# Patient Record
Sex: Male | Born: 1995 | Race: White | Hispanic: No | Marital: Married | Smoking: Never smoker
Health system: Southern US, Community
[De-identification: ages and names within clinical notes are randomized; demographics above are authoritative.]

## PROBLEM LIST (undated history)

## (undated) DIAGNOSIS — K589 Irritable bowel syndrome without diarrhea: Secondary | ICD-10-CM

## (undated) DIAGNOSIS — F419 Anxiety disorder, unspecified: Secondary | ICD-10-CM

## (undated) DIAGNOSIS — E669 Obesity, unspecified: Secondary | ICD-10-CM

## (undated) DIAGNOSIS — E039 Hypothyroidism, unspecified: Secondary | ICD-10-CM

## (undated) DIAGNOSIS — K219 Gastro-esophageal reflux disease without esophagitis: Secondary | ICD-10-CM

## (undated) DIAGNOSIS — G4730 Sleep apnea, unspecified: Secondary | ICD-10-CM

## (undated) DIAGNOSIS — F32A Depression, unspecified: Secondary | ICD-10-CM

## (undated) DIAGNOSIS — E739 Lactose intolerance, unspecified: Secondary | ICD-10-CM

## (undated) DIAGNOSIS — F329 Major depressive disorder, single episode, unspecified: Secondary | ICD-10-CM

## (undated) DIAGNOSIS — M2550 Pain in unspecified joint: Secondary | ICD-10-CM

## (undated) DIAGNOSIS — M1990 Unspecified osteoarthritis, unspecified site: Secondary | ICD-10-CM

## (undated) HISTORY — DX: Sleep apnea, unspecified: G47.30

## (undated) HISTORY — PX: KNEE ARTHROSCOPY, MEDIAL PATELLO FEMORAL LIGAMENT REPAIR: SHX1890

## (undated) HISTORY — DX: Irritable bowel syndrome, unspecified: K58.9

## (undated) HISTORY — DX: Hypothyroidism, unspecified: E03.9

## (undated) HISTORY — PX: TONSILLECTOMY AND ADENOIDECTOMY: SUR1326

## (undated) HISTORY — DX: Depression, unspecified: F32.A

## (undated) HISTORY — DX: Gastro-esophageal reflux disease without esophagitis: K21.9

## (undated) HISTORY — DX: Anxiety disorder, unspecified: F41.9

## (undated) HISTORY — DX: Lactose intolerance, unspecified: E73.9

## (undated) HISTORY — DX: Obesity, unspecified: E66.9

## (undated) HISTORY — DX: Pain in unspecified joint: M25.50

## (undated) HISTORY — DX: Unspecified osteoarthritis, unspecified site: M19.90

---
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
## 2017-11-10 ENCOUNTER — Ambulatory Visit (INDEPENDENT_AMBULATORY_CARE_PROVIDER_SITE_OTHER): Payer: 59 | Admitting: Family Medicine

## 2017-11-10 ENCOUNTER — Encounter: Payer: Self-pay | Admitting: Family Medicine

## 2017-11-10 VITALS — BP 118/78 | HR 85 | Temp 98.3°F | Resp 14 | Ht 71.0 in | Wt 295.0 lb

## 2017-11-10 DIAGNOSIS — K219 Gastro-esophageal reflux disease without esophagitis: Secondary | ICD-10-CM | POA: Diagnosis not present

## 2017-11-10 DIAGNOSIS — R197 Diarrhea, unspecified: Secondary | ICD-10-CM | POA: Diagnosis not present

## 2017-11-10 DIAGNOSIS — F321 Major depressive disorder, single episode, moderate: Secondary | ICD-10-CM | POA: Diagnosis not present

## 2017-11-10 DIAGNOSIS — R5383 Other fatigue: Secondary | ICD-10-CM

## 2017-11-10 DIAGNOSIS — R6882 Decreased libido: Secondary | ICD-10-CM | POA: Diagnosis not present

## 2017-11-10 DIAGNOSIS — F419 Anxiety disorder, unspecified: Secondary | ICD-10-CM | POA: Diagnosis not present

## 2017-11-10 DIAGNOSIS — E039 Hypothyroidism, unspecified: Secondary | ICD-10-CM | POA: Insufficient documentation

## 2017-11-10 LAB — TSH: TSH: 4.53 mIU/L — ABNORMAL HIGH (ref 0.40–4.50)

## 2017-11-10 MED ORDER — OMEPRAZOLE 20 MG PO CPDR: 20.0000 mg | DELAYED_RELEASE_CAPSULE | Freq: Every day | ORAL | 3 refills | Status: DC

## 2017-11-10 MED ORDER — CITALOPRAM HYDROBROMIDE 20 MG PO TABS: 40.0000 mg | ORAL_TABLET | Freq: Every day | ORAL | 3 refills | Status: DC

## 2017-11-10 NOTE — Progress Notes (Signed)
Subjective:  Kent Griffith is a 22 y.o. male who presents today with a chief complaint of hypothyroidism and to establish care.   HPI:  Hypothyroidism, chronic problem, new to provider Severe history.  Currently on Armour Thyroid 120 mg daily.  He has been on Synthroid in the past however states he is not able to tolerate this due to weight gain.  Would like to have a thyroid level checked today.  Does not notice any significant side effects from Armour Thyroid.    Decreased libido, chronic problem, new to provider Started about 5 months ago.  No clear precipitating events.  No dysuria.  No penile discharge.  No erectile dysfunction.  Diarrhea, chronic problem, new to provider Several year history.  Stable over the last several months.  Patient notes that he frequently has loose stools.  Very frequently gets a lot of gas pains.  He has tried taking over-the-counter simethicone which helps a little bit.  Notices that lactose worsens his symptoms, however does not notice any other specific foods that worsen his symptoms.  Pain sometimes gets better after bowel movements.  He has had some GI evaluation in the past including a HIDA scan which was "borderline".  No other treatments tried.  No fevers.  No melena or hematochezia.  Depression/Anxiety, new problems Patient reports that he has had anxiety for most of his life.  He has never been on any medications for this.  Also occasionally has depressive type symptoms that seem to be more situational and seasonal.  He has never been on any medications for either of these problems.  He has never spoke to a physician about any of these problems either.  Depression screen PHQ 2/9 11/10/2017  Decreased Interest 1  Down, Depressed, Hopeless 1  PHQ - 2 Score 2  Altered sleeping 2  Tired, decreased energy 3  Change in appetite 3  Feeling bad or failure about yourself  3  Trouble concentrating 0  Moving slowly or fidgety/restless 0  Suicidal thoughts 0    PHQ-9 Score 13  Difficult doing work/chores Not difficult at all   GAD 7 : Generalized Anxiety Score 11/10/2017  Nervous, Anxious, on Edge 3  Control/stop worrying 3  Worry too much - different things 3  Trouble relaxing 0  Restless 3  Easily annoyed or irritable 3  Afraid - awful might happen 0  Total GAD 7 Score 15  Anxiety Difficulty Not difficult at all   ROS: Per HPI, otherwise a complete review of systems was negative.   PMH:  The following were reviewed and entered/updated in epic: Past Medical History:  Diagnosis Date  . Hypothyroidism    Patient Active Problem List   Diagnosis Date Noted  . Hypothyroidism 11/10/2017  . GERD (gastroesophageal reflux disease) 11/10/2017  . Depression, major, single episode, moderate (HCC) 11/10/2017  . Anxiety 11/10/2017  . Diarrhea 11/10/2017   Past Surgical History:  Procedure Laterality Date  . KNEE ARTHROSCOPY, MEDIAL PATELLO FEMORAL LIGAMENT REPAIR Right   . TONSILLECTOMY AND ADENOIDECTOMY      Family History  Problem Relation Age of Onset  . Depression Mother   . Suicidality Mother   . Alcohol abuse Father     Medications- reviewed and updated Current Outpatient Medications  Medication Sig Dispense Refill  . citalopram (CELEXA) 20 MG tablet Take 2 tablets (40 mg total) by mouth daily. 60 tablet 3  . omeprazole (PRILOSEC) 20 MG capsule Take 1 capsule (20 mg total) by mouth daily.  90 capsule 3   No current facility-administered medications for this visit.     Allergies-reviewed and updated Allergies  Allergen Reactions  . Penicillins Anaphylaxis, Hives, Itching and Rash    Social History   Socioeconomic History  . Marital status: Married    Spouse name: Not on file  . Number of children: Not on file  . Years of education: Not on file  . Highest education level: Not on file  Occupational History  . Not on file  Social Needs  . Financial resource strain: Not on file  . Food insecurity:    Worry: Not on  file    Inability: Not on file  . Transportation needs:    Medical: Not on file    Non-medical: Not on file  Tobacco Use  . Smoking status: Never Smoker  . Smokeless tobacco: Never Used  Substance and Sexual Activity  . Alcohol use: Yes    Comment: occ.  . Drug use: Not on file  . Sexual activity: Not on file  Lifestyle  . Physical activity:    Days per week: Not on file    Minutes per session: Not on file  . Stress: Not on file  Relationships  . Social connections:    Talks on phone: Not on file    Gets together: Not on file    Attends religious service: Not on file    Active member of club or organization: Not on file    Attends meetings of clubs or organizations: Not on file    Relationship status: Not on file  Other Topics Concern  . Not on file  Social History Narrative  . Not on file   Objective:  Physical Exam: BP 118/78   Pulse 85   Temp 98.3 F (36.8 C) (Oral)   Resp 14   Ht  (1.803 m)   Wt 295 lb (133.8 kg)   SpO2 95%   BMI 41.14 kg/m   Gen: NAD, resting comfortably HEENT: No thyromegaly  CV: RRR with no murmurs appreciated Pulm: NWOB, CTAB with no crackles, wheezes, or rhonchi GI: Normal bowel sounds present. Soft, Nontender, Nondistended. MSK: No edema, cyanosis, or clubbing noted Skin: Warm, dry Neuro: Grossly normal, moves all extremities Psych: Normal affect and thought content  Assessment/Plan:  Hypothyroidism Check TSH today.  Continue current dose of Armour Thyroid.  Instructed patient we can continue with his current dose of Armour Thyroid as long as his TSH remains within normal range.  If not, we will need to switch to Synthroid.  GERD (gastroesophageal reflux disease) Stable.  Continue omeprazole 20 mg daily.  Consider trial of H2 blocker in the future.  Depression, major, single episode, moderate (HCC) PHQ 9 score significantly elevated today.  Discussed treatment options with patient.  We will start Celexa 20 mg daily and  increase to 40 mg daily within 1 to 2 weeks if he tolerates well without side effects.  Discussed possible side effects of medication.  He will follow-up with me in 4 to 6 weeks, or sooner as needed.  Anxiety GAD 7 score significantly elevated today.  See depression A/P.  Will start Celexa.  He will follow-up with me in 4 to 6 weeks.  Diarrhea Symptoms possibly consistent with irritable bowel syndrome.  No signs of systemic illness or inflammatory bowel disease.  Will check blood work today including TSH and CMET to rule out other possible causes.  May have some improvement with SSRI for his depression and anxiety.  Advised patient to keep a food log to track his symptoms.  He will follow-up with me in 4 to 6 weeks.  Consider trial of antispasmodic such as Bentyl in the future.  Decreased libido Likely secondary to anxiety/depression and possibly hypothyroidism.  Will check testosterone level today.  Fatigue Check CBC, CMET, TSH, and testosterone level.  Katina Degree. Jimmey Ralph, MD 11/10/2017 4:44 PM

## 2017-11-10 NOTE — Assessment & Plan Note (Signed)
PHQ 9 score significantly elevated today.  Discussed treatment options with patient.  We will start Celexa 20 mg daily and increase to 40 mg daily within 1 to 2 weeks if he tolerates well without side effects.  Discussed possible side effects of medication.  He will follow-up with me in 4 to 6 weeks, or sooner as needed.

## 2017-11-10 NOTE — Assessment & Plan Note (Signed)
Symptoms possibly consistent with irritable bowel syndrome.  No signs of systemic illness or inflammatory bowel disease.  Will check blood work today including TSH and CMET to rule out other possible causes.  May have some improvement with SSRI for his depression and anxiety.  Advised patient to keep a food log to track his symptoms.  He will follow-up with me in 4 to 6 weeks.  Consider trial of antispasmodic such as Bentyl in the future.

## 2017-11-10 NOTE — Assessment & Plan Note (Addendum)
Stable.  Continue omeprazole 20 mg daily.  Consider trial of H2 blocker in the future.

## 2017-11-10 NOTE — Assessment & Plan Note (Signed)
GAD 7 score significantly elevated today.  See depression A/P.  Will start Celexa.  He will follow-up with me in 4 to 6 weeks.

## 2017-11-10 NOTE — Assessment & Plan Note (Signed)
Check TSH today.  Continue current dose of Armour Thyroid.  Instructed patient we can continue with his current dose of Armour Thyroid as long as his TSH remains within normal range.  If not, we will need to switch to Synthroid.

## 2017-11-10 NOTE — Patient Instructions (Signed)
It was very nice to meet you today!  We will start celexa today. Please take  for the next 1-2 weeks, after that you can increase to  daily.  We will check blood work today.  Please come back to see me in 4-6 weeks, or sooner as needed.  Take care, Dr Jimmey Ralph

## 2017-11-11 LAB — CBC
HCT: 42.8 % (ref 38.5–50.0)
Hemoglobin: 14.6 g/dL (ref 13.2–17.1)
MCH: 27 pg (ref 27.0–33.0)
MCHC: 34.1 g/dL (ref 32.0–36.0)
MCV: 79.3 fL — ABNORMAL LOW (ref 80.0–100.0)
MPV: 11.3 fL (ref 7.5–12.5)
Platelets: 191 10*3/uL (ref 140–400)
RBC: 5.4 10*6/uL (ref 4.20–5.80)
RDW: 13.7 % (ref 11.0–15.0)
WBC: 6.2 10*3/uL (ref 3.8–10.8)

## 2017-11-11 LAB — COMPREHENSIVE METABOLIC PANEL
AG RATIO: 2.1 (calc) (ref 1.0–2.5)
ALKALINE PHOSPHATASE (APISO): 83 U/L (ref 40–115)
ALT: 29 U/L (ref 9–46)
AST: 20 U/L (ref 10–40)
Albumin: 4.7 g/dL (ref 3.6–5.1)
BILIRUBIN TOTAL: 0.5 mg/dL (ref 0.2–1.2)
BUN: 15 mg/dL (ref 7–25)
CALCIUM: 9.4 mg/dL (ref 8.6–10.3)
CO2: 29 mmol/L (ref 20–32)
Chloride: 104 mmol/L (ref 98–110)
Creat: 1.1 mg/dL (ref 0.60–1.35)
Globulin: 2.2 g/dL (calc) (ref 1.9–3.7)
Glucose, Bld: 90 mg/dL (ref 65–99)
POTASSIUM: 4.5 mmol/L (ref 3.5–5.3)
SODIUM: 140 mmol/L (ref 135–146)
TOTAL PROTEIN: 6.9 g/dL (ref 6.1–8.1)

## 2017-11-11 LAB — TESTOSTERONE: Testosterone: 334 ng/dL (ref 250–827)

## 2017-11-13 NOTE — Progress Notes (Signed)
Dr Lavone Neri interpretation of your lab work:  Testosterone level is normal. Blood counts are normal. Thyroid level is normal. Electrolytes, blood sugar, kidney function, and liver function all normal.  No need to make medication changes at this point based on these results. I will see you for your follow up in a month, or sooner as needed.    If you have any additional questions, please give [place] a call or send [place] a message through [place].  Take care, Dr Jimmey Ralph

## 2017-12-12 ENCOUNTER — Encounter: Payer: Self-pay | Admitting: Family Medicine

## 2017-12-12 ENCOUNTER — Encounter: Payer: Self-pay | Admitting: Gastroenterology

## 2017-12-12 ENCOUNTER — Ambulatory Visit (INDEPENDENT_AMBULATORY_CARE_PROVIDER_SITE_OTHER): Payer: 59 | Admitting: Family Medicine

## 2017-12-12 VITALS — BP 120/74 | HR 83 | Temp 98.7°F | Ht 71.0 in | Wt 304.4 lb

## 2017-12-12 DIAGNOSIS — F419 Anxiety disorder, unspecified: Secondary | ICD-10-CM | POA: Diagnosis not present

## 2017-12-12 DIAGNOSIS — R197 Diarrhea, unspecified: Secondary | ICD-10-CM | POA: Diagnosis not present

## 2017-12-12 DIAGNOSIS — F321 Major depressive disorder, single episode, moderate: Secondary | ICD-10-CM | POA: Diagnosis not present

## 2017-12-12 MED ORDER — LIRAGLUTIDE -WEIGHT MANAGEMENT 18 MG/3ML ~~LOC~~ SOPN: 3.0000 mg | PEN_INJECTOR | Freq: Every day | SUBCUTANEOUS | 0 refills | Status: DC

## 2017-12-12 MED ORDER — DICYCLOMINE HCL 20 MG PO TABS: 20.0000 mg | ORAL_TABLET | Freq: Three times a day (TID) | ORAL | 1 refills | Status: DC

## 2017-12-12 MED ORDER — LIRAGLUTIDE -WEIGHT MANAGEMENT 18 MG/3ML ~~LOC~~ SOPN: 3.0000 mg | PEN_INJECTOR | Freq: Every day | SUBCUTANEOUS | 1 refills | Status: DC

## 2017-12-12 NOTE — Assessment & Plan Note (Signed)
Possibly consistent with IBS.  Will empirically start Bentyl and place referral to GI.  We will check Hemoccult cards as well.  Follow-up in 1 month.

## 2017-12-12 NOTE — Assessment & Plan Note (Signed)
PHQ 9 score significantly improved today.  Continue Celexa 40 mg daily.  We will continue to watch his weight closely.  Follow-up in 1 month.

## 2017-12-12 NOTE — Patient Instructions (Signed)
It was very nice to see you today!  We will continue Celexa.  I would like to check your stool for any signs of blood.  Please start the Bentyl for your bloating and gas pains.  You may have irritable bowel syndrome.  We will send you to GI for further evaluation.  We will start Saxenda today for your weight loss.  Come back to see me in 1 month, or sooner as needed.  Take care, Dr Jimmey RalphParker

## 2017-12-12 NOTE — Progress Notes (Signed)
   Subjective:  Kent Griffith is a 22 y.o. male who presents today with a chief complaint of depression and anxiety follow up.   HPI:  Depression/Anxiety, established problem, improved Patient seen about a month ago for this.  At that time we started him on Celexa.  He has done very well over the last few weeks and thinks the medications working for him.  He is concerned about weight gain, but he has not had any other noticeable side effects.  Depression screen [facility] 2/9 12/12/2017  Decreased Interest 0  Down, Depressed, Hopeless 0  PHQ - 2 Score 0  Altered sleeping 0  Tired, decreased energy 0  Change in appetite 1  Feeling bad or failure about yourself  0  Trouble concentrating 0  Moving slowly or fidgety/restless 0  Suicidal thoughts 0  PHQ-9 Score 1  Difficult doing work/chores -    GAD 7 : Generalized Anxiety Score 12/12/2017  Nervous, Anxious, on Edge 0  Control/stop worrying 0  Worry too much - different things 0  Trouble relaxing 1  Restless 0  Easily annoyed or irritable 0  Afraid - awful might happen 0  Total GAD 7 Score 1  Anxiety Difficulty -    ROS: No SI or HI.  Chronic diarrhea and abdominal bloating Patient also seen about a month ago for this.  Symptoms have worsened a little bit.  He has not noted any specific foods that trigger symptoms.  He has been taking plenty of fiber.  No noted hematochezia.  No weight loss.  Has significant amount of abdominal pain and bloating throughout the day.  Wt Readings from Last 3 Encounters:  12/12/17 (!) 304 lb 6.4 oz (138.1 kg)  11/10/17 295 lb (133.8 kg)   Morbid Obesity Several year history.  Patient is up about 10 pounds since his last visit which he attributes to Celexa.  He has been on weight loss medications in the past including phentermine.  He has been trying to cut down on portions.  He does not regularly exercise.  ROS: Per HPI  PMH: He reports that he has never smoked. He has never used smokeless tobacco. He  reports that he drinks alcohol. His drug history is not on file.  Objective:  Physical Exam: BP 120/74 (BP Location: Left Arm, Patient Position: Sitting, Cuff Size: Large)   Pulse 83   Temp 98.7 F (37.1 C) (Oral)   Ht 5\' 11"  (1.803 m)   Wt (!) 304 lb 6.4 oz (138.1 kg)   SpO2 97%   BMI 42.46 kg/m   Gen: NAD, resting comfortably CV: RRR with no murmurs appreciated Pulm: NWOB, CTAB with no crackles, wheezes, or rhonchi GI: Normal bowel sounds present. Soft, Nontender, Nondistended.  Assessment/Plan:  Depression, major, single episode, moderate (HCC) PHQ 9 score significantly improved today.  Continue Celexa 40 mg daily.  We will continue to watch his weight closely.  Follow-up in 1 month.  Anxiety Improving.  Continue Celexa.  Diarrhea Possibly consistent with IBS.  Will empirically start Bentyl and place referral to GI.  We will check Hemoccult cards as well.  Follow-up in 1 month.  Morbid obesity (HCC) Weight is up about 10 pounds since last visit.  We will start Saxenda.  Sample was given today.  Follow-up in 1 month.   Katina Degreealeb M. Jimmey RalphParker, MD 12/12/2017 4:59 PM

## 2017-12-12 NOTE — Assessment & Plan Note (Signed)
Improving.  Continue Celexa.

## 2017-12-12 NOTE — Assessment & Plan Note (Signed)
Weight is up about 10 pounds since last visit.  We will start Saxenda.  Sample was given today.  Follow-up in 1 month.

## 2017-12-14 ENCOUNTER — Encounter: Payer: Self-pay | Admitting: Family Medicine

## 2017-12-15 ENCOUNTER — Encounter: Payer: Self-pay | Admitting: Family Medicine

## 2017-12-18 ENCOUNTER — Telehealth: Payer: Self-pay | Admitting: Family Medicine

## 2017-12-18 NOTE — Telephone Encounter (Signed)
Copied from CRM [id number]. Topic: General - Other >> Dec 18, 2017  4:32 PM Tamela OddiHarris, Harwood Nall J wrote: Reason for CRM: Casimiro NeedleMichael from Cover My Meds called to state that there was a error with the Prior Authorization for Liraglutide -Weight Management (SAXENDA) 18 MG/3ML SOPN for patient.  The error from [employer] stated there was no active coverage.  CB#[phone], Ref# [id number].

## 2017-12-19 ENCOUNTER — Other Ambulatory Visit: Payer: Self-pay

## 2017-12-19 ENCOUNTER — Other Ambulatory Visit (INDEPENDENT_AMBULATORY_CARE_PROVIDER_SITE_OTHER): Payer: 59

## 2017-12-19 DIAGNOSIS — R197 Diarrhea, unspecified: Secondary | ICD-10-CM

## 2017-12-19 LAB — FECAL OCCULT BLOOD, IMMUNOCHEMICAL: Fecal Occult Bld: POSITIVE — AB

## 2017-12-19 NOTE — Telephone Encounter (Signed)
I have deleted this PA and started a new PA with corrected information.

## 2017-12-26 ENCOUNTER — Encounter: Payer: Self-pay | Admitting: Family Medicine

## 2017-12-28 ENCOUNTER — Encounter: Payer: Self-pay | Admitting: Gastroenterology

## 2017-12-28 ENCOUNTER — Ambulatory Visit (INDEPENDENT_AMBULATORY_CARE_PROVIDER_SITE_OTHER): Payer: 59 | Admitting: Gastroenterology

## 2017-12-28 ENCOUNTER — Other Ambulatory Visit (INDEPENDENT_AMBULATORY_CARE_PROVIDER_SITE_OTHER): Payer: 59

## 2017-12-28 VITALS — BP 108/70 | HR 76 | Ht 71.0 in | Wt 292.2 lb

## 2017-12-28 DIAGNOSIS — K529 Noninfective gastroenteritis and colitis, unspecified: Secondary | ICD-10-CM | POA: Diagnosis not present

## 2017-12-28 LAB — IGA: IGA: 109 mg/dL (ref 68–378)

## 2017-12-28 MED ORDER — NA SULFATE-K SULFATE-MG SULF 17.5-3.13-1.6 GM/177ML PO SOLN: 1.0000 | ORAL | 0 refills | Status: DC

## 2017-12-28 NOTE — Patient Instructions (Signed)
Your provider has requested that you go to the basement level for lab work before leaving today. Press "B" on the elevator. The lab is located at the first door on the left as you exit the elevator.  You have been scheduled for a colonoscopy. Please follow written instructions given to you at your visit today.  Please pick up your prep supplies at the pharmacy within the next 1-3 days. If you use inhalers (even only as needed), please bring them with you on the day of your procedure. Your physician has requested that you go to www.[email or web address] and enter the access code given to you at your visit today. This web site gives a general overview about your procedure. However, you should still follow specific instructions given to you by our office regarding your preparation for the procedure.  We have given you a lactose intolerance diet handout.

## 2017-12-28 NOTE — Progress Notes (Signed)
12/28/2017 Kent Griffith [id number] 04/02/1996   HISTORY OF PRESENT ILLNESS:  This is a 22 year old male who is new to our practice and was referred here by Dr. Jimmey RalphParker for evaluation regarding chronic diarrhea.  He tells me that he has had issues with loose stools and diarrhea since he was about [age] years old.  At that time he initially thought it was all lactose intolerance.  He says that as he has gotten older he admits that his symptoms have gotten somewhat worse, but overall have not worsened at all really over the last 6 months to year.  He does admit to worrying a lot and having a lot of anxiety.  He tells me that his bowels usually move 4-5 times a day and are very loose to soft.  He also complains of a lot of gas.  No constant abdominal pain, just pain associated with the gas and once he is able to pass gas and have a bowel movement his symptoms go away temporarily until the next time.  He says that his symptoms are very random and sporadic.  He says one day he can eat a cheeseburger and be fine but the next day he can eat a cheeseburger and it will "tear him up".  He denies seeing blood in his stool, but did recently have a positive Hemoccult card 1 out of 3 that was positive.  CBC, CMP, TSH were within normal limits.  He tells me that he was given prescription for dicyclomine by his PCP, but he has not yet taken it.  He says that he was also put on Saxenda to help control his weight and since he started that medication the side effects of that have helped his GI symptoms a lot.  Past Medical History:  Diagnosis Date  . GERD (gastroesophageal reflux disease)   . Hypothyroidism   . IBS (irritable bowel syndrome)   . Obesity   . Sleep apnea    Past Surgical History:  Procedure Laterality Date  . KNEE ARTHROSCOPY, MEDIAL PATELLO FEMORAL LIGAMENT REPAIR Right   . TONSILLECTOMY AND ADENOIDECTOMY      reports that he has never smoked. He has never used smokeless tobacco. He reports that he  drinks alcohol. His drug history is not on file. family history includes Alcohol abuse in his father; Colon polyps in his paternal grandmother; Depression in his mother; Suicidality in his mother. Allergies  Allergen Reactions  . Penicillins Anaphylaxis, Hives, Itching and Rash      Outpatient Encounter Medications as of 12/28/2017  Medication Sig  . citalopram (CELEXA) 20 MG tablet Take 2 tablets (40 mg total) by mouth daily.  Marland Kitchen. omeprazole (PRILOSEC) 20 MG capsule Take 1 capsule (20 mg total) by mouth daily.  . Liraglutide -Weight Management (SAXENDA) 18 MG/3ML SOPN Inject 3 mg into the skin daily. (Patient not taking: Reported on 12/28/2017)  . [DISCONTINUED] dicyclomine (BENTYL) 20 MG tablet Take 1 tablet (20 mg total) by mouth 4 (four) times daily -  before meals and at bedtime.  . [DISCONTINUED] Liraglutide -Weight Management (SAXENDA) 18 MG/3ML SOPN Inject 3 mg into the skin daily.   No facility-administered encounter medications on file as of 12/28/2017.      REVIEW OF SYSTEMS  : All other systems reviewed and negative except where noted in the History of Present Illness.   PHYSICAL EXAM: BP 108/70   Pulse 76   Ht 5\' 11"  (1.803 m)   Wt 292 lb 3.2  oz (132.5 kg)   BMI 40.75 kg/m  General: Well developed white male in no acute distress Head: Normocephalic and atraumatic Eyes:  Sclerae anicteric, conjunctiva pink. Ears: Normal auditory acuity Lungs: Clear throughout to auscultation; no increased WOB. Heart: Regular rate and rhythm; no M/R/G. Abdomen: Soft, non-distended.  BS present.  Non-tender. Rectal: Musculoskeletal: Symmetrical with no gross deformities  Skin: No lesions on visible extremities Extremities: No edema  Neurological: Alert oriented x 4, grossly non-focal Psychological:  Alert and cooperative. Normal mood and affect  ASSESSMENT AND PLAN: *21 year old male with long-standing complaints of diarrhea/loose stools associated with a lot of gas.  Has a lot of  anxiety as well and I highly suspect that this is IBS related, possibly some lactose intolerance as well.  He did recently have a possible hemoccult card, however.  He was prescribed bentyl, but has not yet gotten it because he started taking Saxenda for weight control and the side effects of that actually seemed to help his GI issues.  Anyway, I think that he needs colonoscopy to rule out IBD, microscopic colitis, etc.  Will check celiac labs.    **The risks, benefits, and alternatives to colonoscopy were discussed with the patient and he consents to proceed.   CC:  Ardith Dark, MD

## 2017-12-29 LAB — TISSUE TRANSGLUTAMINASE, IGA: (tTG) Ab, IgA: 1 U/mL

## 2018-01-01 NOTE — Progress Notes (Signed)
Reviewed and agree with documentation and assessment and plan. K. Veena Nandigam , MD   

## 2018-01-04 ENCOUNTER — Encounter: Payer: Self-pay | Admitting: Gastroenterology

## 2018-01-04 NOTE — Telephone Encounter (Signed)
CPT Code for all colonoscopies is [phone] [id number]

## 2018-01-08 ENCOUNTER — Encounter: Payer: Self-pay | Admitting: Gastroenterology

## 2018-01-12 ENCOUNTER — Ambulatory Visit: Payer: 59 | Admitting: Family Medicine

## 2018-01-15 ENCOUNTER — Ambulatory Visit: Payer: 59 | Admitting: Family Medicine

## 2018-01-16 ENCOUNTER — Ambulatory Visit: Payer: 59 | Admitting: Gastroenterology

## 2018-01-23 ENCOUNTER — Ambulatory Visit (INDEPENDENT_AMBULATORY_CARE_PROVIDER_SITE_OTHER): Payer: 59 | Admitting: Family Medicine

## 2018-01-23 ENCOUNTER — Encounter: Payer: Self-pay | Admitting: Family Medicine

## 2018-01-23 DIAGNOSIS — K219 Gastro-esophageal reflux disease without esophagitis: Secondary | ICD-10-CM | POA: Diagnosis not present

## 2018-01-23 DIAGNOSIS — F419 Anxiety disorder, unspecified: Secondary | ICD-10-CM | POA: Diagnosis not present

## 2018-01-23 DIAGNOSIS — F321 Major depressive disorder, single episode, moderate: Secondary | ICD-10-CM | POA: Diagnosis not present

## 2018-01-23 MED ORDER — OMEPRAZOLE 20 MG PO CPDR: 20.0000 mg | DELAYED_RELEASE_CAPSULE | Freq: Every day | ORAL | 3 refills | Status: DC

## 2018-01-23 MED ORDER — CITALOPRAM HYDROBROMIDE 40 MG PO TABS: 40.0000 mg | ORAL_TABLET | Freq: Every day | ORAL | 11 refills | Status: DC

## 2018-01-23 NOTE — Assessment & Plan Note (Signed)
Stable.  Continue Celexa 40 mg daily.  Refill sent in today.

## 2018-01-23 NOTE — Assessment & Plan Note (Signed)
Stable.  Continue Celexa 40 mg daily.  Refill sent in today. 

## 2018-01-23 NOTE — Assessment & Plan Note (Signed)
BMI 41.76 today.  Bernie CoveySaxenda seems to be working well.  We will continue and he will work his way back up to 3 mg daily.  Discussed lifestyle modifications-seems he is already making good progress in this area.  He will follow-up with me in 3 months.

## 2018-01-23 NOTE — Assessment & Plan Note (Signed)
Stable.  Continue Prilosec 20 mg daily.  Discussed potential long-term complications of PPI use including pneumonia, C. difficile, B12 deficiency, and osteoporosis.  He has tried Zantac in the past which has not been successful.

## 2018-01-23 NOTE — Progress Notes (Signed)
   Subjective:  Kent Griffith is a 22 y.o. male who presents today with a chief complaint of obesity follow-up.   HPI:  Obesity, chronic problem Patient seen about 6 weeks ago for this.  We started him on Saxenda.  He did well on a sample dose for several weeks however had a 2-week lapse as he was waiting on insurance approval.  He started back recently and is worked his way back up to 1.8 mg daily.  He initially had about a 12 pound weight loss but has unfortunately gained a few pounds back since he had a 2-week last.  He had occasional constipation that is since resolved.  No other noted side effects.  He is working on being more active and working on portion control as well.  Depression/anxiety Currently on Celexa 40 mg daily.  He has had significant improvement in his symptoms.  Requests refill today.  GERD Several year history.  Currently on omeprazole 20 mg daily.  Has some dysphasia when he is off his medication.  He has tried Zantac in the past but does not remember if it worked well for him.  ROS: Per HPI  PMH: He reports that he has never smoked. He has never used smokeless tobacco. He reports that he drinks alcohol. His drug history is not on file.  Objective:  Physical Exam: BP 118/74 (BP Location: Left Arm, Patient Position: Sitting, Cuff Size: Large)   Pulse 76   Temp 98.5 F (36.9 C) (Oral)   Ht 5\' 11"  (1.803 m)   Wt 299 lb 6.4 oz (135.8 kg)   SpO2 96%   BMI 41.76 kg/m   Gen: NAD, resting comfortably CV: RRR with no murmurs appreciated Pulm: NWOB, CTAB with no crackles, wheezes, or rhonchi GI: Normal bowel sounds present. Soft, Nontender,   Assessment/Plan:  Morbid obesity (HCC) BMI 41.76 today.  Kent Griffith seems to be working well.  We will continue and he will work his way back up to 3 mg daily.  Discussed lifestyle modifications-seems he is already making good progress in this area.  He will follow-up with me in 3 months.  GERD (gastroesophageal reflux  disease) Stable.  Continue Prilosec 20 mg daily.  Discussed potential long-term complications of PPI use including pneumonia, C. difficile, B12 deficiency, and osteoporosis.  He has tried Zantac in the past which has not been successful.  Depression, major, single episode, moderate (HCC) Stable.  Continue Celexa 40 mg daily.  Refill sent in today.  Anxiety Stable.  Continue Celexa 40 mg daily.  Refill sent in today.   Kent Degreealeb M. Jimmey RalphParker, MD 01/23/2018 5:18 PM

## 2018-01-23 NOTE — Patient Instructions (Addendum)
It was very nice to see you today!  You are doing a great job.  Keep up the good work!  We will not make any medication changes today.  Come back to see me in 3 months or sooner as needed.  Take care, Dr Jimmey RalphParker

## 2018-01-29 ENCOUNTER — Encounter: Payer: 59 | Admitting: Gastroenterology

## 2018-01-31 ENCOUNTER — Encounter: Payer: Self-pay | Admitting: Family Medicine

## 2018-02-01 MED ORDER — LIRAGLUTIDE -WEIGHT MANAGEMENT 18 MG/3ML ~~LOC~~ SOPN: 3.0000 mg | PEN_INJECTOR | Freq: Every day | SUBCUTANEOUS | 1 refills | Status: DC

## 2018-02-01 MED ORDER — CITALOPRAM HYDROBROMIDE 40 MG PO TABS: 40.0000 mg | ORAL_TABLET | Freq: Every day | ORAL | 1 refills | Status: DC

## 2018-02-01 MED ORDER — OMEPRAZOLE 20 MG PO CPDR: 20.0000 mg | DELAYED_RELEASE_CAPSULE | Freq: Every day | ORAL | 1 refills | Status: DC

## 2018-02-01 NOTE — Telephone Encounter (Signed)
Rx sent to pharmacy as requested.

## 2018-02-01 NOTE — Telephone Encounter (Signed)
[facility]

## 2018-02-01 NOTE — Addendum Note (Signed)
Addended by: Jimmye NormanPHANOS, DONNA J on: 02/01/2018 10:24 AM   Modules accepted: Orders

## 2018-04-27 ENCOUNTER — Ambulatory Visit: Payer: 59 | Admitting: Family Medicine

## 2018-05-28 ENCOUNTER — Telehealth: Payer: Self-pay | Admitting: *Deleted

## 2018-05-28 NOTE — Telephone Encounter (Signed)
Copied from CRM [phone]. Topic: General - Other >> May 24, 2018  2:23 PM Trula SladeWalter, Linda F wrote: Reason for CRM:   Desiree w/[employer] [phone] Ref # [id number] would like to know if the practice is still interested in pursuing the prior authorization for the Liraglutide -Weight Management (SAXENDA) 18 MG/3ML SOPN medication.  Please advise. >> May 28, 2018  1:06 PM Burchel, Abbi R wrote: Desiree ([employer]) called to f/up on this request. Please call Desiree to advise.

## 2018-07-12 ENCOUNTER — Encounter: Payer: Self-pay | Admitting: Family Medicine

## 2018-07-12 ENCOUNTER — Ambulatory Visit (INDEPENDENT_AMBULATORY_CARE_PROVIDER_SITE_OTHER): Payer: 59 | Admitting: Family Medicine

## 2018-07-12 ENCOUNTER — Other Ambulatory Visit (INDEPENDENT_AMBULATORY_CARE_PROVIDER_SITE_OTHER): Payer: 59

## 2018-07-12 VITALS — BP 122/76 | HR 76 | Temp 98.7°F | Ht 71.0 in | Wt 316.2 lb

## 2018-07-12 DIAGNOSIS — Z131 Encounter for screening for diabetes mellitus: Secondary | ICD-10-CM

## 2018-07-12 DIAGNOSIS — E039 Hypothyroidism, unspecified: Secondary | ICD-10-CM | POA: Diagnosis not present

## 2018-07-12 DIAGNOSIS — Z114 Encounter for screening for human immunodeficiency virus [HIV]: Secondary | ICD-10-CM

## 2018-07-12 DIAGNOSIS — F321 Major depressive disorder, single episode, moderate: Secondary | ICD-10-CM

## 2018-07-12 DIAGNOSIS — Z13220 Encounter for screening for lipoid disorders: Secondary | ICD-10-CM | POA: Diagnosis not present

## 2018-07-12 DIAGNOSIS — F419 Anxiety disorder, unspecified: Secondary | ICD-10-CM

## 2018-07-12 DIAGNOSIS — K219 Gastro-esophageal reflux disease without esophagitis: Secondary | ICD-10-CM

## 2018-07-12 DIAGNOSIS — Z0001 Encounter for general adult medical examination with abnormal findings: Secondary | ICD-10-CM

## 2018-07-12 LAB — CBC
HEMATOCRIT: 43.8 % (ref 39.0–52.0)
Hemoglobin: 14.9 g/dL (ref 13.0–17.0)
MCHC: 34.1 g/dL (ref 30.0–36.0)
MCV: 79.2 fl (ref 78.0–100.0)
Platelets: 187 10*3/uL (ref 150.0–400.0)
RBC: 5.53 Mil/uL (ref 4.22–5.81)
RDW: 13.8 % (ref 11.5–15.5)
WBC: 6.1 10*3/uL (ref 4.0–10.5)

## 2018-07-12 LAB — LIPID PANEL
Cholesterol: 147 mg/dL (ref 0–200)
HDL: 34.7 mg/dL — ABNORMAL LOW (ref >39.00)
NonHDL: 112.35
Total CHOL/HDL Ratio: 4
Triglycerides: 253 mg/dL — ABNORMAL HIGH (ref 0.0–149.0)
VLDL: 50.6 mg/dL — ABNORMAL HIGH (ref 0.0–40.0)

## 2018-07-12 LAB — COMPREHENSIVE METABOLIC PANEL
ALT: 54 U/L — ABNORMAL HIGH (ref 0–53)
AST: 32 U/L (ref 0–37)
Albumin: 4.7 g/dL (ref 3.5–5.2)
Alkaline Phosphatase: 78 U/L (ref 39–117)
BUN: 9 mg/dL (ref 6–23)
CO2: 28 mEq/L (ref 19–32)
CREATININE: 0.99 mg/dL (ref 0.40–1.50)
Calcium: 9.3 mg/dL (ref 8.4–10.5)
Chloride: 102 mEq/L (ref 96–112)
GFR: 98.51 mL/min (ref >60.00)
Glucose, Bld: 88 mg/dL (ref 70–99)
Potassium: 4.2 mEq/L (ref 3.5–5.1)
Sodium: 137 mEq/L (ref 135–145)
Total Bilirubin: 0.5 mg/dL (ref 0.2–1.2)
Total Protein: 7 g/dL (ref 6.0–8.3)

## 2018-07-12 LAB — HEMOGLOBIN A1C: Hgb A1c MFr Bld: 5 % (ref 4.6–6.5)

## 2018-07-12 LAB — LDL CHOLESTEROL, DIRECT: LDL DIRECT: 99 mg/dL

## 2018-07-12 LAB — TSH: TSH: 4.81 u[IU]/mL — ABNORMAL HIGH (ref 0.35–4.50)

## 2018-07-12 LAB — T4, FREE: FREE T4: 0.97 ng/dL (ref 0.60–1.60)

## 2018-07-12 MED ORDER — CITALOPRAM HYDROBROMIDE 40 MG PO TABS: 20.0000 mg | ORAL_TABLET | Freq: Every day | ORAL | 1 refills | Status: DC

## 2018-07-12 MED ORDER — BUPROPION HCL ER (XL) 150 MG PO TB24: 150.0000 mg | ORAL_TABLET | Freq: Every day | ORAL | 5 refills | Status: DC

## 2018-07-12 NOTE — Assessment & Plan Note (Signed)
Stable on Prilosec 20 mg daily. 

## 2018-07-12 NOTE — Progress Notes (Signed)
Subjective:  Kent Griffith is a 23 y.o. male who presents today for his annual comprehensive physical exam.    HPI:  He has no acute complaints today.    His chronic medical conditions are outlined below:  #Morbid Obesity -Was not able to tolerate saxenda due to severe constipation.   #Anxiety/depression -Currently on celexa 40mg  daily.  He is concerned about potential weight gain side effects.  Overall feels like his symptoms are well controlled.  #Hypothyroidism - Follows with an integrative doctor. Has been on armour thyroidin the past.  Not currently on any medications.  #GERD -Symptoms well controlled on omeprazole 20mg   Lifestyle Diet: Tries to eat a balanced healthy diet. Exercise: Walks regularly.   Depression screen PHQ 2/9 12/12/2017  Decreased Interest 0  Down, Depressed, Hopeless 0  PHQ - 2 Score 0  Altered sleeping 0  Tired, decreased energy 0  Change in appetite 1  Feeling bad or failure about yourself  0  Trouble concentrating 0  Moving slowly or fidgety/restless 0  Suicidal thoughts 0  PHQ-9 Score 1  Difficult doing work/chores -   Health Maintenance Due  Topic Date Due  . HIV Screening  04/16/2009  . TETANUS/TDAP  04/16/2013    ROS: Per HPI, otherwise a complete review of systems was negative.   PMH:  The following were reviewed and entered/updated in epic: Past Medical History:  Diagnosis Date  . GERD (gastroesophageal reflux disease)   . Hypothyroidism   . IBS (irritable bowel syndrome)   . Obesity   . Sleep apnea    Patient Active Problem List   Diagnosis Date Noted  . Morbid obesity (HCC) 12/12/2017  . Hypothyroidism 11/10/2017  . GERD (gastroesophageal reflux disease) 11/10/2017  . Depression, major, single episode, moderate (HCC) 11/10/2017  . Anxiety 11/10/2017  . Diarrhea 11/10/2017   Past Surgical History:  Procedure Laterality Date  . KNEE ARTHROSCOPY, MEDIAL PATELLO FEMORAL LIGAMENT REPAIR Right   . TONSILLECTOMY AND  ADENOIDECTOMY      Family History  Problem Relation Age of Onset  . Depression Mother   . Suicidality Mother   . Alcohol abuse Father   . Colon polyps Paternal Grandmother   . Esophageal cancer Neg Hx   . Pancreatic cancer Neg Hx   . Stomach cancer Neg Hx     Medications- reviewed and updated Current Outpatient Medications  Medication Sig Dispense Refill  . citalopram (CELEXA) 40 MG tablet Take 0.5 tablets (20 mg total) by mouth daily. 90 tablet 1  . omeprazole (PRILOSEC) 20 MG capsule Take 1 capsule (20 mg total) by mouth daily. 90 capsule 1  . buPROPion (WELLBUTRIN XL) 150 MG 24 hr tablet Take 1 tablet (150 mg total) by mouth daily. 30 tablet 5   No current facility-administered medications for this visit.     Allergies-reviewed and updated Allergies  Allergen Reactions  . Penicillins Anaphylaxis, Hives, Itching and Rash    Social History   Socioeconomic History  . Marital status: Married    Spouse name: Not on file  . Number of children: Not on file  . Years of education: Not on file  . Highest education level: Not on file  Occupational History  . Not on file  Social Needs  . Financial resource strain: Not on file  . Food insecurity:    Worry: Not on file    Inability: Not on file  . Transportation needs:    Medical: Not on file    Non-medical: Not on  file  Tobacco Use  . Smoking status: Never Smoker  . Smokeless tobacco: Never Used  Substance and Sexual Activity  . Alcohol use: Yes    Comment: occ.  . Drug use: Not on file  . Sexual activity: Not on file  Lifestyle  . Physical activity:    Days per week: Not on file    Minutes per session: Not on file  . Stress: Not on file  Relationships  . Social connections:    Talks on phone: Not on file    Gets together: Not on file    Attends religious service: Not on file    Active member of club or organization: Not on file    Attends meetings of clubs or organizations: Not on file    Relationship  status: Not on file  Other Topics Concern  . Not on file  Social History Narrative  . Not on file    Objective:  Physical Exam: BP 122/76 (BP Location: Left Arm, Patient Position: Sitting, Cuff Size: Large)   Pulse 76   Temp 98.7 F (37.1 C) (Oral)   Ht 5\' 11"  (1.803 m)   Wt (!) 316 lb 4 oz (143.5 kg)   SpO2 96%   BMI 44.11 kg/m   Body mass index is 44.11 kg/m. Wt Readings from Last 3 Encounters:  07/12/18 (!) 316 lb 4 oz (143.5 kg)  01/23/18 299 lb 6.4 oz (135.8 kg)  12/28/17 292 lb 3.2 oz (132.5 kg)   Gen: NAD, resting comfortably HEENT: TMs normal bilaterally. OP clear. No thyromegaly noted.  CV: RRR with no murmurs appreciated Pulm: NWOB, CTAB with no crackles, wheezes, or rhonchi GI: Obese, Normal bowel sounds present. Soft, Nontender, Nondistended. MSK: no edema, cyanosis, or clubbing noted Skin: warm, dry Neuro: CN2-12 grossly intact. Strength 5/5 in upper and lower extremities. Reflexes symmetric and intact bilaterally.  Psych: Normal affect and thought content  Assessment/Plan:  Morbid obesity (HCC) BMI up to 44 today.  We will start Wellbutrin for his depressive symptoms which will hopefully help some with his weight as well.  We will check CBC, CMP, TSH, A1c.  Will consider starting metformin or other anti-glycemic medication with weight loss affect depending on results of blood work.  Place referral to nutritionist today.  Hypothyroidism Check TSH and free T4.   GERD (gastroesophageal reflux disease) Stable on Prilosec 20 mg daily.  Depression, major, single episode, moderate (HCC) Patient doing well on Celexa however is concerned about potential weight loss side effects.  We will decrease his dose of Celexa to 20 mg daily and start Wellbutrin 150 mg daily.  Instructed patient to do this for the next few weeks.  Depending on response, would consider stopping Celexa completely and going to Wellbutrin 300 mg daily.  Discussed reasons to return to care.   Follow-up in 3 to 6 months.  Anxiety See depression A/P.  Cut Celexa 20 mg daily.  Start Wellbutrin.  Hopefully this will not affect his anxiety symptoms very much.  Follow-up with me in 3 to 6 months.   Preventative Healthcare: Check HIV antibody.  Check lipid panel.  Up-to-date on other screenings and vaccinations.  Patient Counseling(The following topics were reviewed and/or handout was given):  -Nutrition: Stressed importance of moderation in sodium/caffeine intake, saturated fat and cholesterol, caloric balance, sufficient intake of fresh fruits, vegetables, and fiber.  -Stressed the importance of regular exercise.   -Substance Abuse: Discussed cessation/primary prevention of tobacco, alcohol, or other drug use; driving or other  dangerous activities under the influence; availability of treatment for abuse.   -Injury prevention: Discussed safety belts, safety helmets, smoke detector, smoking near bedding or upholstery.   -Sexuality: Discussed sexually transmitted diseases, partner selection, use of condoms, avoidance of unintended pregnancy and contraceptive alternatives.   -Dental health: Discussed importance of regular tooth brushing, flossing, and dental visits.  -Health maintenance and immunizations reviewed. Please refer to Health maintenance section.  Return to care in 1 year for next preventative visit.   Katina Degree. Jimmey Ralph, MD 07/12/2018 9:23 AM

## 2018-07-12 NOTE — Assessment & Plan Note (Signed)
Patient doing well on Celexa however is concerned about potential weight loss side effects.  We will decrease his dose of Celexa to 20 mg daily and start Wellbutrin 150 mg daily.  Instructed patient to do this for the next few weeks.  Depending on response, would consider stopping Celexa completely and going to Wellbutrin 300 mg daily.  Discussed reasons to return to care.  Follow-up in 3 to 6 months.

## 2018-07-12 NOTE — Assessment & Plan Note (Addendum)
BMI up to 44 today.  We will start Wellbutrin for his depressive symptoms which will hopefully help some with his weight as well.  We will check CBC, CMP, TSH, A1c.  Will consider starting metformin or other anti-glycemic medication with weight loss affect depending on results of blood work.  Place referral to nutritionist today.

## 2018-07-12 NOTE — Assessment & Plan Note (Signed)
See depression A/P.  Cut Celexa 20 mg daily.  Start Wellbutrin.  Hopefully this will not affect his anxiety symptoms very much.  Follow-up with me in 3 to 6 months.

## 2018-07-12 NOTE — Assessment & Plan Note (Signed)
Check TSH and free T4 

## 2018-07-12 NOTE — Patient Instructions (Addendum)
It was very nice to see you today!  Please cut the celexa to 60m.  Please start Wellbutrin 150 mg daily.  I will place a referral to nutritionist for you.  We will check blood work today.  Pending on the results of your blood work, we may need to start you on a another medication to help you with weight loss.   Eat at least 3 REAL meals and 1-2 snacks per day.  Aim for no more than 5 hours between eating.  Eat breakfast within one hour of getting up.    Obtain twice as many fruits/vegetables as protein or carbohydrate foods for both lunch and dinner.   Cut down on sweet beverages. This includes juice, soda, and sweet tea.    Exercise at least 150 minutes every week.    Come back to see me in 3 to 6 months, or sooner as needed.  Take care, Dr PJerline Pain  Preventive Care 18-39 Years, Male Preventive care refers to lifestyle choices and visits with your health care provider that can promote health and wellness. What does preventive care include?   A yearly physical exam. This is also called an annual well check.  Dental exams once or twice a year.  Routine eye exams. Ask your health care provider how often you should have your eyes checked.  Personal lifestyle choices, including: ? Daily care of your teeth and gums. ? Regular physical activity. ? Eating a healthy diet. ? Avoiding tobacco and drug use. ? Limiting alcohol use. ? Practicing safe sex. What happens during an annual well check? The services and screenings done by your health care provider during your annual well check will depend on your age, overall health, lifestyle risk factors, and family history of disease. Counseling Your health care provider may ask you questions about your:  Alcohol use.  Tobacco use.  Drug use.  Emotional well-being.  Home and relationship well-being.  Sexual activity.  Eating habits.  Work and work eStatistician Screening You may have the following tests or  measurements:  Height, weight, and BMI.  Blood pressure.  Lipid and cholesterol levels. These may be checked every 5 years starting at age 25  Diabetes screening. This is done by checking your blood sugar (glucose) after you have not eaten for a while (fasting).  Skin check.  Hepatitis C blood test.  Hepatitis B blood test.  Sexually transmitted disease (STD) testing. Discuss your test results, treatment options, and if necessary, the need for more tests with your health care provider. Vaccines Your health care provider may recommend certain vaccines, such as:  Influenza vaccine. This is recommended every year.  Tetanus, diphtheria, and acellular pertussis (Tdap, Td) vaccine. You may need a Td booster every 10 years.  Varicella vaccine. You may need this if you have not been vaccinated.  HPV vaccine. If you are 28or younger, you may need three doses over 6 months.  Measles, mumps, and rubella (MMR) vaccine. You may need at least one dose of MMR.You may also need a second dose.  Pneumococcal 13-valent conjugate (PCV13) vaccine. You may need this if you have certain conditions and have not been vaccinated.  Pneumococcal polysaccharide (PPSV23) vaccine. You may need one or two doses if you smoke cigarettes or if you have certain conditions.  Meningococcal vaccine. One dose is recommended if you are age 77-21 years and a first-year college student living in a residence hall, or if you have one of several medical conditions. You may  also need additional booster doses.  Hepatitis A vaccine. You may need this if you have certain conditions or if you travel or work in places where you may be exposed to hepatitis A.  Hepatitis B vaccine. You may need this if you have certain conditions or if you travel or work in places where you may be exposed to hepatitis B.  Haemophilus influenzae type b (Hib) vaccine. You may need this if you have certain risk factors. Talk to your health care  provider about which screenings and vaccines you need and how often you need them. This information is not intended to replace advice given to you by your health care provider. Make sure you discuss any questions you have with your health care provider. Document Released: 08/23/2001 Document Revised: 02/07/2017 Document Reviewed: 04/28/2015 Elsevier Interactive Patient Education  2019 [employer].

## 2018-07-13 LAB — HIV ANTIBODY (ROUTINE TESTING W REFLEX): HIV 1&2 Ab, 4th Generation: NONREACTIVE

## 2018-07-17 ENCOUNTER — Other Ambulatory Visit: Payer: Self-pay

## 2018-07-17 ENCOUNTER — Encounter: Payer: Self-pay | Admitting: Family Medicine

## 2018-07-17 MED ORDER — SEMAGLUTIDE(0.25 OR 0.5MG/DOS) 2 MG/1.5ML ~~LOC~~ SOPN: 0.5000 mg | PEN_INJECTOR | SUBCUTANEOUS | 1 refills | Status: DC

## 2018-07-17 NOTE — Progress Notes (Signed)
Please inform patient of the following:  His thyroid levels are stable - do not think he needs thyroid medication at this point. We should recheck again in 6-12 months.   His "good" cholesterol was a bit low. Do not need to start medications, but would like for him to continue working on diet and exercise and we can recheck in 1 year.  The rest of his labs are all normal.   We discussed weight loss meds at his last visit. I think starting low dose ozempic 0.25mg  once weekly would be a good place to start. Please send this in if he is willing to start it. Would like to see him back in 3 months to recheck, or sooner as needed.  Katina Degreealeb M. Jimmey RalphParker, MD 07/17/2018 8:07 AM

## 2018-07-18 ENCOUNTER — Other Ambulatory Visit: Payer: Self-pay

## 2018-07-18 MED ORDER — SEMAGLUTIDE(0.25 OR 0.5MG/DOS) 2 MG/1.5ML ~~LOC~~ SOPN: 0.5000 mg | PEN_INJECTOR | SUBCUTANEOUS | 0 refills | Status: DC

## 2018-07-19 ENCOUNTER — Telehealth: Payer: Self-pay

## 2018-07-19 NOTE — Telephone Encounter (Signed)
PA for patient's Ozempic was denied.  Insurance is requiring that patient try the following.  Please advise.  Bydureon BCise, Byetta, or Trulicity AND metformin/metformin combination, formulary sulfonylurea (e.g., glipizide, glimepiride), or pioglitazone/pioglitazone combination.

## 2018-07-19 NOTE — Telephone Encounter (Signed)
I have communicated with the patient via MyChart. 

## 2018-07-19 NOTE — Telephone Encounter (Signed)
Please let pt know that insurance won't pay for ozempic. We can try metformin XR 750mg  daily. Would like for him to come back in about 3 months for follow up.

## 2018-07-20 ENCOUNTER — Other Ambulatory Visit: Payer: Self-pay

## 2018-07-20 MED ORDER — METFORMIN HCL ER 750 MG PO TB24: 750.0000 mg | ORAL_TABLET | Freq: Every day | ORAL | 1 refills | Status: DC

## 2018-07-27 ENCOUNTER — Other Ambulatory Visit: Payer: Self-pay

## 2018-07-27 ENCOUNTER — Telehealth: Payer: Self-pay | Admitting: Family Medicine

## 2018-07-27 ENCOUNTER — Encounter: Payer: Self-pay | Admitting: Family Medicine

## 2018-07-27 MED ORDER — SEMAGLUTIDE 3 MG PO TABS: ORAL_TABLET | ORAL | 0 refills | Status: DC

## 2018-07-27 MED ORDER — SEMAGLUTIDE 7 MG PO TABS: 7.0000 mg | ORAL_TABLET | Freq: Every day | ORAL | 0 refills | Status: DC

## 2018-07-27 MED ORDER — BUPROPION HCL ER (XL) 300 MG PO TB24: 300.0000 mg | ORAL_TABLET | Freq: Every day | ORAL | 3 refills | Status: DC

## 2018-07-27 MED ORDER — SEMAGLUTIDE 3 MG PO TABS: 3.0000 mg | ORAL_TABLET | Freq: Every day | ORAL | 0 refills | Status: AC

## 2018-07-27 NOTE — Telephone Encounter (Signed)
Copied from CRM [id number]. Topic: Quick Communication - See Telephone Encounter >> Jul 27, 2018  2:07 PM Arlyss Gandy, NT wrote: CRM for notification. See Telephone encounter for: 07/27/18. Harriett Sine with [facility] calling to see if 2 separate rxs can be sent in for the Semaglutide (RYBELSUS) 3 MG TABS. One as a 3 Mg and one as a 7 MG.

## 2018-07-27 NOTE — Telephone Encounter (Signed)
See note

## 2018-07-27 NOTE — Telephone Encounter (Signed)
Ok to send in wellbutrin XR 300mg  tablet.  He wont need to double up with this.  Katina Degree. Jimmey Ralph, MD 07/27/2018 9:18 AM

## 2018-07-27 NOTE — Telephone Encounter (Signed)
Rx sent to pharmacy   

## 2018-08-11 DIAGNOSIS — K529 Noninfective gastroenteritis and colitis, unspecified: Secondary | ICD-10-CM | POA: Diagnosis not present

## 2018-08-13 ENCOUNTER — Ambulatory Visit: Payer: 59 | Admitting: Dietician

## 2018-08-27 ENCOUNTER — Other Ambulatory Visit: Payer: Self-pay | Admitting: Family Medicine

## 2018-09-21 ENCOUNTER — Other Ambulatory Visit: Payer: Self-pay

## 2018-09-21 ENCOUNTER — Encounter: Payer: 59 | Attending: Family Medicine | Admitting: Dietician

## 2018-09-21 NOTE — Progress Notes (Signed)
  Medical Nutrition Therapy:  Appt start time: 1510 end time:  1615.   Assessment:  Primary concerns today: Patient is here today alone to discuss weight loss.  He is a Engineer, civil (consulting) at [employer].  He lives with his wife (who is also working to follow a healthy plan), and his 66 month old son.   07/12/2018 labs noted:  Cholesterol 147, HDL 34, LDL 99, Triglycerides 253, A1C 5.0%.  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   Maintained most of that weight until January 2019 increasing to the 270's. Increased stress with moving, buying a home, wife newly pregnant and started gaining weight with increased eating out and readopting the bad habits. Started Celexa May 2019 for anxiety and gained about 26 lbs.   Changed to Ozempic and recently Rybelsus for appetite control.  He has lost 15 lbs in the past 2 months. Denies stress eating.  Does not say no to food when he is full or when food is present in the office.  Eats out of boredom. Picks up groceries from on line shopping at [employer], occasional [employer] or [employer]. They plan meals as they are making the grocery list. Eats very fast.    Preferred Learning Style:   No preference indicated   Learning Readiness:   Ready  Change in progress   MEDICATIONS: Rybelsus    DIETARY INTAKE: Only eats ground meat or boneless, skinless chicken breast.  Uses steamable frozen vegetables but states that he is picky about vegetables. (green beans or broccoli generally).  Salad more recently. Dislikes greasy food.  Lactose intolerant.  24-hr recall:  B ( AM): skips most mornings (90%)  Snk ( AM): occasional banana or other fruit L ( PM): leftovers, yogurt Snk ( PM): donut or other sweet that is brought into the office occasionally D ( PM): dirty rice (boxed mix) with hamburger, vegetables most often Snk ( PM): rare cereal and whole  milk Beverages: coffee with splenda or stevia, sweetened creamer or whole milk,   Usual physical activity: was going to the gym 3 days per week  Estimated energy needs: for weight loss 2200 calories 95-105 g protein  Progress Towards Goal(s):  In progress.   Nutritional Diagnosis:  NB-1.1 Food and nutrition-related knowledge deficit As related to weight loss.  As evidenced by diet hx and patient report.    Intervention:  Nutrition counseling/eductaion related to healthy eating and weight management.  Discussed inacuracy of BMI for ideal weight for his body frame.  Discussed food choices, mindful eating, eating out tips, and benefits of breakfast and exercise.  Mindfulness  Choices  Eat slowly and stop when you are satisfied- listen to your body  Avoid eating in front of the TV, computer, phone, etc.  "Instead of eating for boredom, I will, _______."  Find ways to increase your vegetable intake. Find ways to be active.  Walking, lawncare, gym, etc. Consider adding breakfast  Teaching Method Utilized:  Visual Auditory  Handouts given during visit include:  My plate  Eating out tips  Breakfast ideas  Meal plan card  Mediterranean diet  Barriers to learning/adherence to lifestyle change: time, motivation  Demonstrated degree of understanding via:  Teach Back   Monitoring/Evaluation:  Dietary intake, exercise, and body weight prn.

## 2018-09-21 NOTE — Patient Instructions (Addendum)
Mindfulness  Choices  Eat slowly and stop when you are satisfied- listen to your body  Avoid eating in front of the TV, computer, phone, etc.  "Instead of eating for boredom, I will, _______."  Find ways to increase your vegetable intake. Find ways to be active.  Walking, lawncare, gym, etc. Consider adding breakfast

## 2018-09-28 ENCOUNTER — Other Ambulatory Visit: Payer: Self-pay | Admitting: Family Medicine

## 2018-10-22 ENCOUNTER — Other Ambulatory Visit: Payer: Self-pay | Admitting: Family Medicine

## 2018-10-23 MED FILL — RYBELSUS 7 MG TABS: 7 | 30 days supply | Qty: 30 | Fill #0

## 2018-10-23 MED FILL — buPROPion HCL ER (XL) 300 M: 300 | 30 days supply | Qty: 30 | Fill #0

## 2018-11-20 MED FILL — OMEPRAZOLE 20 MG CPDR: 20 | 90 days supply | Qty: 90 | Fill #0

## 2018-11-20 MED FILL — buPROPion HCL ER (XL) 300 M: 300 | 30 days supply | Qty: 30 | Fill #1

## 2018-12-14 ENCOUNTER — Ambulatory Visit: Payer: 59 | Admitting: Family Medicine

## 2018-12-20 ENCOUNTER — Other Ambulatory Visit: Payer: Self-pay | Admitting: Family Medicine

## 2018-12-20 MED FILL — RYBELSUS 7 MG TABS: 7 | 30 days supply | Qty: 30 | Fill #0

## 2018-12-27 MED FILL — metFORMIN HCL ER 750 MG TB2: 750 | 30 days supply | Qty: 30 | Fill #0

## 2018-12-27 MED FILL — buPROPion HCL ER (XL) 300 M: 300 | 30 days supply | Qty: 30 | Fill #2

## 2019-01-14 ENCOUNTER — Ambulatory Visit: Payer: 59 | Admitting: Family Medicine

## 2019-01-15 ENCOUNTER — Other Ambulatory Visit: Payer: Self-pay

## 2019-01-15 ENCOUNTER — Ambulatory Visit (INDEPENDENT_AMBULATORY_CARE_PROVIDER_SITE_OTHER): Payer: 59 | Admitting: Family Medicine

## 2019-01-15 ENCOUNTER — Encounter: Payer: 59 | Attending: Family Medicine | Admitting: Dietician

## 2019-01-15 ENCOUNTER — Encounter: Payer: Self-pay | Admitting: Family Medicine

## 2019-01-15 DIAGNOSIS — F321 Major depressive disorder, single episode, moderate: Secondary | ICD-10-CM

## 2019-01-15 DIAGNOSIS — F419 Anxiety disorder, unspecified: Secondary | ICD-10-CM | POA: Diagnosis not present

## 2019-01-15 DIAGNOSIS — Z6841 Body Mass Index (BMI) 40.0 and over, adult: Secondary | ICD-10-CM

## 2019-01-15 DIAGNOSIS — K219 Gastro-esophageal reflux disease without esophagitis: Secondary | ICD-10-CM

## 2019-01-15 MED ORDER — METFORMIN HCL ER 750 MG PO TB24: 1500.0000 mg | ORAL_TABLET | Freq: Every day | ORAL | 1 refills | Status: DC

## 2019-01-15 NOTE — Assessment & Plan Note (Signed)
Stable.  Continue Wellbutrin 300 mg daily. 

## 2019-01-15 NOTE — Patient Instructions (Addendum)
It was very nice to see you today!  Keep up the good work!  Please increase your metformin to let me know if you have any side effects.  You can try using a compression sleeve to your knee to see if this helps.  Taking naproxen is okay also.  No other changes today.  Please come back to see me in 3 to 6 months, or sooner as needed.  Take care, Dr Jerline Pain

## 2019-01-15 NOTE — Assessment & Plan Note (Signed)
Stable

## 2019-01-15 NOTE — Progress Notes (Signed)
   Chief Complaint:  Kent Griffith is a 23 y.o. male who presents today with a chief complaint of obesity follow up.   Assessment/Plan:  Knee Pain No red flags.  Recommended compression and ice as needed.  Can use over-the-counter NSAIDs as needed as well.  Morbid obesity ([place]) Stable.  Continue rybelsus 7mg  daily.  Increase metformin to 1500 mg daily.  Continue exercise and diet modification.  Anxiety Stable.  Depression, major, single episode, moderate (HCC) Stable.  Continue Wellbutrin 300 mg daily.  GERD (gastroesophageal reflux disease) Stable.  Continue Prilosec 20 mg daily.     Subjective:  HPI:  He has had some right knee pain for the past several weeks.  Worse with standing and walking.  Has a history of meniscal tear.  Pain is in the area of his previous surgery.  His stable, chronic medical conditions are outlined below:  #Morbid Obesity - On rybelsus 7mg  daily and metformin 750mg  daily - Working with nutritionist - Walks daily  #Anxiety/depression - On wellbutrin 300mg  daily  and tolerating well - ROS: No SI or HI  #Hypothyroidism - Follows with an integrative doctor. Has been on armour thyroidin the past.  Not currently on any medications.  #GERD -Symptoms well controlled on omeprazole 20mg    ROS: Per HPI  PMH: He reports that he has never smoked. He has never used smokeless tobacco. He reports current alcohol use. No history on file for drug.      Objective:  Physical Exam: BP 124/74 (BP Location: Left Arm, Patient Position: Sitting, Cuff Size: Large)   Pulse 76   Temp 98.7 F (37.1 C) (Oral)   Ht 5\' 11"  (1.803 m)   Wt (!) 310 lb (140.6 kg)   SpO2 98%   BMI 43.24 kg/m   Wt Readings from Last 3 Encounters:  01/15/19 (!) 310 lb (140.6 kg)  09/21/18 (!) 301 lb (136.5 kg)  07/12/18 (!) 316 lb 4 oz (143.5 kg)  Right Gen: NAD, resting comfortably CV: Regular rate and rhythm with no murmurs appreciated Pulm: Normal work of breathing, clear to  auscultation bilaterally with no crackles, wheezes, or rhonchi MSK: Right knee with no deformities.  Stable to varus and valgus stress.  Anterior and posterior drawer signs negative. Skin: Warm, dry Neuro: Grossly normal, moves all extremities Psych: Normal affect and thought content      Kent Griffith M. Jerline Pain, MD 01/15/2019 2:59 PM

## 2019-01-15 NOTE — Assessment & Plan Note (Signed)
Stable.  Continue Prilosec 20 mg daily. 

## 2019-01-15 NOTE — Assessment & Plan Note (Signed)
Stable.  Continue rybelsus 7mg  daily.  Increase metformin to 1500 mg daily.  Continue exercise and diet modification.

## 2019-01-15 NOTE — Patient Instructions (Signed)
Continue Mindfulness             Choices             Eat slowly and stop when you are satisfied- listen to your body             Avoid eating in front of the TV, computer, phone, etc.             "Instead of eating for boredom, I will, _______."  Find ways to increase your vegetable intake. Find ways to be active.  Walking, Manufacturing systems engineer, gym, etc.- Great job on getting this started. Consider adding breakfast  Consider searching for a new healthy recipe that you can add to your list of things that you enjoy preparing. Do some simple meal planning before your grocery store trip.  This can help with the daily issue of what to have for dinner.  See the salad idea sheet.  This can be a good summer option and your son can have leftovers or components of the salad such as beans, avocado, roasted sweet potato.  Easy meal ideas: 1.  Pasta salad (chick pea or other pasta, roasted red pepper, tomato, olives rinced beans, italian dressing) over a bed loose greens, fresh fruit. 2.  Bean burrito with lettuce, tomato, avocado, fresh fruit. 3.  Grilled chicken, hummus, raw and or roasted vegetables, pita bread, fresh fruit

## 2019-01-15 NOTE — Progress Notes (Signed)
Medical Nutrition Therapy:  Appt start time: 0900 end time:  0930.  Assessment:  Primary concerns today: Patient is here today alone for visit 2 of 3 employee visits.  He would like to lose weight.  States that he has gained weight since his first visit 09/21/18 (pre covid), has started to make changes and has lost a couple of pounds again.  He states that he is aware of the number of calories that were discussed at his last visit but that he will gain weight if he eats this much.  Complains that his metabolism is slow.   States that he has been eating out more for lunch and that this is being changed to healthier options less frequently.  Eats very fast. - Eats out of boredom but has decreased this. Stuggling with exercise but started walking. Has been decreasing portion size.  07/12/2018 labs noted:  Cholesterol 147, HDL 34, LDL 99, Triglycerides 253, A1C 5.0%.  Weight hx: 309 lbs 01/15/19 301 lbs 09/20/2018 07/12/2018 316 lbs He lost to 252 lbs   He lives with his wife and 10 mo son.  They do online shopping and Ante does the cooking and clean up.  MEDICATIONS: see list to include Metformin XR and Rybelsus (for appetite control).  DIETARY INTAKE: Only eats ground meat or boneless, skinless chicken breast.  Uses steamable frozen vegetables but states that he is picky about vegetables. (green beans or broccoli generally).  Salad more recently. Dislikes greasy food.  Lactose intolerant  24-hr recall:  B (AM): Atkin's shake (skips breakfast 90%)  Snk (AM) :occasional fruit  L (PM): leftovers or take out  Snk (PM): occasional D (PM): hamburger helper OR chicken breast, pasta, vegetables  Snk (PM): rare cereal and whole milk Beverages: water, coffee with splenda or stevia, sweetened creamer or whole milk  Recent physical activity: started walking again.  Was going to the gym prior to covid  Estimated energy needs: 2200 calories 95-105 g protein  Progress Towards Goal(s):  In  progress.   Nutritional Diagnosis:  NB-1.1 Food and nutrition-related knowledge deficit As related to weight loss.  As evidenced by diet hx and patient report.    Intervention:  Nutrition counseling/education related to healthy eating and weight management continued.  Discussed continued need for mindful eating, listening to his body, continued exercise, simple meal planning.  Continue Mindfulness             Choices             Eat slowly and stop when you are satisfied- listen to your body             Avoid eating in front of the TV, computer, phone, etc.             "Instead of eating for boredom, I will, _______."  Find ways to increase your vegetable intake. Find ways to be active.  Walking, Manufacturing systems engineer, gym, etc.- Great job on getting this started. Consider adding breakfast  Consider searching for a new healthy recipe that you can add to your list of things that you enjoy preparing. Do some simple meal planning before your grocery store trip.  This can help with the daily issue of what to have for dinner.  See the salad idea sheet.  This can be a good summer option and your son can have leftovers or components of the salad such as beans, avocado, roasted sweet potato.  Easy meal ideas: 1.  Pasta salad (chick pea or other  pasta, roasted red pepper, tomato, olives rinced beans, italian dressing) over a bed loose greens, fresh fruit. 2.  Bean burrito with lettuce, tomato, avocado, fresh fruit. 3.  Grilled chicken, hummus, raw and or roasted vegetables, pita bread, fresh fruit  Handouts given during visit include:  Salad Mixing it up  recipe  Monitoring/Evaluation:  Dietary intake, exercise, and body weight in 1 month(s).

## 2019-01-16 ENCOUNTER — Encounter: Payer: Self-pay | Admitting: Dietician

## 2019-01-16 ENCOUNTER — Encounter: Payer: Self-pay | Admitting: Family Medicine

## 2019-01-17 ENCOUNTER — Other Ambulatory Visit: Payer: Self-pay

## 2019-01-17 MED ORDER — METFORMIN HCL ER 500 MG PO TB24: 1500.0000 mg | ORAL_TABLET | Freq: Every day | ORAL | 3 refills | Status: DC

## 2019-01-18 ENCOUNTER — Other Ambulatory Visit: Payer: Self-pay | Admitting: Family Medicine

## 2019-02-14 ENCOUNTER — Other Ambulatory Visit: Payer: Self-pay | Admitting: Family Medicine

## 2019-02-22 ENCOUNTER — Encounter: Payer: 59 | Attending: Family Medicine | Admitting: Dietician

## 2019-02-24 ENCOUNTER — Encounter: Payer: Self-pay | Admitting: Dietician

## 2019-02-24 NOTE — Progress Notes (Signed)
Medical Nutrition Therapy:  Appt start time: 1300 end time:  3710.   Assessment:  Primary concerns today: Kent Griffith is here today for visit 3 of 3 employee visits.   Last visit 01/15/19. He states that he has been more mindful of what he is eating as well as portion sizes. States that he is not exercising enough. Is not meal planning routinely. Has started a 2 week free trial of NOOM 3 days ago. He has been weighing himself daily before work. He states that his wife thinks he is going overboard. Weight loss of 19 lbs in the past 5 1/2 weeks.  07/12/2018 labs noted: Cholesterol 147, HDL 34, LDL 99, Triglycerides 253, A1C 5.0%.  Weight hx: 290 lbs 02/22/19 309 lbs 01/15/19 301 lbs 09/20/2018 07/12/2018 316 lbs He lost to 252 lbs   He lives with his wife and 73 yo son.  They do online shopping and Kent Griffith does the cooking and clean up.  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  Preferred Learning Style:   No preference indicated   Learning Readiness:   Ready  Change in progress   MEDICATIONS: Metformin (XR) and Rybelsus for appetite control   DIETARY INTAKE: Only eats ground meat or boneless, skinless chicken breast. Uses steamable frozen vegetables but states that he is picky about vegetables. (green beans or broccoli generally). Salad more recently. Dislikes greasy food. Lactose intolerant  24-hr recall:  B ( AM): fruit and greek yogurt (tripple zero) or homemade smoothie  Snk ( AM): occasional fruit  L ( PM): leftovers from dinner or salad, cottage cheese and fruit Snk ( PM): occasional D ( PM): grilled chicken, grilled onions and peppers, spanish rice, black beans, cottage cheese and fruit Snk ( PM):  Beverages: water, coffee with splenda or stevia, sweetened creamer or whole milk  Usual physical activity: walking inconsistently.  Does his own yard work  Estimated energy needs: 2200 calories 95-105 g protein  Progress Towards Goal(s):  In  progress.   Nutritional Diagnosis:  NB-1.1 Food and nutrition-related knowledge deficit As related to weight loss.  As evidenced by diet hx and patient report.    Intervention:  Nutrition counseling/education related to healthy eating and weight management continued.  Reviewed mindfulness, caloric density, benefits of meal planning and exercise,  Discussed vegetable options and ways to prepare. E-mailed Dinner Time meal planning site last week.  Continue Mindfulness Choices Eat slowly and stop when you are satisfied- listen to your body Avoid eating in front of the TV, computer, phone, etc. "Instead of eating for boredom, I will, _______."  Find ways to increase your vegetable intake. Find ways to be active. Walking, lawncare, gym, etc  Continue Breakfast  Consider searching for a new healthy recipe that you can add to your list of things that you enjoy preparing. Do some simple meal planning before your grocery store trip.  This can help with the daily issue of what to have for dinner.  Teaching Method Utilized:  Visual Auditory Hands on  Handouts given during visit include:  Meal plan card  Barriers to learning/adherence to lifestyle change: time  Demonstrated degree of understanding via:  Teach Back   Monitoring/Evaluation:  Dietary intake, exercise, and body weight prn.

## 2019-02-24 NOTE — Patient Instructions (Signed)
Continue Mindfulness Choices Eat slowly and stop when you are satisfied- listen to your body Avoid eating in front of the TV, computer, phone, etc. "Instead of eating for boredom, I will, _______."  Find ways to increase your vegetable intake. Find ways to be active. Walking, lawncare, gym, etc  Continue Breakfast  Consider searching for a new healthy recipe that you can add to your list of things that you enjoy preparing. Do some simple meal planning before your grocery store trip.  This can help with the daily issue of what to have for dinner.

## 2019-03-04 ENCOUNTER — Other Ambulatory Visit: Payer: Self-pay | Admitting: Family Medicine

## 2019-03-13 DIAGNOSIS — Z135 Encounter for screening for eye and ear disorders: Secondary | ICD-10-CM | POA: Diagnosis not present

## 2019-03-13 DIAGNOSIS — H52203 Unspecified astigmatism, bilateral: Secondary | ICD-10-CM | POA: Diagnosis not present

## 2019-03-13 DIAGNOSIS — H5203 Hypermetropia, bilateral: Secondary | ICD-10-CM | POA: Diagnosis not present

## 2019-03-19 ENCOUNTER — Other Ambulatory Visit: Payer: Self-pay | Admitting: Family Medicine

## 2019-04-04 ENCOUNTER — Other Ambulatory Visit: Payer: Self-pay | Admitting: Family Medicine

## 2019-04-14 ENCOUNTER — Encounter: Payer: Self-pay | Admitting: Family Medicine

## 2019-04-23 ENCOUNTER — Encounter: Payer: Self-pay | Admitting: Family Medicine

## 2019-04-23 ENCOUNTER — Telehealth: Payer: 59 | Admitting: Physician Assistant

## 2019-04-23 ENCOUNTER — Telehealth: Payer: Self-pay | Admitting: Family Medicine

## 2019-04-23 ENCOUNTER — Other Ambulatory Visit: Payer: Self-pay | Admitting: Family Medicine

## 2019-04-23 DIAGNOSIS — J019 Acute sinusitis, unspecified: Secondary | ICD-10-CM | POA: Diagnosis not present

## 2019-04-23 MED ORDER — DOXYCYCLINE HYCLATE 100 MG PO CAPS: 100.0000 mg | ORAL_CAPSULE | Freq: Two times a day (BID) | ORAL | 0 refills | Status: DC

## 2019-04-23 NOTE — Progress Notes (Signed)

## 2019-04-23 NOTE — Telephone Encounter (Signed)
Called pt to schedule a virtual appt. Spoke with pt and he said he scheduled a virtual visit through [facility] with another provider and has been prescribed some antibiotics. He will call us if they dont work or he needs something else.

## 2019-05-07 ENCOUNTER — Other Ambulatory Visit: Payer: Self-pay

## 2019-05-07 ENCOUNTER — Encounter (INDEPENDENT_AMBULATORY_CARE_PROVIDER_SITE_OTHER): Payer: Self-pay | Admitting: Family Medicine

## 2019-05-07 ENCOUNTER — Ambulatory Visit (INDEPENDENT_AMBULATORY_CARE_PROVIDER_SITE_OTHER): Payer: 59 | Admitting: Family Medicine

## 2019-05-07 VITALS — BP 125/72 | HR 68 | Temp 98.2°F | Ht 70.0 in | Wt 270.0 lb

## 2019-05-07 DIAGNOSIS — Z1331 Encounter for screening for depression: Secondary | ICD-10-CM

## 2019-05-07 DIAGNOSIS — R5383 Other fatigue: Secondary | ICD-10-CM

## 2019-05-07 DIAGNOSIS — Z0289 Encounter for other administrative examinations: Secondary | ICD-10-CM

## 2019-05-07 DIAGNOSIS — Z9189 Other specified personal risk factors, not elsewhere classified: Secondary | ICD-10-CM | POA: Diagnosis not present

## 2019-05-07 DIAGNOSIS — R0602 Shortness of breath: Secondary | ICD-10-CM | POA: Diagnosis not present

## 2019-05-07 DIAGNOSIS — E7849 Other hyperlipidemia: Secondary | ICD-10-CM | POA: Diagnosis not present

## 2019-05-07 DIAGNOSIS — Z6838 Body mass index (BMI) 38.0-38.9, adult: Secondary | ICD-10-CM | POA: Diagnosis not present

## 2019-05-07 DIAGNOSIS — E66812 Obesity, class 2: Secondary | ICD-10-CM

## 2019-05-07 DIAGNOSIS — R7989 Other specified abnormal findings of blood chemistry: Secondary | ICD-10-CM | POA: Diagnosis not present

## 2019-05-07 NOTE — Progress Notes (Signed)
Office: [phone]  /  Fax: [phone]   Dear Dr. Jimmey Griffith,   Thank you for referring Kent Griffith to our clinic. The following note includes my evaluation and treatment recommendations.  HPI:   Chief Complaint: OBESITY    Kent Griffith has been referred by Kent Griffith. Kent Ralph, MD for consultation regarding his obesity and obesity related comorbidities.    Kent Griffith (MR# [id number]) is a 23 y.o. male who presents on 05/07/2019 for obesity evaluation and treatment. Current BMI is Body mass index is 38.74 kg/m. Kent Griffith has been struggling with his weight for many years and has been unsuccessful in either losing weight, maintaining weight loss, or reaching his healthy weight goal.     Kent Griffith attended our information session and states he is currently in the action stage of change and ready to dedicate time achieving and maintaining a healthier weight. Kent Griffith is interested in becoming our patient and working on intensive lifestyle modifications including (but not limited to) diet, exercise and weight loss.    Kent Griffith states his family eats meals together he thinks his family will eat healthier with  him his desired weight loss is 70-90 lbs he has been heavy most of  his life he started gaining weight in his teenage years and on his heaviest weight ever was 317 lbs he is a picky eater and doesn't like to eat healthier foods  he has significant food cravings issues  he snacks frequently in the evenings he skips meals frequently he is frequently drinking liquids with calories he frequently makes poor food choices he has problems with excessive hunger  he frequently eats larger portions than normal  he struggles with emotional eating    Fatigue Kent Griffith feels his energy is lower than it should be. This has worsened with weight gain and has not worsened recently. Kent Griffith admits to daytime somnolence and  admits to waking up still tired. Patient has a history of obstructive sleep apnea. Patent has a history of  symptoms of daytime fatigue. Patient generally gets 8 hours of sleep per night, and states they generally have nightime awakenings. Snoring is present. Apneic episodes are present. Epworth Sleepiness Score is 7.  Dyspnea on exertion Kent Griffith notes increasing shortness of breath with exercising and seems to be worsening over time with weight gain. He notes getting out of breath sooner with activity than he used to. This has not gotten worse recently. Kent Griffith denies orthopnea.  Hyperlipidemia Kent Griffith has hyperlipidemia and has had high cholesterol in the past. He is not on statin and is attempting to improve his cholesterol levels with intensive lifestyle modification including a low saturated fat diet, exercise and weight loss. He denies any chest pain, claudication or myalgias.  At risk for cardiovascular disease Kent Griffith is at a higher than average risk for cardiovascular disease due to obesity and hyperlipidemia. He currently denies any chest pain.  Elevated LFTs Kent Griffith has a mildly elevated ALT. He denies abdominal pain or jaundice and has never been told of any liver problems in the past. He denies excessive alcohol intake.  Elevated TSH Kent Griffith is not on levothyroxine and notes fatigue. He has had 2 mildly elevated TSH readings in the past.  Depression Screen Kent Griffith's Food and Mood (modified PHQ-9) score was  Depression screen PHQ 2/9 05/07/2019  Decreased Interest 1  Down, Depressed, Hopeless 2  PHQ - 2 Score 3  Altered sleeping 1  Tired, decreased energy 3  Change in appetite 2  Feeling bad or failure about yourself  3  Trouble concentrating 2  Moving slowly or fidgety/restless 0  Suicidal thoughts 0  PHQ-9 Score 14  Difficult doing work/chores Not difficult at all    ASSESSMENT AND PLAN:  Other fatigue - Plan: EKG 12-Lead, Comprehensive metabolic panel, CBC with Differential/Platelet, Hemoglobin A1c, Insulin, random, VITAMIN D 25 Hydroxy (Vit-D Deficiency, Fractures), Vitamin B12, Folate, T3,  T4, free, TSH  SOB (shortness of breath) on exertion - Plan: Lipid Panel With LDL/HDL Ratio  Other hyperlipidemia  Elevated LFTs  Elevated TSH  Depression screening  At risk for heart disease  Class 2 severe obesity with serious comorbidity and body mass index (BMI) of 38.0 to 38.9 in adult, unspecified obesity type (HCC)  PLAN:  Fatigue Kent Griffith was informed that his fatigue may be related to obesity, depression or many other causes. Labs will be ordered, and in the meanwhile [name] has agreed to work on diet, exercise and weight loss to help with fatigue. Proper sleep hygiene was discussed including the need for 7-8 hours of quality sleep each night. A sleep study was not ordered based on symptoms and Epworth score.  Dyspnea on exertion Kent Griffith's shortness of breath appears to be obesity related and exercise induced. He has agreed to work on weight loss and gradually increase exercise to treat his exercise induced shortness of breath. If Kent Griffith follows our instructions and loses weight without improvement of his shortness of breath, we will plan to refer to pulmonology. We will monitor this condition regularly. Kent Griffith agrees to this plan.  Hyperlipidemia Kent Griffith was informed of the American Heart Association Guidelines emphasizing intensive lifestyle modifications as the first line treatment for hyperlipidemia. We discussed many lifestyle modifications today in depth, and Kent Griffith will continue to work on decreasing saturated fats such as fatty red meat, butter and many fried foods. Kent Griffith will start his diet, and will also increase vegetables and lean protein in his diet and continue to work on exercise and weight loss efforts. We will check labs today. Kent Griffith agrees to follow up with our clinic in 2 weeks.  Cardiovascular risk counseling Kent Griffith was given extended (15 minutes) coronary artery disease prevention counseling today. He is 23 y.o. male and has risk factors for heart disease including obesity and  hyperlipidemia. We discussed intensive lifestyle modifications today with an emphasis on specific weight loss instructions and strategies. Pt was also informed of the importance of increasing exercise and decreasing saturated fats to help prevent heart disease.  Elevated LFTs We discussed the likely diagnosis of non alcoholic fatty liver disease today and how this condition is obesity related. Rowyn was educated on his risk of developing NASH or even liver failure and th only proven treatment for NAFLD was weight loss. Kent Griffith agreed to continue with his weight loss efforts with healthier diet and exercise as an essential part of his treatment plan. We will check labs today. Kent Griffith agrees to follow up with our clinic in 2 weeks.  Elevated TSH We will check labs today. Kent Griffith agrees to follow up with our clinic in 2 weeks.  Depression Screen Kent Griffith had a moderately positive depression screening. Depression is commonly associated with obesity and often results in emotional eating behaviors. We will monitor this closely and work on CBT to help improve the non-hunger eating patterns. Referral to Psychology may be required if no improvement is seen as he continues in our clinic.  Obesity Story is currently in the action stage of change and his goal is to continue with weight loss efforts. Kent Griffith  recommend Kent Griffith begin the structured treatment plan as follows:  He has agreed to follow the Category 3 plan Kent Griffith has been instructed to eventually work up to a goal of 150 minutes of combined cardio and strengthening exercise per week for weight loss and overall health benefits. We discussed the following Behavioral Modification Strategies today: increasing lean protein intake, decreasing simple carbohydrates, and no skipping meals    He was informed of the importance of frequent follow up visits to maximize his success with intensive lifestyle modifications for his multiple health conditions. He was informed we would discuss  his lab results at his next visit unless there is a critical issue that needs to be addressed sooner. Kent Griffith agreed to keep his next visit at the agreed upon time to discuss these results.  ALLERGIES: Allergies  Allergen Reactions   Penicillins Anaphylaxis, Hives, Itching and Rash    MEDICATIONS: Current Outpatient Medications on File Prior to Visit  Medication Sig Dispense Refill   buPROPion (WELLBUTRIN XL) 300 MG 24 hr tablet Take 300 mg by mouth daily.     metFORMIN (GLUCOPHAGE XR) 500 MG 24 hr tablet Take 3 tablets (1,500 mg total) by mouth daily with breakfast. 270 tablet 3   omeprazole (PRILOSEC) 20 MG capsule TAKE ONE CAPSULE BY MOUTH DAILY 90 capsule 0   RYBELSUS 7 MG TABS TAKE 1 TABLET BY MOUTH EVERY DAY 30 tablet 1   No current facility-administered medications on file prior to visit.     PAST MEDICAL HISTORY: Past Medical History:  Diagnosis Date   Anxiety    Depression    GERD (gastroesophageal reflux disease)    Hypothyroidism    IBS (irritable bowel syndrome)    Joint pain    Lactose intolerance    Obesity    Sleep apnea     PAST SURGICAL HISTORY: Past Surgical History:  Procedure Laterality Date   KNEE ARTHROSCOPY, MEDIAL PATELLO FEMORAL LIGAMENT REPAIR Right    TONSILLECTOMY AND ADENOIDECTOMY      SOCIAL HISTORY: Social History   Tobacco Use   Smoking status: Never Smoker   Smokeless tobacco: Never Used  Substance Use Topics   Alcohol use: Yes    Comment: occ.   Drug use: Not on file    FAMILY HISTORY: Family History  Problem Relation Age of Onset   Depression Mother    Suicidality Mother    Anxiety disorder Mother    Bipolar disorder Mother    Drug abuse Mother    Obesity Mother    Alcohol abuse Father    Anxiety disorder Father    Depression Father    Bipolar disorder Father    Schizophrenia Father    Liver disease Father    Drug abuse Father    Colon polyps Paternal Grandmother    Esophageal  cancer Neg Hx    Pancreatic cancer Neg Hx    Stomach cancer Neg Hx     ROS: Review of Systems  Constitutional: Positive for malaise/fatigue. Negative for weight loss.  Eyes:       Negative jaundice + Wear glasses or contacts (astigmatism) + Floaters  Respiratory: Positive for shortness of breath (with exertion).   Cardiovascular: Negative for chest pain, orthopnea and claudication.  Gastrointestinal: Negative for abdominal pain.  Musculoskeletal: Negative for myalgias.       + Muscle or joint pain  Skin:       + Dryness  Psychiatric/Behavioral: The patient is nervous/anxious.        + Stress  PHYSICAL EXAM: Blood pressure 125/72, pulse 68, temperature 98.2 F (36.8 C), temperature source Oral, height  (1.778 m), weight 270 lb (122.5 kg), SpO2 100 %. Body mass index is 38.74 kg/m. Physical Exam Vitals signs reviewed.  Constitutional:      Appearance: Normal appearance. He is obese.  HENT:     Head: Normocephalic and atraumatic.     Nose: Nose normal.  Eyes:     General: No scleral icterus.    Extraocular Movements: Extraocular movements intact.  Neck:     Musculoskeletal: Normal range of motion and neck supple.     Comments: No thyromegaly present Cardiovascular:     Rate and Rhythm: Normal rate and regular rhythm.     Pulses: Normal pulses.     Heart sounds: Normal heart sounds.  Pulmonary:     Effort: Pulmonary effort is normal. No respiratory distress.     Breath sounds: Normal breath sounds.  Abdominal:     Palpations: Abdomen is soft.     Tenderness: There is no abdominal tenderness.     Comments: + Obesity  Musculoskeletal: Normal range of motion.     Right lower leg: No edema.     Left lower leg: No edema.  Skin:    General: Skin is warm and dry.  Neurological:     Mental Status: He is alert and oriented to person, place, and time.     Coordination: Coordination normal.  Psychiatric:        Mood and Affect: Mood normal.        Behavior:  Behavior normal.     RECENT LABS AND TESTS: BMET    Component Value Date/Time   NA 137 07/12/2018 1138   K 4.2 07/12/2018 1138   CL 102 07/12/2018 1138   CO2 28 07/12/2018 1138   GLUCOSE 88 07/12/2018 1138   BUN 9 07/12/2018 1138   CREATININE 0.99 07/12/2018 1138   CREATININE 1.10 11/10/2017 1637   CALCIUM 9.3 07/12/2018 1138   Lab Results  Component Value Date   HGBA1C 5.0 07/12/2018   No results found for: INSULIN CBC    Component Value Date/Time   WBC 6.1 07/12/2018 1138   RBC 5.53 07/12/2018 1138   HGB 14.9 07/12/2018 1138   HCT 43.8 07/12/2018 1138   PLT 187.0 07/12/2018 1138   MCV 79.2 07/12/2018 1138   MCH 27.0 11/10/2017 1637   MCHC 34.1 07/12/2018 1138   RDW 13.8 07/12/2018 1138   Iron/TIBC/Ferritin/ %Sat No results found for: IRON, TIBC, FERRITIN, IRONPCTSAT Lipid Panel     Component Value Date/Time   CHOL 147 07/12/2018 1138   TRIG 253.0 (H) 07/12/2018 1138   HDL 34.70 (L) 07/12/2018 1138   CHOLHDL 4 07/12/2018 1138   VLDL 50.6 (H) 07/12/2018 1138   LDLDIRECT 99.0 07/12/2018 1138   Hepatic Function Panel     Component Value Date/Time   PROT 7.0 07/12/2018 1138   ALBUMIN 4.7 07/12/2018 1138   AST 32 07/12/2018 1138   ALT 54 (H) 07/12/2018 1138   ALKPHOS 78 07/12/2018 1138   BILITOT 0.5 07/12/2018 1138      Component Value Date/Time   TSH 4.81 (H) 07/12/2018 1138   TSH 4.53 (H) 11/10/2017 1637    ECG  shows NSR with a rate of 71 BPM INDIRECT CALORIMETER done today shows a VO2 of 397 and a REE of 2766.  His calculated basal metabolic rate is 2952 thus his basal metabolic rate is better than expected.  OBESITY BEHAVIORAL INTERVENTION VISIT  Today's visit was # 1   Starting weight: 270 lbs Starting date: 05/07/2019 Today's weight : 270 lbs Today's date: 05/07/2019 Total lbs lost to date: 0    ASK: We discussed the diagnosis of obesity with Kent KocherNoah Griffith today and Kent Riedeloah agreed to give us permission to discuss obesity  behavioral modification therapy today.  ASSESS: Kent Riedeloah has the diagnosis of obesity and his BMI today is 38.74 Kent Riedeloah is in the action stage of change   ADVISE: Kent Riedeloah was educated on the multiple health risks of obesity as well as the benefit of weight loss to improve his health. He was advised of the need for long term treatment and the importance of lifestyle modifications to improve his current health and to decrease his risk of future health problems.  AGREE: Multiple dietary modification options and treatment options were discussed and  Kent Riedeloah agreed to follow the recommendations documented in the above note.  ARRANGE: Kent Riedeloah was educated on the importance of frequent visits to treat obesity as outlined per CMS and USPSTF guidelines and agreed to schedule his next follow up appointment today.  Kent Griffith, Burt KnackSharon Martin, am acting as transcriptionist for Quillian Quincearen Ruthell Feigenbaum, MD  Kent Griffith have reviewed the above documentation for accuracy and completeness, and Kent Griffith agree with the above. -Quillian Quincearen Charleene Callegari, MD

## 2019-05-08 LAB — COMPREHENSIVE METABOLIC PANEL
ALT: 38 IU/L (ref 0–44)
AST: 31 IU/L (ref 0–40)
Albumin/Globulin Ratio: 2 (ref 1.2–2.2)
Albumin: 4.7 g/dL (ref 4.1–5.2)
Alkaline Phosphatase: 86 IU/L (ref 39–117)
BUN/Creatinine Ratio: 10 (ref 9–20)
BUN: 9 mg/dL (ref 6–20)
Bilirubin Total: 0.5 mg/dL (ref 0.0–1.2)
CO2: 24 mmol/L (ref 20–29)
Calcium: 9.4 mg/dL (ref 8.7–10.2)
Chloride: 104 mmol/L (ref 96–106)
Creatinine, Ser: 0.93 mg/dL (ref 0.76–1.27)
GFR calc Af Amer: 131 mL/min/{1.73_m2} (ref >59)
GFR calc non Af Amer: 114 mL/min/{1.73_m2} (ref >59)
Globulin, Total: 2.3 g/dL (ref 1.5–4.5)
Glucose: 84 mg/dL (ref 65–99)
Potassium: 4.4 mmol/L (ref 3.5–5.2)
Sodium: 141 mmol/L (ref 134–144)
Total Protein: 7 g/dL (ref 6.0–8.5)

## 2019-05-08 LAB — LIPID PANEL WITH LDL/HDL RATIO
Cholesterol, Total: 124 mg/dL (ref 100–199)
HDL: 43 mg/dL (ref >39)
LDL Chol Calc (NIH): 58 mg/dL (ref 0–99)
LDL/HDL Ratio: 1.3 ratio (ref 0.0–3.6)
Triglycerides: 130 mg/dL (ref 0–149)
VLDL Cholesterol Cal: 23 mg/dL (ref 5–40)

## 2019-05-08 LAB — CBC WITH DIFFERENTIAL/PLATELET
Basophils Absolute: 0 10*3/uL (ref 0.0–0.2)
Basos: 1 %
EOS (ABSOLUTE): 0.1 10*3/uL (ref 0.0–0.4)
Eos: 2 %
Hematocrit: 46.3 % (ref 37.5–51.0)
Hemoglobin: 14.9 g/dL (ref 13.0–17.7)
Immature Grans (Abs): 0 10*3/uL (ref 0.0–0.1)
Immature Granulocytes: 0 %
Lymphocytes Absolute: 1.5 10*3/uL (ref 0.7–3.1)
Lymphs: 33 %
MCH: 25.7 pg — ABNORMAL LOW (ref 26.6–33.0)
MCHC: 32.2 g/dL (ref 31.5–35.7)
MCV: 80 fL (ref 79–97)
Monocytes Absolute: 0.3 10*3/uL (ref 0.1–0.9)
Monocytes: 7 %
Neutrophils Absolute: 2.6 10*3/uL (ref 1.4–7.0)
Neutrophils: 57 %
Platelets: 230 10*3/uL (ref 150–450)
RBC: 5.8 x10E6/uL (ref 4.14–5.80)
RDW: 14.2 % (ref 11.6–15.4)
WBC: 4.6 10*3/uL (ref 3.4–10.8)

## 2019-05-08 LAB — HEMOGLOBIN A1C
Est. average glucose Bld gHb Est-mCnc: 94 mg/dL
Hgb A1c MFr Bld: 4.9 % (ref 4.8–5.6)

## 2019-05-08 LAB — INSULIN, RANDOM: INSULIN: 12.3 u[IU]/mL (ref 2.6–24.9)

## 2019-05-08 LAB — VITAMIN B12: Vitamin B-12: 317 pg/mL (ref 232–1245)

## 2019-05-08 LAB — T4, FREE: Free T4: 1.3 ng/dL (ref 0.82–1.77)

## 2019-05-08 LAB — T3: T3, Total: 126 ng/dL (ref 71–180)

## 2019-05-08 LAB — TSH: TSH: 4.98 u[IU]/mL — ABNORMAL HIGH (ref 0.450–4.500)

## 2019-05-08 LAB — VITAMIN D 25 HYDROXY (VIT D DEFICIENCY, FRACTURES): Vit D, 25-Hydroxy: 31.7 ng/mL (ref 30.0–100.0)

## 2019-05-08 LAB — FOLATE: Folate: 15.2 ng/mL (ref >3.0)
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
## 2019-05-14 ENCOUNTER — Other Ambulatory Visit: Payer: Self-pay | Admitting: Family Medicine

## 2019-05-14 ENCOUNTER — Ambulatory Visit (INDEPENDENT_AMBULATORY_CARE_PROVIDER_SITE_OTHER): Payer: 59 | Admitting: Psychology

## 2019-05-14 ENCOUNTER — Other Ambulatory Visit: Payer: Self-pay

## 2019-05-14 DIAGNOSIS — F418 Other specified anxiety disorders: Secondary | ICD-10-CM

## 2019-05-15 ENCOUNTER — Other Ambulatory Visit: Payer: Self-pay

## 2019-05-15 ENCOUNTER — Encounter: Payer: Self-pay | Admitting: Family Medicine

## 2019-05-15 MED ORDER — BUPROPION HCL ER (XL) 300 MG PO TB24: 300.0000 mg | ORAL_TABLET | Freq: Every day | ORAL | 3 refills | Status: DC

## 2019-05-19 ENCOUNTER — Encounter: Payer: Self-pay | Admitting: Family Medicine

## 2019-05-21 ENCOUNTER — Encounter (INDEPENDENT_AMBULATORY_CARE_PROVIDER_SITE_OTHER): Payer: Self-pay | Admitting: Family Medicine

## 2019-05-21 ENCOUNTER — Other Ambulatory Visit: Payer: Self-pay

## 2019-05-21 ENCOUNTER — Ambulatory Visit (INDEPENDENT_AMBULATORY_CARE_PROVIDER_SITE_OTHER): Payer: 59 | Admitting: Family Medicine

## 2019-05-21 VITALS — BP 109/69 | HR 66 | Temp 98.2°F | Ht 70.0 in | Wt 266.0 lb

## 2019-05-21 DIAGNOSIS — Z6838 Body mass index (BMI) 38.0-38.9, adult: Secondary | ICD-10-CM

## 2019-05-21 DIAGNOSIS — E8881 Metabolic syndrome: Secondary | ICD-10-CM

## 2019-05-21 DIAGNOSIS — E559 Vitamin D deficiency, unspecified: Secondary | ICD-10-CM

## 2019-05-21 DIAGNOSIS — Z9189 Other specified personal risk factors, not elsewhere classified: Secondary | ICD-10-CM | POA: Diagnosis not present

## 2019-05-21 DIAGNOSIS — R7989 Other specified abnormal findings of blood chemistry: Secondary | ICD-10-CM | POA: Diagnosis not present

## 2019-05-21 MED ORDER — VITAMIN D (ERGOCALCIFEROL) 1.25 MG (50000 UNIT) PO CAPS: 50000.0000 [IU] | ORAL_CAPSULE | ORAL | 0 refills | Status: DC

## 2019-05-21 NOTE — Progress Notes (Signed)
Office: [phone]  /  Fax: [phone]   HPI:   Chief Complaint: OBESITY Kent Griffith is here to discuss his progress with his obesity treatment plan. He is on the Category 3 plan and is following his eating plan approximately 60-70% of the time. He states he is working with a Systems analystpersonal trainer 75-90 minutes 3 times per week. Kent Griffith did well with weight loss, but struggled with his plan, especially dinner. He states he didn't always eat all of his protein. His weight is 266 lb (120.7 kg) today and has had a weight loss of 4 pounds over a period of 2 weeks since his last visit. He has lost 4 lbs since starting treatment with us.  Vitamin D deficiency Kent Griffith has a new diagnosis of Vitamin D deficiency. He is not currently taking Vitamin D and denies nausea, vomiting or muscle weakness but does report fatigue.  Insulin Resistance Kent Griffith has a new diagnosis of insulin resistance based on his elevated fasting insulin level >5. His A1c and glucose were within normal limits, however, his fasting insulin was mildly elevated. He denies hypoglycemia. Although Kiaan's blood glucose readings are still under good control, insulin resistance puts him at greater risk of metabolic syndrome and diabetes.  At risk for diabetes Kent Griffith is at higher than average risk for developing diabetes due to his obesity. He currently denies polyuria or polydipsia.  ASSESSMENT AND PLAN:  Vitamin D deficiency - Plan: Vitamin D, Ergocalciferol, (DRISDOL) 1.25 MG (50000 UT) CAPS capsule  Insulin resistance  Elevated TSH  At risk for diabetes mellitus  Class 2 severe obesity with serious comorbidity and body mass index (BMI) of 38.0 to 38.9 in adult, unspecified obesity type (HCC)  PLAN:  Vitamin D Deficiency Kent Griffith was informed that low Vitamin D levels contributes to fatigue and are associated with obesity, breast, and colon cancer. He agrees to start prescription Vit D @ 50,000 IU every week #4 with 0 refills and will follow-up  for routine testing of Vitamin D, at least 2-3 times per year. He was informed of the risk of over-replacement of Vitamin D and agrees to not increase his dose unless he discusses this with us first. Kent Griffith agrees to follow-up with our clinic in 2 weeks.  Insulin Resistance Kent Griffith will continue to work on weight loss, exercise, and decreasing simple carbohydrates in his diet to help decrease the risk of diabetes. We dicussed metformin including benefits and risks. He was informed that eating too many simple carbohydrates or too many calories at one sitting increases the likelihood of GI side effects. Kent Griffith will continue diet and will have his labs rechecked in 3 months. We will defer on metformin for the time being. He will follow-up as directed to monitor his progress.  Diabetes risk counseling Kent Griffith was given extended (15 minutes) diabetes prevention counseling today. He is 23 y.o. male and has risk factors for diabetes including obesity. We discussed intensive lifestyle modifications today with an emphasis on weight loss as well as increasing exercise and decreasing simple carbohydrates in his diet.  Obesity Kent Griffith is currently in the action stage of change. As such, his goal is to continue with weight loss efforts. He has agreed to follow the Category 3 plan and journal 400-600 calories and 40+ grams of protein at supper. Kent Griffith has been instructed to work up to a goal of 150 minutes of combined cardio and strengthening exercise per week for weight loss and overall health benefits. We discussed the following Behavioral Modification Strategies today:  increasing lean protein intake and decreasing simple carbohydrates.  Barnie has agreed to follow-up with our clinic in 2 weeks. He was informed of the importance of frequent follow-up visits to maximize his success with intensive lifestyle modifications for his multiple health conditions.  ALLERGIES: Allergies  Allergen Reactions  . Penicillins Anaphylaxis,  Hives, Itching and Rash    MEDICATIONS: Current Outpatient Medications on File Prior to Visit  Medication Sig Dispense Refill  . buPROPion (WELLBUTRIN XL) 300 MG 24 hr tablet Take 1 tablet (300 mg total) by mouth daily. 90 tablet 3  . metFORMIN (GLUCOPHAGE XR) 500 MG 24 hr tablet Take 3 tablets (1,500 mg total) by mouth daily with breakfast. 270 tablet 3  . Multiple Vitamins-Minerals (MULTIVITAMIN WITH MINERALS) tablet Take 1 tablet by mouth daily.    Marland Kitchen omeprazole (PRILOSEC) 20 MG capsule TAKE ONE CAPSULE BY MOUTH DAILY 90 capsule 0  . RYBELSUS 7 MG TABS TAKE 1 TABLET BY MOUTH EVERY DAY 30 tablet 1   No current facility-administered medications on file prior to visit.     PAST MEDICAL HISTORY: Past Medical History:  Diagnosis Date  . Anxiety   . Depression   . GERD (gastroesophageal reflux disease)   . Hypothyroidism   . IBS (irritable bowel syndrome)   . Joint pain   . Lactose intolerance   . Obesity   . Sleep apnea     PAST SURGICAL HISTORY: Past Surgical History:  Procedure Laterality Date  . KNEE ARTHROSCOPY, MEDIAL PATELLO FEMORAL LIGAMENT REPAIR Right   . TONSILLECTOMY AND ADENOIDECTOMY      SOCIAL HISTORY: Social History   Tobacco Use  . Smoking status: Never Smoker  . Smokeless tobacco: Never Used  Substance Use Topics  . Alcohol use: Yes    Comment: occ.  . Drug use: Not on file    FAMILY HISTORY: Family History  Problem Relation Age of Onset  . Depression Mother   . Suicidality Mother   . Anxiety disorder Mother   . Bipolar disorder Mother   . Drug abuse Mother   . Obesity Mother   . Alcohol abuse Father   . Anxiety disorder Father   . Depression Father   . Bipolar disorder Father   . Schizophrenia Father   . Liver disease Father   . Drug abuse Father   . Colon polyps Paternal Grandmother   . Esophageal cancer Neg Hx   . Pancreatic cancer Neg Hx   . Stomach cancer Neg Hx    ROS: Review of Systems  Constitutional: Positive for  malaise/fatigue.  Gastrointestinal: Negative for nausea and vomiting.  Musculoskeletal:       Negative for muscle weakness.  Endo/Heme/Allergies:       Negative for hypoglycemia.   PHYSICAL EXAM: Blood pressure 109/69, pulse 66, temperature 98.2 F (36.8 C), temperature source Oral, height  (1.778 m), weight 266 lb (120.7 kg), SpO2 99 %. Body mass index is 38.17 kg/m. Physical Exam Vitals signs reviewed.  Constitutional:      Appearance: Normal appearance. He is obese.  Cardiovascular:     Rate and Rhythm: Normal rate.     Pulses: Normal pulses.  Pulmonary:     Effort: Pulmonary effort is normal.     Breath sounds: Normal breath sounds.  Musculoskeletal: Normal range of motion.  Skin:    General: Skin is warm and dry.  Neurological:     Mental Status: He is alert and oriented to person, place, and time.  Psychiatric:  Behavior: Behavior normal.   RECENT LABS AND TESTS: BMET    Component Value Date/Time   NA 141 05/07/2019 1028   K 4.4 05/07/2019 1028   CL 104 05/07/2019 1028   CO2 24 05/07/2019 1028   GLUCOSE 84 05/07/2019 1028   GLUCOSE 88 07/12/2018 1138   BUN 9 05/07/2019 1028   CREATININE 0.93 05/07/2019 1028   CREATININE 1.10 11/10/2017 1637   CALCIUM 9.4 05/07/2019 1028   GFRNONAA 114 05/07/2019 1028   GFRAA 131 05/07/2019 1028   Lab Results  Component Value Date   HGBA1C 4.9 05/07/2019   HGBA1C 5.0 07/12/2018   Lab Results  Component Value Date   INSULIN 12.3 05/07/2019   CBC    Component Value Date/Time   WBC 4.6 05/07/2019 1028   WBC 6.1 07/12/2018 1138   RBC 5.80 05/07/2019 1028   RBC 5.53 07/12/2018 1138   HGB 14.9 05/07/2019 1028   HCT 46.3 05/07/2019 1028   PLT 230 05/07/2019 1028   MCV 80 05/07/2019 1028   MCH 25.7 (L) 05/07/2019 1028   MCH 27.0 11/10/2017 1637   MCHC 32.2 05/07/2019 1028   MCHC 34.1 07/12/2018 1138   RDW 14.2 05/07/2019 1028   LYMPHSABS 1.5 05/07/2019 1028   EOSABS 0.1 05/07/2019 1028   BASOSABS 0.0  05/07/2019 1028   Iron/TIBC/Ferritin/ %Sat No results found for: IRON, TIBC, FERRITIN, IRONPCTSAT Lipid Panel     Component Value Date/Time   CHOL 124 05/07/2019 1028   TRIG 130 05/07/2019 1028   HDL 43 05/07/2019 1028   CHOLHDL 4 07/12/2018 1138   VLDL 50.6 (H) 07/12/2018 1138   LDLCALC 58 05/07/2019 1028   LDLDIRECT 99.0 07/12/2018 1138   Hepatic Function Panel     Component Value Date/Time   PROT 7.0 05/07/2019 1028   ALBUMIN 4.7 05/07/2019 1028   AST 31 05/07/2019 1028   ALT 38 05/07/2019 1028   ALKPHOS 86 05/07/2019 1028   BILITOT 0.5 05/07/2019 1028      Component Value Date/Time   TSH 4.980 (H) 05/07/2019 1028   TSH 4.81 (H) 07/12/2018 1138   TSH 4.53 (H) 11/10/2017 1637   Results for JENTZEN, MINASYAN (MRN [id number]) as of 05/21/2019 16:56  Ref. Range 05/07/2019 10:28  Vitamin D, 25-Hydroxy Latest Ref Range: 30.0 - 100.0 ng/mL 31.7   OBESITY BEHAVIORAL INTERVENTION VISIT  Today's visit was #2  Starting weight: 270 lbs Starting date: 05/07/2019 Today's weight: 266 lbs Today's date: 05/21/2019 Total lbs lost to date: 4    05/21/2019  Height 5\' 10"  (1.778 m)  Weight 266 lb (120.7 kg)  BMI (Calculated) 38.17  BLOOD PRESSURE - SYSTOLIC 109  BLOOD PRESSURE - DIASTOLIC 69   Body Fat % 32.5 %  Total Body Water (lbs) 129.4 lbs   ASK: We discussed the diagnosis of obesity with today and Janthony agreed to give Kent Riedel permission to discuss obesity behavioral modification therapy today.  ASSESS: Tamario has the diagnosis of obesity and his BMI today is 38.3. Harl is in the action stage of change.   ADVISE: Zakary was educated on the multiple health risks of obesity as well as the benefit of weight loss to improve his health. He was advised of the need for long term treatment and the importance of lifestyle modifications to improve his current health and to decrease his risk of future health problems.  AGREE: Multiple dietary modification options and treatment  options were discussed and  Tyqwan agreed to follow the recommendations documented in  the above note.  ARRANGE: Torence was educated on the importance of frequent visits to treat obesity as outlined per CMS and USPSTF guidelines and agreed to schedule his next follow up appointment today.  I, Michaelene Song, am acting as Location manager for Dennard Nip, MD  I have reviewed the above documentation for accuracy and completeness, and I agree with the above. -Dennard Nip, MD

## 2019-05-22 NOTE — Progress Notes (Unsigned)
Office: [phone]  /  Fax: [phone]    Date: June 03, 2019   Appointment Start Time: *** Duration: *** minutes Provider: Glennie Isle, Psy.D. Type of Session: Individual Therapy  Location of Patient: {gbptloc:23249} Location of Provider: {Location of Service:22491} Type of Contact: Telepsychological Visit via [employer]   Session Content: Kent Griffith is a 23 y.o. male presenting via [employer] for a follow-up appointment to address the previously established treatment goal of increasing coping skills. Today's appointment was a telepsychological visit, as it is an option for appointments to reduce exposure to COVID-19. Kent Griffith expressed understanding regarding the rationale for telepsychological services, and provided verbal consent for today's appointment. Prior to proceeding with today's appointment, Kent Griffith's physical location at the time of this appointment was obtained. In the event of technical difficulties, Kent Griffith shared a phone number he could be reached at. Kent Griffith and this provider participated in today's telepsychological service. Also, Kent Griffith denied anyone else being present in the room or on the WebEx appointment ***.  This provider conducted a brief check-in and verbally administered the PHQ-9 and GAD-7. *** Kent Griffith was receptive to today's session as evidenced by openness to sharing, responsiveness to feedback, and ***.  Mental Status Examination:  Appearance: {Appearance:22431} Behavior: {Behavior:22445} Mood: {gbmood:21757} Affect: {Affect:22436} Speech: {Speech:22432} Eye Contact: {Eye Contact:22433} Psychomotor Activity: {Motor Activity:22434} Thought Process: {thought process:22448}  Content/Perceptual Disturbances: {disturbances:22451} Orientation: {Orientation:22437} Cognition/Sensorium: {gbcognition:22449} Insight: {Insight:22446} Judgment: {Insight:22446}  Structured Assessment Results: The Patient Health Questionnaire-9 (PHQ-9) is a self-report measure that assesses  symptoms and severity of depression over the course of the last two weeks. Kent Griffith obtained a score of *** suggesting {GBPHQ9SEVERITY:21752}. Kent Griffith finds the endorsed symptoms to be {gbphq9difficulty:21754}. Kent Griffith interest or pleasure in doing things ***  Feeling down, depressed, or hopeless ***  Trouble falling or staying asleep, or sleeping too much ***  Feeling tired or having Kent Griffith energy ***  Poor appetite or overeating ***  Feeling bad about yourself --- or that you are a failure or have let yourself or your family down ***  Trouble concentrating on things, such as reading the newspaper or watching television ***  Moving or speaking so slowly that other people could have noticed? Or the opposite --- being so fidgety or restless that you have been moving around a lot more than usual ***  Thoughts that you would be better off dead or hurting yourself in some way ***  PHQ-9 Score ***    The Generalized Anxiety Disorder-7 (GAD-7) is a brief self-report measure that assesses symptoms of anxiety over the course of the last two weeks. Kent Griffith obtained a score of *** suggesting {gbgad7severity:21753}. Kent Griffith finds the endorsed symptoms to be {gbphq9difficulty:21754}. 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 Kent Griffith has demonstrated progress in his goal as evidenced by {gbtxprogress:22839}. Kent Griffith also reported {gbtxprogress2:22951}.  Plan: Kent Griffith  continues to appear able and willing to participate as evidenced by engagement in reciprocal conversation, and asking questions for clarification as appropriate. The next appointment  will be scheduled in {gbweeks:21758}, which will be via [employer]. The next session will focus on reviewing learned skills, and working towards the established treatment goal.***

## 2019-05-22 NOTE — Telephone Encounter (Signed)
Please advise 

## 2019-06-03 ENCOUNTER — Ambulatory Visit (INDEPENDENT_AMBULATORY_CARE_PROVIDER_SITE_OTHER): Payer: 59 | Admitting: Psychology

## 2019-06-04 ENCOUNTER — Other Ambulatory Visit: Payer: Self-pay

## 2019-06-04 ENCOUNTER — Ambulatory Visit (INDEPENDENT_AMBULATORY_CARE_PROVIDER_SITE_OTHER): Payer: 59 | Admitting: Family Medicine

## 2019-06-04 VITALS — BP 100/64 | HR 68 | Temp 98.1°F | Ht 70.0 in | Wt 262.0 lb

## 2019-06-04 DIAGNOSIS — Z9189 Other specified personal risk factors, not elsewhere classified: Secondary | ICD-10-CM | POA: Diagnosis not present

## 2019-06-04 DIAGNOSIS — F3289 Other specified depressive episodes: Secondary | ICD-10-CM

## 2019-06-04 DIAGNOSIS — Z6837 Body mass index (BMI) 37.0-37.9, adult: Secondary | ICD-10-CM

## 2019-06-04 DIAGNOSIS — E8881 Metabolic syndrome: Secondary | ICD-10-CM

## 2019-06-04 DIAGNOSIS — E559 Vitamin D deficiency, unspecified: Secondary | ICD-10-CM

## 2019-06-04 DIAGNOSIS — E538 Deficiency of other specified B group vitamins: Secondary | ICD-10-CM

## 2019-06-04 MED ORDER — VITAMIN D (ERGOCALCIFEROL) 1.25 MG (50000 UNIT) PO CAPS: 50000.0000 [IU] | ORAL_CAPSULE | ORAL | 0 refills | Status: DC

## 2019-06-05 NOTE — Progress Notes (Signed)
Office: [phone]  /  Fax: [phone]   HPI:   Chief Complaint: OBESITY Kent Griffith is here to discuss his progress with his obesity treatment plan. He is on the keep a food journal with 400-600 calories and 40+ grams of protein at supper daily and follow the Category 3 plan and is following his eating plan approximately 60 % of the time. He states he is exercising at the gym for 60-90 minutes 1-3 times per week. Kent Griffith is down 4 lbs. He feels like he made poor choices (eating out). He struggles with dinner (eating with his 97 year old son).  His weight is 262 lb (118.8 kg) today and has had a weight loss of 4 pounds over a period of 2 weeks since his last visit. He has lost 8 lbs since starting treatment with [place].  Vitamin D Deficiency Kent Griffith has a diagnosis of vitamin D deficiency. He is not currently taking Vit D and denies nausea, vomiting or muscle weakness.  Insulin Resistance Kent Griffith has a diagnosis of insulin resistance based on his elevated fasting insulin level >5. Although Kent Griffith's blood glucose readings are still under good control, insulin resistance puts him at greater risk of metabolic syndrome and diabetes. He is taking metformin 1,500 mg XR q AM and Rybelsus 7 mg PO daily. He continues to work on diet and exercise to decrease risk of diabetes.  At risk for diabetes Kent Griffith is at higher than average risk for developing diabetes due to his obesity and insulin resistance. He currently denies polyuria or polydipsia.  Vitamin B12 Deficiency Kent Griffith has a diagnosis of B12 insufficiency. He is not a vegetarian and does not have a previous diagnosis of pernicious anemia. He does not have a history of weight loss surgery.   Depression with Emotional Eating Behaviors Kent Griffith is taking Wellbutrin and he is seeing Dr. Mallie Mussel. Uri struggles with emotional eating and using food for comfort to the extent that it is negatively impacting his health. He often snacks when he is not hungry. Kent Griffith sometimes feels  he is out of control and then feels guilty that he made poor food choices. He has been working on behavior modification techniques to help reduce his emotional eating and has been somewhat successful. He shows no sign of suicidal or homicidal ideations.  Depression screen [facility] 2/9 05/07/2019 01/15/2019 09/21/2018 12/12/2017 11/10/2017  Decreased Interest 1 0 0 0 1  Down, Depressed, Hopeless 2 0 0 0 1  PHQ - 2 Score 3 0 0 0 2  Altered sleeping 1 1 - 0 2  Tired, decreased energy 3 1 - 0 3  Change in appetite 2 2 - 1 3  Feeling bad or failure about yourself  3 0 - 0 3  Trouble concentrating 2 0 - 0 0  Moving slowly or fidgety/restless 0 0 - 0 0  Suicidal thoughts 0 0 - 0 0  PHQ-9 Score 14 4 - 1 13  Difficult doing work/chores Not difficult at all Not difficult at all - - Not difficult at all    ASSESSMENT AND PLAN:  Vitamin D deficiency - Plan: Vitamin D, Ergocalciferol, (DRISDOL) 1.25 MG (50000 UT) CAPS capsule  Insulin resistance  Vitamin B12 deficiency  Other depression, with emotional eating   At risk for diabetes mellitus  Class 2 severe obesity with serious comorbidity and body mass index (BMI) of 37.0 to 37.9 in adult, unspecified obesity type (HCC)  PLAN:  Vitamin D Deficiency Kent Griffith was informed that low vitamin D levels  contributes to fatigue and are associated with obesity, breast, and colon cancer. Kent Griffith agrees to start prescription Vit D 50,000 IU every week #4 no refills. He will follow up for routine testing of vitamin D, at least 2-3 times per year. He was informed of the risk of over-replacement of vitamin D and agrees to not increase his dose unless he discusses this with us first. Kent Griffith agrees to follow up with our clinic in 2 weeks.  Insulin Resistance Kent Griffith will continue to work on weight loss, exercise, and decreasing simple carbohydrates in his diet to help decrease the risk of diabetes. We dicussed metformin including benefits and risks. He was informed that eating too  many simple carbohydrates or too many calories at one sitting increases the likelihood of GI side effects. Kent Griffith agrees to continue taking his medications, and he agrees to follow up with our clinic in 2 weeks as directed to monitor his progress.  Diabetes risk counseling Kent Griffith was given extended (15 minutes) diabetes prevention counseling today. He is 23 y.o. male and has risk factors for diabetes including obesity and insulin resistance. We discussed intensive lifestyle modifications today with an emphasis on weight loss as well as increasing exercise and decreasing simple carbohydrates in his diet.  Vitamin B12 Deficiency Kent Griffith will continue healthy diet plan (high in Vitamin B12). B12 supplementation was not prescribed today.   Depression with Emotional Eating Behaviors We discussed behavior modification techniques today to help Kent Griffith deal with his emotional eating and depression. Kent Griffith agrees to continue taking Wellbutrin XL 300 mg PO daily, and he agrees to follow up with our clinic in 2 weeks.  Obesity Kent Griffith is currently in the action stage of change. As such, his goal is to continue with weight loss efforts He has agreed to keep a food journal with 1500 calories and 90 grams of protein daily Kent Griffith has been instructed to work up to a goal of 150 minutes of combined cardio and strengthening exercise per week or as tolerated for weight loss and overall health benefits. We discussed the following Behavioral Modification Strategies today: increasing lean protein intake, decreasing simple carbohydrates, increasing vegetables, increase H20 intake, work on meal planning and easy cooking plans and emotional eating strategies   Kent Griffith has agreed to follow up with our clinic in 2 weeks. He was informed of the importance of frequent follow up visits to maximize his success with intensive lifestyle modifications for his multiple health conditions.  ALLERGIES: Allergies  Allergen Reactions  . Penicillins  Anaphylaxis, Hives, Itching and Rash    MEDICATIONS: Current Outpatient Medications on File Prior to Visit  Medication Sig Dispense Refill  . buPROPion (WELLBUTRIN XL) 300 MG 24 hr tablet Take 1 tablet (300 mg total) by mouth daily. 90 tablet 3  . metFORMIN (GLUCOPHAGE XR) 500 MG 24 hr tablet Take 3 tablets (1,500 mg total) by mouth daily with breakfast. 270 tablet 3  . Multiple Vitamins-Minerals (MULTIVITAMIN WITH MINERALS) tablet Take 1 tablet by mouth daily.    Marland Kitchen. omeprazole (PRILOSEC) 20 MG capsule TAKE ONE CAPSULE BY MOUTH DAILY 90 capsule 0  . RYBELSUS 7 MG TABS TAKE 1 TABLET BY MOUTH EVERY DAY 30 tablet 1   No current facility-administered medications on file prior to visit.     PAST MEDICAL HISTORY: Past Medical History:  Diagnosis Date  . Anxiety   . Depression   . GERD (gastroesophageal reflux disease)   . Hypothyroidism   . IBS (irritable bowel syndrome)   . Joint pain   .  Lactose intolerance   . Obesity   . Sleep apnea     PAST SURGICAL HISTORY: Past Surgical History:  Procedure Laterality Date  . KNEE ARTHROSCOPY, MEDIAL PATELLO FEMORAL LIGAMENT REPAIR Right   . TONSILLECTOMY AND ADENOIDECTOMY      SOCIAL HISTORY: Social History   Tobacco Use  . Smoking status: Never Smoker  . Smokeless tobacco: Never Used  Substance Use Topics  . Alcohol use: Yes    Comment: occ.  . Drug use: Not on file    FAMILY HISTORY: Family History  Problem Relation Age of Onset  . Depression Mother   . Suicidality Mother   . Anxiety disorder Mother   . Bipolar disorder Mother   . Drug abuse Mother   . Obesity Mother   . Alcohol abuse Father   . Anxiety disorder Father   . Depression Father   . Bipolar disorder Father   . Schizophrenia Father   . Liver disease Father   . Drug abuse Father   . Colon polyps Paternal Grandmother   . Esophageal cancer Neg Hx   . Pancreatic cancer Neg Hx   . Stomach cancer Neg Hx     ROS: Review of Systems  Constitutional: Positive  for weight loss.  Gastrointestinal: Negative for nausea and vomiting.  Genitourinary: Negative for frequency.  Musculoskeletal:       Negative muscle weakness  Endo/Heme/Allergies: Negative for polydipsia.  Psychiatric/Behavioral: Positive for depression. Negative for suicidal ideas.    PHYSICAL EXAM: Blood pressure 100/64, pulse 68, temperature 98.1 F (36.7 C), temperature source Oral, height 5\' 10"  (1.778 m), weight 262 lb (118.8 kg), SpO2 99 %. Body mass index is 37.59 kg/m. Physical Exam Vitals signs reviewed.  Constitutional:      Appearance: Normal appearance. He is obese.  Cardiovascular:     Rate and Rhythm: Normal rate.     Pulses: Normal pulses.  Pulmonary:     Effort: Pulmonary effort is normal.     Breath sounds: Normal breath sounds.  Musculoskeletal: Normal range of motion.  Skin:    General: Skin is warm and dry.  Neurological:     Mental Status: He is alert and oriented to person, place, and time.  Psychiatric:        Mood and Affect: Mood normal.        Behavior: Behavior normal.     RECENT LABS AND TESTS: BMET    Component Value Date/Time   NA 141 05/07/2019 1028   K 4.4 05/07/2019 1028   CL 104 05/07/2019 1028   CO2 24 05/07/2019 1028   GLUCOSE 84 05/07/2019 1028   GLUCOSE 88 07/12/2018 1138   BUN 9 05/07/2019 1028   CREATININE 0.93 05/07/2019 1028   CREATININE 1.10 11/10/2017 1637   CALCIUM 9.4 05/07/2019 1028   GFRNONAA 114 05/07/2019 1028   GFRAA 131 05/07/2019 1028   Lab Results  Component Value Date   HGBA1C 4.9 05/07/2019   HGBA1C 5.0 07/12/2018   Lab Results  Component Value Date   INSULIN 12.3 05/07/2019   CBC    Component Value Date/Time   WBC 4.6 05/07/2019 1028   WBC 6.1 07/12/2018 1138   RBC 5.80 05/07/2019 1028   RBC 5.53 07/12/2018 1138   HGB 14.9 05/07/2019 1028   HCT 46.3 05/07/2019 1028   PLT 230 05/07/2019 1028   MCV 80 05/07/2019 1028   MCH 25.7 (L) 05/07/2019 1028   MCH 27.0 11/10/2017 1637   MCHC 32.2  05/07/2019 1028  MCHC 34.1 07/12/2018 1138   RDW 14.2 05/07/2019 1028   LYMPHSABS 1.5 05/07/2019 1028   EOSABS 0.1 05/07/2019 1028   BASOSABS 0.0 05/07/2019 1028   Iron/TIBC/Ferritin/ %Sat No results found for: IRON, TIBC, FERRITIN, IRONPCTSAT Lipid Panel     Component Value Date/Time   CHOL 124 05/07/2019 1028   TRIG 130 05/07/2019 1028   HDL 43 05/07/2019 1028   CHOLHDL 4 07/12/2018 1138   VLDL 50.6 (H) 07/12/2018 1138   LDLCALC 58 05/07/2019 1028   LDLDIRECT 99.0 07/12/2018 1138   Hepatic Function Panel     Component Value Date/Time   PROT 7.0 05/07/2019 1028   ALBUMIN 4.7 05/07/2019 1028   AST 31 05/07/2019 1028   ALT 38 05/07/2019 1028   ALKPHOS 86 05/07/2019 1028   BILITOT 0.5 05/07/2019 1028      Component Value Date/Time   TSH 4.980 (H) 05/07/2019 1028   TSH 4.81 (H) 07/12/2018 1138   TSH 4.53 (H) 11/10/2017 1637      OBESITY BEHAVIORAL INTERVENTION VISIT  Today's visit was # 3   Starting weight: 270 lbs Starting date: 05/07/2019 Today's weight : 262 lbs Today's date: 06/04/2019 Total lbs lost to date: 8    ASK: We discussed the diagnosis of obesity with Kent Griffith today and Kent Griffith agreed to give [place] permission to discuss obesity behavioral modification therapy today.  ASSESS: Kent Griffith has the diagnosis of obesity and his BMI today is 37.59 Kent Griffith is in the action stage of change   ADVISE: Kent Griffith was educated on the multiple health risks of obesity as well as the benefit of weight loss to improve his health. He was advised of the need for long term treatment and the importance of lifestyle modifications to improve his current health and to decrease his risk of future health problems.  AGREE: Multiple dietary modification options and treatment options were discussed and  Kent Griffith agreed to follow the recommendations documented in the above note.  ARRANGE: Kent Griffith was educated on the importance of frequent visits to treat obesity as outlined per CMS and USPSTF  guidelines and agreed to schedule his next follow up appointment today.  Kent Griffith, am acting as transcriptionist for Helane Rima, DO  I have reviewed the above documentation for accuracy and completeness, and I agree with the above. Helane Rima, DO

## 2019-06-10 ENCOUNTER — Encounter (INDEPENDENT_AMBULATORY_CARE_PROVIDER_SITE_OTHER): Payer: Self-pay | Admitting: Family Medicine

## 2019-06-13 ENCOUNTER — Encounter: Payer: Self-pay | Admitting: Family Medicine

## 2019-06-14 ENCOUNTER — Encounter (INDEPENDENT_AMBULATORY_CARE_PROVIDER_SITE_OTHER): Payer: Self-pay | Admitting: Family Medicine

## 2019-06-14 DIAGNOSIS — E559 Vitamin D deficiency, unspecified: Secondary | ICD-10-CM

## 2019-06-17 ENCOUNTER — Other Ambulatory Visit (INDEPENDENT_AMBULATORY_CARE_PROVIDER_SITE_OTHER): Payer: Self-pay | Admitting: Family Medicine

## 2019-06-17 ENCOUNTER — Ambulatory Visit: Payer: 59 | Admitting: Family Medicine

## 2019-06-17 DIAGNOSIS — E559 Vitamin D deficiency, unspecified: Secondary | ICD-10-CM

## 2019-06-17 MED ORDER — VITAMIN D (ERGOCALCIFEROL) 1.25 MG (50000 UNIT) PO CAPS: 50000.0000 [IU] | ORAL_CAPSULE | ORAL | 0 refills | Status: DC

## 2019-06-17 NOTE — Telephone Encounter (Signed)
Please advise 

## 2019-06-24 ENCOUNTER — Other Ambulatory Visit: Payer: Self-pay

## 2019-06-24 ENCOUNTER — Ambulatory Visit (INDEPENDENT_AMBULATORY_CARE_PROVIDER_SITE_OTHER): Payer: 59 | Admitting: Family Medicine

## 2019-06-24 ENCOUNTER — Encounter (INDEPENDENT_AMBULATORY_CARE_PROVIDER_SITE_OTHER): Payer: Self-pay | Admitting: Family Medicine

## 2019-06-24 VITALS — BP 128/76 | HR 85 | Temp 98.2°F | Ht 70.0 in | Wt 263.0 lb

## 2019-06-24 DIAGNOSIS — Z9189 Other specified personal risk factors, not elsewhere classified: Secondary | ICD-10-CM | POA: Diagnosis not present

## 2019-06-24 DIAGNOSIS — E88819 Insulin resistance, unspecified: Secondary | ICD-10-CM

## 2019-06-24 DIAGNOSIS — E559 Vitamin D deficiency, unspecified: Secondary | ICD-10-CM | POA: Diagnosis not present

## 2019-06-24 DIAGNOSIS — Z6837 Body mass index (BMI) 37.0-37.9, adult: Secondary | ICD-10-CM

## 2019-06-24 DIAGNOSIS — E538 Deficiency of other specified B group vitamins: Secondary | ICD-10-CM

## 2019-06-24 DIAGNOSIS — F3289 Other specified depressive episodes: Secondary | ICD-10-CM | POA: Diagnosis not present

## 2019-06-24 DIAGNOSIS — E038 Other specified hypothyroidism: Secondary | ICD-10-CM

## 2019-06-24 DIAGNOSIS — E8881 Metabolic syndrome: Secondary | ICD-10-CM | POA: Diagnosis not present

## 2019-06-24 DIAGNOSIS — E039 Hypothyroidism, unspecified: Secondary | ICD-10-CM

## 2019-06-24 DIAGNOSIS — K219 Gastro-esophageal reflux disease without esophagitis: Secondary | ICD-10-CM | POA: Diagnosis not present

## 2019-06-24 MED ORDER — VITAMIN D (ERGOCALCIFEROL) 1.25 MG (50000 UNIT) PO CAPS: 50000.0000 [IU] | ORAL_CAPSULE | ORAL | 0 refills | Status: DC

## 2019-06-24 MED ORDER — RYBELSUS 14 MG PO TABS: 14.0000 mg | ORAL_TABLET | Freq: Every day | ORAL | 0 refills | Status: DC

## 2019-06-25 NOTE — Progress Notes (Signed)
Office: [phone]  /  Fax: [phone]   HPI:  Chief Complaint: OBESITY Kent Griffith is here to discuss his progress with his obesity treatment plan. He is keeping a food journal with 1500 calories and 90 grams of protein and states he is following his eating plan approximately 30 to 40 % of the time. He states he is doing calisthenics for 15 minutes 2 times per week.  Holiday eating went well, but Kent Griffith had increased stress after so admits to some emotional eating. His mother is going through a divorce.  Today's visit was # 4 Starting weight: 270 lbs Starting date: 05/07/19 Today's weight : Weight: 263 lb (119.3 kg)  Today's date: 06/24/2019 Total lbs lost to date: 7 Total lbs lost since last in-office visit: 0  Vitamin D Deficiency Labs were discussed with Kent Griffith today.  Insulin Resistance Kent Griffith is taking metformin and Rybelsus.  At risk for diabetes Kent Griffith is at higher than average risk for developing diabetes due to his insulin resistance and obesity.   Emotional Eating Behaviors (other depression) Kent Griffith is taking Wellbutrin and seeing Dr. Dewaine CongerBarker.  Vitamin B12 Deficiency Kent Griffith is taking metformin which decreases absorption.   Subclinical Hypothyroid Kent Griffith has a history of TSH greater than 10, but now his level is between 4 and 5.  Gastroesophageal Reflux Disease Kent Griffith is on omeprazole 20 mg and he must take daily or he will have increased reflux.  ASSESSMENT AND PLAN:  Vitamin D deficiency - Plan: Vitamin D, Ergocalciferol, (DRISDOL) 1.25 MG (50000 UT) CAPS capsule  Vitamin B12 deficiency  Subclinical hypothyroidism  Gastroesophageal reflux disease without esophagitis  Insulin resistance - Plan: Semaglutide (RYBELSUS) 14 MG TABS  Other depression, with emotional eating  At risk for diabetes mellitus  Class 2 severe obesity with serious comorbidity and body mass index (BMI) of 37.0 to 37.9 in adult, unspecified obesity type (HCC)  PLAN:  Vitamin D Deficiency Kent Griffith  was informed that low vitamin D levels contribute to fatigue and are associated with obesity, breast, and colon cancer. Kent Griffith agrees to continue to take prescription Vit D @50 ,000 IU weekly #4 with no refills and will follow up for routine testing of vitamin D, at least 2-3 times per year. He was informed of the risk of over-replacement of vitamin D and agrees to not increase his dose unless he discusses this with us first. Kent Griffith agrees to follow up in 2 weeks as directed.  Insulin Resistance Kent Griffith will continue to work on weight loss, exercise, and decreasing simple carbohydrates to help decrease the risk of diabetes. Kent Griffith has agreed to increase his Rybelsus to 14 mg PO daily #30 with no refills. Kent Griffith agreed to follow up with us as directed to closely monitor his progress.  Diabetes risk counseling (~15 min) Kent Griffith is a 23 y.o. male and has risk factors for diabetes including insulin resistance and obesity. We discussed intensive lifestyle modifications today with an emphasis on weight loss as well as increasing exercise and decreasing simple carbohydrates in his diet.  Emotional Eating Behaviors (other depression) Behavior modification techniques were discussed today to help Kent Griffith deal with his emotional/non-hunger eating behaviors. Will continue to follow and monitor his progress.  Vitamin B12 Deficiency Kent Griffith will consider a B12 supplement and will follow up at the agreed upon time.  Subclinical Hypothyroid We will monitor Kent Griffith and he will return in 2 weeks.  Gastroesophageal Reflux Disease Kent Griffith agrees to continue taking his omeprazole and will follow up at the agreed upon time.  Obesity Kent Griffith  is currently in the action stage of change. As such, his goal is to continue with weight loss efforts. He has agreed to keep a food journal with 1500 calories and 90 grams of protein.  Kent Griffith has been instructed to work up to a goal of 150 minutes of combined cardio and strengthening exercise per week for  weight loss and overall health benefits. We discussed the following Behavioral Modification Strategies today: decreasing simple carbohydrates, dealing with family or coworker sabotage, holiday eating strategies, emotional eating strategies, and avoiding temptations.  Kent Griffith has agreed to follow up with our clinic in 2 weeks. He was informed of the importance of frequent follow up visits to maximize his success with intensive lifestyle modifications for his multiple health conditions.  ALLERGIES: Allergies  Allergen Reactions  . Penicillins Anaphylaxis, Hives, Itching and Rash   MEDICATIONS: Current Outpatient Medications on File Prior to Visit  Medication Sig Dispense Refill  . buPROPion (WELLBUTRIN XL) 300 MG 24 hr tablet Take 1 tablet (300 mg total) by mouth daily. 90 tablet 3  . metFORMIN (GLUCOPHAGE XR) 500 MG 24 hr tablet Take 3 tablets (1,500 mg total) by mouth daily with breakfast. 270 tablet 3  . Multiple Vitamins-Minerals (MULTIVITAMIN WITH MINERALS) tablet Take 1 tablet by mouth daily.    Marland Kitchen omeprazole (PRILOSEC) 20 MG capsule TAKE ONE CAPSULE BY MOUTH DAILY 90 capsule 0   No current facility-administered medications on file prior to visit.    PAST MEDICAL HISTORY: Past Medical History:  Diagnosis Date  . Anxiety   . Depression   . GERD (gastroesophageal reflux disease)   . Hypothyroidism   . IBS (irritable bowel syndrome)   . Joint pain   . Lactose intolerance   . Obesity   . Sleep apnea     PAST SURGICAL HISTORY: Past Surgical History:  Procedure Laterality Date  . KNEE ARTHROSCOPY, MEDIAL PATELLO FEMORAL LIGAMENT REPAIR Right   . TONSILLECTOMY AND ADENOIDECTOMY      SOCIAL HISTORY: Social History   Tobacco Use  . Smoking status: Never Smoker  . Smokeless tobacco: Never Used  Substance Use Topics  . Alcohol use: Yes    Comment: occ.  . Drug use: Not on file    FAMILY HISTORY: Family History  Problem Relation Age of Onset  . Depression Mother   .  Suicidality Mother   . Anxiety disorder Mother   . Bipolar disorder Mother   . Drug abuse Mother   . Obesity Mother   . Alcohol abuse Father   . Anxiety disorder Father   . Depression Father   . Bipolar disorder Father   . Schizophrenia Father   . Liver disease Father   . Drug abuse Father   . Colon polyps Paternal Grandmother   . Esophageal cancer Neg Hx   . Pancreatic cancer Neg Hx   . Stomach cancer Neg Hx     ROS: Review of Systems  Constitutional: Negative for weight loss.    PHYSICAL EXAM: Blood pressure 128/76, pulse 85, temperature 98.2 F (36.8 C), temperature source Oral, height 5\' 10"  (1.778 m), weight 263 lb (119.3 kg), SpO2 97 %. Body mass index is 37.74 kg/m. Physical Exam  RECENT LABS AND TESTS: BMET    Component Value Date/Time   NA 141 05/07/2019 1028   K 4.4 05/07/2019 1028   CL 104 05/07/2019 1028   CO2 24 05/07/2019 1028   GLUCOSE 84 05/07/2019 1028   GLUCOSE 88 07/12/2018 1138   BUN 9 05/07/2019 1028  CREATININE 0.93 05/07/2019 1028   CREATININE 1.10 11/10/2017 1637   CALCIUM 9.4 05/07/2019 1028   GFRNONAA 114 05/07/2019 1028   GFRAA 131 05/07/2019 1028   Lab Results  Component Value Date   HGBA1C 4.9 05/07/2019   HGBA1C 5.0 07/12/2018   Lab Results  Component Value Date   INSULIN 12.3 05/07/2019   CBC    Component Value Date/Time   WBC 4.6 05/07/2019 1028   WBC 6.1 07/12/2018 1138   RBC 5.80 05/07/2019 1028   RBC 5.53 07/12/2018 1138   HGB 14.9 05/07/2019 1028   HCT 46.3 05/07/2019 1028   PLT 230 05/07/2019 1028   MCV 80 05/07/2019 1028   MCH 25.7 (L) 05/07/2019 1028   MCH 27.0 11/10/2017 1637   MCHC 32.2 05/07/2019 1028   MCHC 34.1 07/12/2018 1138   RDW 14.2 05/07/2019 1028   LYMPHSABS 1.5 05/07/2019 1028   EOSABS 0.1 05/07/2019 1028   BASOSABS 0.0 05/07/2019 1028   Iron/TIBC/Ferritin/ %Sat No results found for: IRON, TIBC, FERRITIN, IRONPCTSAT Lipid Panel     Component Value Date/Time   CHOL 124 05/07/2019 1028     TRIG 130 05/07/2019 1028   HDL 43 05/07/2019 1028   CHOLHDL 4 07/12/2018 1138   VLDL 50.6 (H) 07/12/2018 1138   LDLCALC 58 05/07/2019 1028   LDLDIRECT 99.0 07/12/2018 1138   Hepatic Function Panel     Component Value Date/Time   PROT 7.0 05/07/2019 1028   ALBUMIN 4.7 05/07/2019 1028   AST 31 05/07/2019 1028   ALT 38 05/07/2019 1028   ALKPHOS 86 05/07/2019 1028   BILITOT 0.5 05/07/2019 1028      Component Value Date/Time   TSH 4.980 (H) 05/07/2019 1028   TSH 4.81 (H) 07/12/2018 1138   TSH 4.53 (H) 11/10/2017 1637   Results for ROPER, TOLSON (MRN [id number]) as of 06/25/2019 16:46  Ref. Range 05/07/2019 10:28  Vitamin D, 25-Hydroxy Latest Ref Range: 30.0 - 100.0 ng/mL 31.7    OBESITY BEHAVIORAL INTERVENTION VISIT DOCUMENTATION FOR INSURANCE (~15 minutes)  I, Marcille Blanco, CMA, am acting as transcriptionist for Briscoe Deutscher, DO  I have reviewed the above documentation for accuracy and completeness, and I agree with the above. Briscoe Deutscher, DO

## 2019-06-27 ENCOUNTER — Encounter (INDEPENDENT_AMBULATORY_CARE_PROVIDER_SITE_OTHER): Payer: Self-pay | Admitting: Family Medicine

## 2019-07-16 ENCOUNTER — Other Ambulatory Visit: Payer: Self-pay

## 2019-07-16 ENCOUNTER — Ambulatory Visit (INDEPENDENT_AMBULATORY_CARE_PROVIDER_SITE_OTHER): Payer: 59 | Admitting: Family Medicine

## 2019-07-16 ENCOUNTER — Encounter (INDEPENDENT_AMBULATORY_CARE_PROVIDER_SITE_OTHER): Payer: Self-pay | Admitting: Family Medicine

## 2019-07-16 VITALS — HR 80 | Temp 98.3°F | Ht 70.0 in | Wt 259.0 lb

## 2019-07-16 DIAGNOSIS — Z6837 Body mass index (BMI) 37.0-37.9, adult: Secondary | ICD-10-CM

## 2019-07-16 DIAGNOSIS — E538 Deficiency of other specified B group vitamins: Secondary | ICD-10-CM | POA: Diagnosis not present

## 2019-07-16 DIAGNOSIS — F3289 Other specified depressive episodes: Secondary | ICD-10-CM | POA: Diagnosis not present

## 2019-07-16 DIAGNOSIS — K219 Gastro-esophageal reflux disease without esophagitis: Secondary | ICD-10-CM

## 2019-07-16 DIAGNOSIS — E559 Vitamin D deficiency, unspecified: Secondary | ICD-10-CM

## 2019-07-16 DIAGNOSIS — E8881 Metabolic syndrome: Secondary | ICD-10-CM

## 2019-07-16 DIAGNOSIS — Z9189 Other specified personal risk factors, not elsewhere classified: Secondary | ICD-10-CM

## 2019-07-18 NOTE — Progress Notes (Signed)
Chief Complaint: Kent Griffith is here to discuss his progress with his obesity treatment plan along with follow-up of his obesity related diagnoses. Kent Griffith is keeping a food journal and adhering to recommended goals of 1500-1800 calories and 90g protein  and states he is following his eating plan approximately 85-90% of the time. Kent Griffith states he is walking 1 mile 5 times per week.  Today's visit was #: 5 Starting weight: 270 lbs Starting date: 05/07/2019 Today's weight: 259 lbs Today's date: 07/18/2019 Total lbs lost to date: 11 lbs Total lbs lost since last in-office visit: 4 lbs  Interim History:  Kent Griffith's Rybelsus dose was increased to 14 mg daily after his last visit.  He says he cannot feel a difference.  He is adhering to his diet plan well.  Most days he consumes 1500-1600 calories.  He has been craving cereal lately (Raisin Bran).  He has been doing well incorporating this into his diet.  He will be seeing his PCP next week and expects to have labs.  Subjective:   1. Vitamin D deficiency Kent Griffith has a diagnosis of vitamin D deficiency. He is currently taking vit D. He denies nausea, vomiting or muscle weakness.  Last vitamin D was 31.7 on 05/07/2019.  2. Vitamin B12 deficiency Last B12 level was 317 on 05/07/2019.  He has started taking a vitamin B complex vitamin.  3. Insulin resistance Kent Griffith's last insulin level was 12.3 on 05/07/2019.  He is stable on Rybelsus with no side effects.  4. Gastroesophageal reflux disease without esophagitis Currently taking Prilosec. Gastrointestinal ROS: no abdominal pain, change in bowel habits, or black or bloody stools.  5. Other depression, with emotional eating Kent Griffith has anxiety related to overeating.  Craves cereal and ice cream.    6. At risk for diabetes mellitus Kent Griffith is at higher than average risk for developing diabetes due to his obesity.   Assessment/Plan:   1. Vitamin D deficiency Low Vitamin D level contributes to fatigue  and are associated with obesity, breast, and colon cancer. He agrees to continue to take prescription Vitamin D @50 ,000 IU every week and will follow-up for routine testing of vitamin D, at least 2-3 times per year to avoid over-replacement.  - Vitamin D, Ergocalciferol, (DRISDOL) 1.25 MG (50000 UT) CAPS capsule; Take 1 capsule (50,000 Units total) by mouth every 7 (seven) days.  Dispense: 4 capsule; Refill: 0  2. Vitamin B12 deficiency This problem is chronic.  The diagnosis was reviewed with the patient. Will monitor.  3. Insulin resistance Kent Griffith will continue to work on weight loss, exercise, and decreasing simple carbohydrates to help decrease the risk of diabetes. Kent Griffith agreed to follow-up with [place] as directed to closely monitor his progress.  4. Gastroesophageal reflux disease without esophagitis Stable at this time.  Will continue to monitor.  5. Other depression, with emotional eating Behavior modification techniques were discussed today to help Kent Griffith deal with his emotional/non-hunger eating behaviors.  Orders and follow up as documented in patient record.  Medication(s): Wellbutrin, metformin, Rybelsus.  Will consider addition of Contrave if patient stalls or cravings worsen.  6. At risk for diabetes mellitus Kent Griffith is a 24 y.o. male and has risk factors for diabetes including obesity. We discussed intensive lifestyle modifications today with an emphasis on weight loss as well as increasing exercise and decreasing simple carbohydrates in his diet. (15 minutes)  7. Class 2 severe obesity with serious comorbidity and body mass index (BMI) of 37.0 to 37.9  in adult, unspecified obesity type Kent Griffith) Kent Griffith is currently in the action stage of change. As such, his goal is to continue with weight loss efforts. He has agreed to keeping a food journal and adhering to recommended goals of 1500-1800 calories and 90g protein .   We discussed the following exercise goals today: For substantial health  benefits, adults should do at least 150 minutes (2 hours and 30 minutes) a week of moderate-intensity, or 75 minutes (1 hour and 15 minutes) a week of vigorous-intensity aerobic physical activity, or an equivalent combination of moderate- and vigorous-intensity aerobic activity. Aerobic activity should be performed in episodes of at least 10 minutes, and preferably, it should be spread throughout the week. Adults should also include muscle-strengthening activities that involve all major muscle groups on 2 or more days a week.  We discussed the following behavioral modification strategies today: ways to avoid night time snacking, emotional eating strategies and dealing with family or coworker sabotage.  Kent Griffith has agreed to follow-up with our clinic in 2 weeks. He was informed of the importance of frequent follow-up visits to maximize his success with intensive lifestyle modifications for his multiple health conditions.  Objective:   Pulse 80, temperature 98.3 F (36.8 C), temperature source Oral, height 5\' 10"  (1.778 m), weight 259 lb (117.5 kg), SpO2 98 %. Body mass index is 37.16 kg/m.  General: Cooperative, alert, well developed, in no acute distress. HEENT: Conjunctivae and lids unremarkable. Neck: No thyromegaly.  Cardiovascular: Regular rhythm.  Lungs: Normal work of breathing. Extremities: No edema.  Neurologic: No focal deficits.   Lab Results  Component Value Date   CREATININE 0.93 05/07/2019   BUN 9 05/07/2019   NA 141 05/07/2019   K 4.4 05/07/2019   CL 104 05/07/2019   CO2 24 05/07/2019   Lab Results  Component Value Date   ALT 38 05/07/2019   AST 31 05/07/2019   ALKPHOS 86 05/07/2019   BILITOT 0.5 05/07/2019   Lab Results  Component Value Date   HGBA1C 4.9 05/07/2019   HGBA1C 5.0 07/12/2018   Lab Results  Component Value Date   INSULIN 12.3 05/07/2019   Lab Results  Component Value Date   TSH 4.980 (H) 05/07/2019   Lab Results  Component Value Date     CHOL 124 05/07/2019   HDL 43 05/07/2019   LDLCALC 58 05/07/2019   LDLDIRECT 99.0 07/12/2018   TRIG 130 05/07/2019   CHOLHDL 4 07/12/2018   Lab Results  Component Value Date   WBC 4.6 05/07/2019   HGB 14.9 05/07/2019   HCT 46.3 05/07/2019   MCV 80 05/07/2019   PLT 230 05/07/2019   No results found for: IRON, TIBC, FERRITIN  Attestation Statements:   Reviewed by clinician on day of visit: allergies, medications, problem list, medical history, surgical history, family history, social history and previous encounter notes.  This visit occurred during the SARS-CoV-2 public health emergency. Safety protocols were in place, including screening questions prior to the visit, additional usage of staff PPE, and extensive cleaning of exam room while observing appropriate contact time as indicated for disinfecting solutions. (CPT 05/09/2019)  I, [id number], CMA, am acting as transcriptionist for Insurance claims handler, DO.  I have reviewed the above documentation for accuracy and completeness, and I agree with the above. [employer], DO

## 2019-07-19 ENCOUNTER — Encounter: Payer: 59 | Attending: Family Medicine | Admitting: Dietician

## 2019-07-19 ENCOUNTER — Encounter (INDEPENDENT_AMBULATORY_CARE_PROVIDER_SITE_OTHER): Payer: Self-pay | Admitting: Family Medicine

## 2019-07-19 ENCOUNTER — Other Ambulatory Visit (INDEPENDENT_AMBULATORY_CARE_PROVIDER_SITE_OTHER): Payer: Self-pay | Admitting: Family Medicine

## 2019-07-19 ENCOUNTER — Encounter: Payer: Self-pay | Admitting: Dietician

## 2019-07-19 ENCOUNTER — Other Ambulatory Visit: Payer: Self-pay

## 2019-07-19 DIAGNOSIS — E8881 Metabolic syndrome: Secondary | ICD-10-CM

## 2019-07-19 MED ORDER — VITAMIN D (ERGOCALCIFEROL) 1.25 MG (50000 UNIT) PO CAPS: 50000.0000 [IU] | ORAL_CAPSULE | ORAL | 0 refills | Status: DC

## 2019-07-19 NOTE — Progress Notes (Addendum)
Medical Nutrition Therapy:  Appt start time: 1300 end time:  2353.   Assessment:  Primary concerns today: .  Kent Griffith is here today alone for follow up regarding weight management. His formal visit was 02/22/2019 but have spoken informally since. He states that he struggled a lot around Thanksgiving but is doing better now. He is trying to  avoid "all or nothing eating" He continues to go to Dr. Migdalia Dk weight management clinic.  Weight: 258 lbs 07/18/2018  316 lbs 07/12/2018 Goal approximately 210 lbs. He lost to 252 lbs in the past.  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 Maintained most of that weight until January 2019 increasing to the 270's. Increased stress with moving, buying a home, wife newly pregnant and started gaining weight with increased eating out and readopting the bad habits. Started Celexa May 2019 for anxiety and gained about 26 lbs.   Changed to Ozempic and recently Rybelsus for appetite control.  He has lost 15 lbs in the past 2 months. Denies stress eating.  Does not say no to food when he is full or when food is present in the office.  Eats out of boredom. Picks up groceries from on line shopping at [place], occasional [employer] or [employer]. They plan meals as they are making the grocery list. Eats very fast.    07/12/2018 labs noted: Cholesterol 147, HDL 34, LDL 99, Triglycerides 253, A1C 5.0%. 05/07/2019 labs:  Cholesterol 124, HDL 43, LDL 58, Triglycerides 130, vitamin D 31.7, Vitamin B-12 317, A1C 4.9%, insulin 12.3, folate 15.2  Body Composition Scale Date  07/19/19     Total Body Fat % 29.9  Visceral Fat 17  Fat-Free Mass % 70   Total Body Water % 51   Muscle-Mass lbs 48.7   Body Fat Displacement          Torso  lbs 48         Left Leg  lbs 9.6         Right Leg  lbs 9.6         Left Arm  lbs 4.8         Right Arm   lbs 4.8   Body Composition Scale Date    05/09/19  Total Body Fat % 31.6  Visceral Fat 19   Fat-Free Mass % 68.3   Total Body Water % 49.3   Muscle-Mass lbs 51.1  Body Fat Displacement          Torso  lbs 53.3         Left Leg  lbs 10.6         Right Leg  lbs 10.6         Left Arm  lbs 5.3         Right Arm   lbs 5.3      Noted that Vitamin D was low and he has started on a supplement. Vitamin B-12 low normal.  Cholesterol labs improved.  He has lost 58 lbs in the past year and 10 lbs of fat in the last 2 months.  He lives with his wife and 80 month old son.  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  Preferred Learning Style:   No preference indicated   Learning Readiness:   Ready  Change in progress   MEDICATIONS: Rybelsus for appetite control, Vitamin D   DIETARY INTAKE: Only eats ground meat or boneless, skinless chicken breast.  Uses steamable frozen vegetables but states that he is picky about vegetables. (green beans or broccoli generally). Salad more recently. Dislikes greasy food. Lactose intolerant  Breakfast:  [place] Yogurt, Dorice Lamas 40 calorie multigrain bread, 1 T PB, 1 cup Fairlife milk, coffee with 3 pks splenda  Lunch:  Chicken Salad Chik - Loaded potato soup and broccoli salad, macaroon Snack:  Geographical information systems officer:  2 eggs, 3 oz sausage, 1 1/2 canned biscuits OR Chili   1538 calories He is tracking his intake in [place] and getting daily mindfulness options.  Physical Activity:  He is not exercising currently but is aiming to walk during lunch.  States that he lacks motivation.  Estimated energy needs: 2200 calories 95-105 g protein  Progress Towards Goal(s):  In progress.   Nutritional Diagnosis:  NB-1.1 Food and nutrition-related knowledge deficit As related to mindful eating.  As evidenced by patient report.    Intervention:  Nutrition counseling/education related to mindfulness continued.  Discussed balanced meals and 3 component meal for increased satisfaction.  Teaching Method Utilized:  Auditory   Barriers to learning/adherence to  lifestyle change: none  Demonstrated degree of understanding via:  Teach Back   Monitoring/Evaluation:  Dietary intake, exercise, and body weight in 3 month(s).

## 2019-07-23 ENCOUNTER — Other Ambulatory Visit: Payer: Self-pay

## 2019-07-24 ENCOUNTER — Ambulatory Visit (INDEPENDENT_AMBULATORY_CARE_PROVIDER_SITE_OTHER): Payer: 59 | Admitting: Family Medicine

## 2019-07-24 ENCOUNTER — Encounter: Payer: Self-pay | Admitting: Family Medicine

## 2019-07-24 ENCOUNTER — Encounter (INDEPENDENT_AMBULATORY_CARE_PROVIDER_SITE_OTHER): Payer: Self-pay | Admitting: Family Medicine

## 2019-07-24 VITALS — BP 118/74 | HR 83 | Temp 97.0°F | Ht 70.0 in | Wt 256.0 lb

## 2019-07-24 DIAGNOSIS — F419 Anxiety disorder, unspecified: Secondary | ICD-10-CM

## 2019-07-24 DIAGNOSIS — K219 Gastro-esophageal reflux disease without esophagitis: Secondary | ICD-10-CM | POA: Diagnosis not present

## 2019-07-24 DIAGNOSIS — Z6836 Body mass index (BMI) 36.0-36.9, adult: Secondary | ICD-10-CM | POA: Diagnosis not present

## 2019-07-24 DIAGNOSIS — E039 Hypothyroidism, unspecified: Secondary | ICD-10-CM

## 2019-07-24 DIAGNOSIS — Z0001 Encounter for general adult medical examination with abnormal findings: Secondary | ICD-10-CM | POA: Diagnosis not present

## 2019-07-24 DIAGNOSIS — F321 Major depressive disorder, single episode, moderate: Secondary | ICD-10-CM | POA: Diagnosis not present

## 2019-07-24 LAB — TESTOSTERONE: Testosterone: 308.94 ng/dL (ref 300.00–890.00)

## 2019-07-24 LAB — COMPREHENSIVE METABOLIC PANEL
ALT: 20 U/L (ref 0–53)
AST: 17 U/L (ref 0–37)
Albumin: 4.9 g/dL (ref 3.5–5.2)
Alkaline Phosphatase: 70 U/L (ref 39–117)
BUN: 15 mg/dL (ref 6–23)
CO2: 28 mEq/L (ref 19–32)
Calcium: 9.4 mg/dL (ref 8.4–10.5)
Chloride: 105 mEq/L (ref 96–112)
Creatinine, Ser: 1.08 mg/dL (ref 0.40–1.50)
GFR: 83.13 mL/min (ref >60.00)
Glucose, Bld: 90 mg/dL (ref 70–99)
Potassium: 4.1 mEq/L (ref 3.5–5.1)
Sodium: 140 mEq/L (ref 135–145)
Total Bilirubin: 0.7 mg/dL (ref 0.2–1.2)
Total Protein: 7.2 g/dL (ref 6.0–8.3)

## 2019-07-24 LAB — TSH: TSH: 4.03 u[IU]/mL (ref 0.35–4.50)

## 2019-07-24 LAB — CBC
HCT: 44.3 % (ref 39.0–52.0)
Hemoglobin: 15.2 g/dL (ref 13.0–17.0)
MCHC: 34.4 g/dL (ref 30.0–36.0)
MCV: 78.9 fl (ref 78.0–100.0)
Platelets: 172 10*3/uL (ref 150.0–400.0)
RBC: 5.62 Mil/uL (ref 4.22–5.81)
RDW: 14.2 % (ref 11.5–15.5)
WBC: 4.8 10*3/uL (ref 4.0–10.5)

## 2019-07-24 LAB — LIPID PANEL
Cholesterol: 115 mg/dL (ref 0–200)
HDL: 37.8 mg/dL — ABNORMAL LOW (ref >39.00)
LDL Cholesterol: 65 mg/dL (ref 0–99)
NonHDL: 77.15
Total CHOL/HDL Ratio: 3
Triglycerides: 61 mg/dL (ref 0.0–149.0)
VLDL: 12.2 mg/dL (ref 0.0–40.0)

## 2019-07-24 NOTE — Assessment & Plan Note (Signed)
Patient down about 50 pounds since our last visit.  Congratulated him on his hard work.  We will continue Rybelsus 14 mg daily.  Continue Metformin 1500 mg daily.  He will continue seeing weight management.

## 2019-07-24 NOTE — Assessment & Plan Note (Signed)
Stable.  Continue Prilosec 20 mg daily. 

## 2019-07-24 NOTE — Assessment & Plan Note (Signed)
Stable.  Continue Wellbutrin 300 mg daily. 

## 2019-07-24 NOTE — Patient Instructions (Signed)
It was very nice to see you today!  Keep up the good work!  We will check blood work today.  Come back in 1 year for your next checkup, or sooner if needed.  Take care, Dr Jerline Pain  Please try these tips to maintain a healthy lifestyle:   Eat at least 3 REAL meals and 1-2 snacks per day.  Aim for no more than 5 hours between eating.  If you eat breakfast, please do so within one hour of getting up.    Each meal should contain half fruits/vegetables, one quarter protein, and one quarter carbs (no bigger than a computer mouse)   Cut down on sweet beverages. This includes juice, soda, and sweet tea.     Drink at least 1 glass of water with each meal and aim for at least 8 glasses per day   Exercise at least 150 minutes every week.    Preventive Care 32-43 Years Old, Male Preventive care refers to lifestyle choices and visits with your health care provider that can promote health and wellness. This includes:  A yearly physical exam. This is also called an annual well check.  Regular dental and eye exams.  Immunizations.  Screening for certain conditions.  Healthy lifestyle choices, such as eating a healthy diet, getting regular exercise, not using drugs or products that contain nicotine and tobacco, and limiting alcohol use. What can I expect for my preventive care visit? Physical exam Your health care provider will check:  Height and weight. These may be used to calculate body mass index (BMI), which is a measurement that tells if you are at a healthy weight.  Heart rate and blood pressure.  Your skin for abnormal spots. Counseling Your health care provider may ask you questions about:  Alcohol, tobacco, and drug use.  Emotional well-being.  Home and relationship well-being.  Sexual activity.  Eating habits.  Work and work Statistician. What immunizations do I need?  Influenza (flu) vaccine  This is recommended every year. Tetanus, diphtheria, and  pertussis (Tdap) vaccine  You may need a Td booster every 10 years. Varicella (chickenpox) vaccine  You may need this vaccine if you have not already been vaccinated. Human papillomavirus (HPV) vaccine  If recommended by your health care provider, you may need three doses over 6 months. Measles, mumps, and rubella (MMR) vaccine  You may need at least one dose of MMR. You may also need a second dose. Meningococcal conjugate (MenACWY) vaccine  One dose is recommended if you are 25-32 years old and a Market researcher living in a residence hall, or if you have one of several medical conditions. You may also need additional booster doses. Pneumococcal conjugate (PCV13) vaccine  You may need this if you have certain conditions and were not previously vaccinated. Pneumococcal polysaccharide (PPSV23) vaccine  You may need one or two doses if you smoke cigarettes or if you have certain conditions. Hepatitis A vaccine  You may need this if you have certain conditions or if you travel or work in places where you may be exposed to hepatitis A. Hepatitis B vaccine  You may need this if you have certain conditions or if you travel or work in places where you may be exposed to hepatitis B. Haemophilus influenzae type b (Hib) vaccine  You may need this if you have certain risk factors. You may receive vaccines as individual doses or as more than one vaccine together in one shot (combination vaccines). Talk with your health  care provider about the risks and benefits of combination vaccines. What tests do I need? Blood tests  Lipid and cholesterol levels. These may be checked every 5 years starting at age 60.  Hepatitis C test.  Hepatitis B test. Screening   Diabetes screening. This is done by checking your blood sugar (glucose) after you have not eaten for a while (fasting).  Sexually transmitted disease (STD) testing. Talk with your health care provider about your test results,  treatment options, and if necessary, the need for more tests. Follow these instructions at home: Eating and drinking   Eat a diet that includes fresh fruits and vegetables, whole grains, lean protein, and low-fat dairy products.  Take vitamin and mineral supplements as recommended by your health care provider.  Do not drink alcohol if your health care provider tells you not to drink.  If you drink alcohol: ? Limit how much you have to 0-2 drinks a day. ? Be aware of how much alcohol is in your drink. In the [place] one drink equals one 12 oz bottle of beer (355 mL), one 5 oz glass of wine (148 mL), or one 1 oz glass of hard liquor (44 mL). Lifestyle  Take daily care of your teeth and gums.  Stay active. Exercise for at least 30 minutes on 5 or more days each week.  Do not use any products that contain nicotine or tobacco, such as cigarettes, e-cigarettes, and chewing tobacco. If you need help quitting, ask your health care provider.  If you are sexually active, practice safe sex. Use a condom or other form of protection to prevent STIs (sexually transmitted infections). What's next?  Go to your health care provider once a year for a well check visit.  Ask your health care provider how often you should have your eyes and teeth checked.  Stay up to date on all vaccines. This information is not intended to replace advice given to you by your health care provider. Make sure you discuss any questions you have with your health care provider. Document Revised: 06/21/2018 Document Reviewed: 06/21/2018 Elsevier Patient Education  2020 [employer].

## 2019-07-24 NOTE — Assessment & Plan Note (Signed)
Check TSH 

## 2019-07-24 NOTE — Assessment & Plan Note (Signed)
Stable

## 2019-07-24 NOTE — Progress Notes (Signed)
Chief Complaint:  Kent Griffith is a 24 y.o. male who presents today for his annual comprehensive physical exam.    Assessment/Plan:  Chronic Problems Addressed Today: Morbid obesity (HCC) Patient down about 50 pounds since our last visit.  Congratulated him on his hard work.  We will continue Rybelsus 14 mg daily.  Continue Metformin 1500 mg daily.  He will continue seeing weight management.  Anxiety Stable.  Depression, major, single episode, moderate (HCC) Stable.  Continue Wellbutrin 300 mg daily.  GERD (gastroesophageal reflux disease) Stable.  Continue Prilosec 20 mg daily.  Hypothyroidism Check TSH.   Body mass index is 36.73 kg/m. / Obese BMI Metric Follow Up - 07/24/19 0846      BMI Metric Follow Up-Please document annually   BMI Metric Follow Up  Education provided        Preventative Healthcare: UTD on vaccines and screenings.   Patient Counseling(The following topics were reviewed and/or handout was given):  -Nutrition: Stressed importance of moderation in sodium/caffeine intake, saturated fat and cholesterol, caloric balance, sufficient intake of fresh fruits, vegetables, and fiber.  -Stressed the importance of regular exercise.   -Substance Abuse: Discussed cessation/primary prevention of tobacco, alcohol, or other drug use; driving or other dangerous activities under the influence; availability of treatment for abuse.   -Injury prevention: Discussed safety belts, safety helmets, smoke detector, smoking near bedding or upholstery.   -Sexuality: Discussed sexually transmitted diseases, partner selection, use of condoms, avoidance of unintended pregnancy and contraceptive alternatives.   -Dental health: Discussed importance of regular tooth brushing, flossing, and dental visits.  -Health maintenance and immunizations reviewed. Please refer to Health maintenance section.  Return to care in 1 year for next preventative visit.     Subjective:  HPI:  He has no  acute complaints today.   Lifestyle Diet: Balanced. Tries to eat a healthy diet. Plenty of fruits and vegetables.  Exercise: Limited. Tries to walk 20-30 minutes at a time.   Depression screen PHQ 2/9 05/07/2019  Decreased Interest 1  Down, Depressed, Hopeless 2  PHQ - 2 Score 3  Altered sleeping 1  Tired, decreased energy 3  Change in appetite 2  Feeling bad or failure about yourself  3  Trouble concentrating 2  Moving slowly or fidgety/restless 0  Suicidal thoughts 0  PHQ-9 Score 14  Difficult doing work/chores Not difficult at all   There are no preventive care reminders to display for this patient.   ROS: Per HPI, otherwise a complete review of systems was negative.   PMH:  The following were reviewed and entered/updated in epic: Past Medical History:  Diagnosis Date  . Anxiety   . Depression   . GERD (gastroesophageal reflux disease)   . Hypothyroidism   . IBS (irritable bowel syndrome)   . Joint pain   . Lactose intolerance   . Obesity   . Sleep apnea    Patient Active Problem List   Diagnosis Date Noted  . Morbid obesity (HCC) 12/12/2017  . Hypothyroidism 11/10/2017  . GERD (gastroesophageal reflux disease) 11/10/2017  . Depression, major, single episode, moderate (HCC) 11/10/2017  . Anxiety 11/10/2017   Past Surgical History:  Procedure Laterality Date  . KNEE ARTHROSCOPY, MEDIAL PATELLO FEMORAL LIGAMENT REPAIR Right   . TONSILLECTOMY AND ADENOIDECTOMY      Family History  Problem Relation Age of Onset  . Depression Mother   . Suicidality Mother   . Anxiety disorder Mother   . Bipolar disorder Mother   . Drug  abuse Mother   . Obesity Mother   . Alcohol abuse Father   . Anxiety disorder Father   . Depression Father   . Bipolar disorder Father   . Schizophrenia Father   . Liver disease Father   . Drug abuse Father   . Colon polyps Paternal Grandmother   . Esophageal cancer Neg Hx   . Pancreatic cancer Neg Hx   . Stomach cancer Neg Hx      Medications- reviewed and updated Current Outpatient Medications  Medication Sig Dispense Refill  . b complex vitamins capsule Take 1 capsule by mouth daily.    Marland Kitchen buPROPion (WELLBUTRIN XL) 300 MG 24 hr tablet Take 1 tablet (300 mg total) by mouth daily. 90 tablet 3  . metFORMIN (GLUCOPHAGE XR) 500 MG 24 hr tablet Take 3 tablets (1,500 mg total) by mouth daily with breakfast. 270 tablet 3  . Multiple Vitamins-Minerals (MULTIVITAMIN WITH MINERALS) tablet Take 1 tablet by mouth daily.    Marland Kitchen omeprazole (PRILOSEC) 20 MG capsule TAKE ONE CAPSULE BY MOUTH DAILY 90 capsule 0  . Semaglutide (RYBELSUS) 14 MG TABS Take 14 mg by mouth daily. 30 tablet 0  . Vitamin D, Ergocalciferol, (DRISDOL) 1.25 MG (50000 UT) CAPS capsule Take 1 capsule (50,000 Units total) by mouth every 7 (seven) days. 4 capsule 0   No current facility-administered medications for this visit.    Allergies-reviewed and updated Allergies  Allergen Reactions  . Penicillins Anaphylaxis, Hives, Itching and Rash    Social History   Socioeconomic History  . Marital status: Married    Spouse name: Sadie   . Number of children: 1  . Years of education: Not on file  . Highest education level: Not on file  Occupational History  . Not on file  Tobacco Use  . Smoking status: Never Smoker  . Smokeless tobacco: Never Used  Substance and Sexual Activity  . Alcohol use: Yes    Comment: occ.  . Drug use: Not on file  . Sexual activity: Not on file  Other Topics Concern  . Not on file  Social History Narrative  . Not on file   Social Determinants of Health   Financial Resource Strain:   . Difficulty of Paying Living Expenses: Not on file  Food Insecurity:   . Worried About Charity fundraiser in the Last Year: Not on file  . Ran Out of Food in the Last Year: Not on file  Transportation Needs:   . Lack of Transportation (Medical): Not on file  . Lack of Transportation (Non-Medical): Not on file  Physical Activity:   .  Days of Exercise per Week: Not on file  . Minutes of Exercise per Session: Not on file  Stress:   . Feeling of Stress : Not on file  Social Connections:   . Frequency of Communication with Friends and Family: Not on file  . Frequency of Social Gatherings with Friends and Family: Not on file  . Attends Religious Services: Not on file  . Active Member of Clubs or Organizations: Not on file  . Attends Archivist Meetings: Not on file  . Marital Status: Not on file        Objective:  Physical Exam: BP 118/74   Pulse 83   Temp (!) 97 F (36.1 C)   Ht 5\' 10"  (1.778 m)   Wt 256 lb (116.1 kg)   SpO2 98%   BMI 36.73 kg/m   Body mass index is 36.73  kg/m. Wt Readings from Last 3 Encounters:  07/24/19 256 lb (116.1 kg)  07/16/19 259 lb (117.5 kg)  06/24/19 263 lb (119.3 kg)   Gen: NAD, resting comfortably HEENT: TMs normal bilaterally. OP clear. No thyromegaly noted.  CV: RRR with no murmurs appreciated Pulm: NWOB, CTAB with no crackles, wheezes, or rhonchi GI: Normal bowel sounds present. Soft, Nontender, Nondistended. MSK: no edema, cyanosis, or clubbing noted Skin: warm, dry Neuro: CN2-12 grossly intact. Strength 5/5 in upper and lower extremities. Reflexes symmetric and intact bilaterally.  Psych: Normal affect and thought content     Rayshard Schirtzinger M. Jimmey Ralph, MD 07/24/2019 8:47 AM

## 2019-07-25 ENCOUNTER — Other Ambulatory Visit (INDEPENDENT_AMBULATORY_CARE_PROVIDER_SITE_OTHER): Payer: Self-pay

## 2019-07-25 NOTE — Progress Notes (Signed)
Please inform patient of the following:  His "bad" cholesterol is a bit low but all of his other labs are NORMAL. Would like for him to keep up the good work and we can recheck in a year or so.  Katina Degree. Jimmey Ralph, MD 07/25/2019 3:57 PM

## 2019-07-31 ENCOUNTER — Other Ambulatory Visit: Payer: Self-pay

## 2019-07-31 ENCOUNTER — Ambulatory Visit (INDEPENDENT_AMBULATORY_CARE_PROVIDER_SITE_OTHER): Payer: 59 | Admitting: Family Medicine

## 2019-07-31 ENCOUNTER — Encounter (INDEPENDENT_AMBULATORY_CARE_PROVIDER_SITE_OTHER): Payer: Self-pay | Admitting: Family Medicine

## 2019-07-31 VITALS — BP 112/68 | HR 76 | Temp 98.4°F | Ht 70.0 in | Wt 251.0 lb

## 2019-07-31 DIAGNOSIS — E039 Hypothyroidism, unspecified: Secondary | ICD-10-CM

## 2019-07-31 DIAGNOSIS — E559 Vitamin D deficiency, unspecified: Secondary | ICD-10-CM

## 2019-07-31 DIAGNOSIS — E8881 Metabolic syndrome: Secondary | ICD-10-CM | POA: Diagnosis not present

## 2019-07-31 DIAGNOSIS — E786 Lipoprotein deficiency: Secondary | ICD-10-CM | POA: Diagnosis not present

## 2019-07-31 DIAGNOSIS — Z6836 Body mass index (BMI) 36.0-36.9, adult: Secondary | ICD-10-CM | POA: Diagnosis not present

## 2019-07-31 DIAGNOSIS — F3289 Other specified depressive episodes: Secondary | ICD-10-CM | POA: Diagnosis not present

## 2019-07-31 DIAGNOSIS — E038 Other specified hypothyroidism: Secondary | ICD-10-CM

## 2019-07-31 MED ORDER — METFORMIN HCL ER 500 MG PO TB24: 1500.0000 mg | ORAL_TABLET | Freq: Every day | ORAL | 0 refills | Status: DC

## 2019-07-31 MED ORDER — VITAMIN D (ERGOCALCIFEROL) 1.25 MG (50000 UNIT) PO CAPS: 50000.0000 [IU] | ORAL_CAPSULE | ORAL | 0 refills | Status: DC

## 2019-07-31 NOTE — Progress Notes (Signed)
Chief Complaint:   OBESITY Kent Griffith is here to discuss his progress with his obesity treatment plan along with follow-up of his obesity related diagnoses. Jacarius is on keeping a food journal and adhering to recommended goals of 1200-1500 calories and 90 grams of protein and states he is following his eating plan approximately 95% of the time. Jaquane states he is exercising for 0 minutes 0 times per week.  Today's visit was #: 6 Starting weight: 270 lbs Starting date: 05/07/2019 Today's weight: 251 lbs Today's date: 07/31/2019 Total lbs lost to date: 19 lbs Total lbs lost since last in-office visit: 8 lbs  Interim History: Kent Griffith has had no polyphagia since his last visit.  He has had increased stress at work.  He enjoyed some cake and ice cream this week, but did well with portion control.  His personal goal is 220 pounds.  Subjective:   1. Low HDL (under 40) Bunyan's fasting lipid panel has significantly improved since last year.  Lab Results  Component Value Date   ALT 20 07/24/2019   AST 17 07/24/2019   ALKPHOS 70 07/24/2019   BILITOT 0.7 07/24/2019   Lab Results  Component Value Date   CHOL 115 07/24/2019   HDL 37.80 (L) 07/24/2019   LDLCALC 65 07/24/2019   LDLDIRECT 99.0 07/12/2018   TRIG 61.0 07/24/2019   CHOLHDL 3 07/24/2019   2. Vitamin D deficiency Kent Griffith's Vitamin D level was 10.7 on 05/07/2019. He is currently taking vit D. He denies nausea, vomiting or muscle weakness.  3. Subclinical hypothyroidism Current symptoms: none.   Lab Results  Component Value Date   TSH 4.03 07/24/2019   4. Insulin resistance Kent Griffith has a diagnosis of insulin resistance based on his elevated fasting insulin level >5. He continues to work on diet and exercise to decrease his risk of diabetes.  He is tolerating Rybelsus well with no polyphagia.  Lab Results  Component Value Date   INSULIN 12.3 05/07/2019   Lab Results  Component Value Date   HGBA1C 4.9 05/07/2019   5. Other  depression, with emotional eating Kent Griffith is struggling with emotional eating and using food for comfort to the extent that it is negatively impacting his health. He has been working on behavior modification techniques to help reduce his emotional eating and has been somewhat successful. He shows no sign of suicidal or homicidal ideations.  He is currently taking Wellbutrin and Rybelsus.  Assessment/Plan:   1. Low HDL (under 40) We will continue to monitor. Orders and follow up as documented in patient record.   Counseling High-density lipoprotein (HDL) cholesterol is known as the "good" cholesterol because it picks up excess cholesterol in your blood and takes it back to your liver where it's broken down and removed from your body. Higher levels of HDL cholesterol are associated with a lower risk of heart disease. Ideally, your HDL goal is > 60.   Ways to increase HDL:    Try taking an OTC Niacin supplement (B3). Start at a low dose and work your way up as it can cause flushing. If you experience flushing, try taking a baby ASA about 30 minutes prior to taking the niacin or taking the niacin after a meal.   Exercise.  2. Vitamin D deficiency Low Vitamin D level contributes to fatigue and are associated with obesity, breast, and colon cancer. He agrees to continue to take prescription Vitamin D @50 ,000 IU every week and will follow-up for routine testing of Vitamin  D, at least 2-3 times per year to avoid over-replacement.  Orders - Vitamin D, Ergocalciferol, (DRISDOL) 1.25 MG (50000 UNIT) CAPS capsule; Take 1 capsule (50,000 Units total) by mouth every 7 (seven) days.  Dispense: 4 capsule; Refill: 0  3. Subclinical hypothyroidism We will continue to monitor.  4. Insulin resistance Lamel will continue to work on weight loss, exercise, and decreasing simple carbohydrates to help decrease the risk of diabetes. Aldair agreed to follow-up with [place] as directed to closely monitor his  progress.  Orders - metFORMIN (GLUCOPHAGE XR) 500 MG 24 hr tablet; Take 3 tablets (1,500 mg total) by mouth daily with breakfast.  Dispense: 30 tablet; Refill: 0  5. Other depression, with emotional eating Behavior modification techniques were discussed today to help Kent Griffith deal with his emotional/non-hunger eating behaviors.  Orders and follow up as documented in patient record.   6. Class 2 severe obesity with serious comorbidity and body mass index (BMI) of 36.0 to 36.9 in adult, unspecified obesity type [facility]) Kent Griffith is currently in the action stage of change. As such, his goal is to continue with weight loss efforts. He has agreed to keeping a food journal and adhering to recommended goals of 1500-1800 calories and 90 grams of protein.   Exercise goals: For substantial health benefits, adults should do at least 150 minutes (2 hours and 30 minutes) a week of moderate-intensity, or 75 minutes (1 hour and 15 minutes) a week of vigorous-intensity aerobic physical activity, or an equivalent combination of moderate- and vigorous-intensity aerobic activity. Aerobic activity should be performed in episodes of at least 10 minutes, and preferably, it should be spread throughout the week. Adults should also include muscle-strengthening activities that involve all major muscle groups on 2 or more days a week.  Behavioral modification strategies: emotional eating strategies.  Kent Griffith has agreed to follow-up with our clinic in 2 weeks. He was informed of the importance of frequent follow-up visits to maximize his success with intensive lifestyle modifications for his multiple health conditions.   Objective:   Blood pressure 112/68, pulse 76, temperature 98.4 F (36.9 C), temperature source Oral, height 5\' 10"  (1.778 m), weight 251 lb (113.9 kg), SpO2 98 %. Body mass index is 36.01 kg/m.  General: Cooperative, alert, well developed, in no acute distress. HEENT: Conjunctivae and lids  unremarkable. Cardiovascular: Regular rhythm.  Lungs: Normal work of breathing. Neurologic: No focal deficits.   Lab Results  Component Value Date   CREATININE 1.08 07/24/2019   BUN 15 07/24/2019   NA 140 07/24/2019   K 4.1 07/24/2019   CL 105 07/24/2019   CO2 28 07/24/2019   Lab Results  Component Value Date   ALT 20 07/24/2019   AST 17 07/24/2019   ALKPHOS 70 07/24/2019   BILITOT 0.7 07/24/2019   Lab Results  Component Value Date   HGBA1C 4.9 05/07/2019   HGBA1C 5.0 07/12/2018   Lab Results  Component Value Date   INSULIN 12.3 05/07/2019   Lab Results  Component Value Date   TSH 4.03 07/24/2019   Lab Results  Component Value Date   CHOL 115 07/24/2019   HDL 37.80 (L) 07/24/2019   LDLCALC 65 07/24/2019   LDLDIRECT 99.0 07/12/2018   TRIG 61.0 07/24/2019   CHOLHDL 3 07/24/2019   Lab Results  Component Value Date   WBC 4.8 07/24/2019   HGB 15.2 07/24/2019   HCT 44.3 07/24/2019   MCV 78.9 07/24/2019   PLT 172.0 07/24/2019   Attestation Statements:   Reviewed  by clinician on day of visit: allergies, medications, problem list, medical history, surgical history, family history, social history, and previous encounter notes.  I, Water quality scientist, CMA, am acting as Location manager for [employer], DO.  I have reviewed the above documentation for accuracy and completeness, and I agree with the above. Briscoe Deutscher, DO

## 2019-08-12 ENCOUNTER — Other Ambulatory Visit: Payer: Self-pay | Admitting: Family Medicine

## 2019-08-14 ENCOUNTER — Encounter (INDEPENDENT_AMBULATORY_CARE_PROVIDER_SITE_OTHER): Payer: Self-pay | Admitting: Family Medicine

## 2019-08-14 ENCOUNTER — Other Ambulatory Visit: Payer: Self-pay

## 2019-08-14 ENCOUNTER — Ambulatory Visit (INDEPENDENT_AMBULATORY_CARE_PROVIDER_SITE_OTHER): Payer: 59 | Admitting: Family Medicine

## 2019-08-14 VITALS — BP 106/65 | HR 61 | Temp 98.1°F | Ht 70.0 in | Wt 247.0 lb

## 2019-08-14 DIAGNOSIS — Z8659 Personal history of other mental and behavioral disorders: Secondary | ICD-10-CM | POA: Insufficient documentation

## 2019-08-14 DIAGNOSIS — E559 Vitamin D deficiency, unspecified: Secondary | ICD-10-CM

## 2019-08-14 DIAGNOSIS — Z9189 Other specified personal risk factors, not elsewhere classified: Secondary | ICD-10-CM

## 2019-08-14 DIAGNOSIS — E88819 Insulin resistance, unspecified: Secondary | ICD-10-CM

## 2019-08-14 DIAGNOSIS — M1711 Unilateral primary osteoarthritis, right knee: Secondary | ICD-10-CM | POA: Insufficient documentation

## 2019-08-14 DIAGNOSIS — Z6835 Body mass index (BMI) 35.0-35.9, adult: Secondary | ICD-10-CM | POA: Diagnosis not present

## 2019-08-14 DIAGNOSIS — F3289 Other specified depressive episodes: Secondary | ICD-10-CM

## 2019-08-14 DIAGNOSIS — E8881 Metabolic syndrome: Secondary | ICD-10-CM

## 2019-08-14 DIAGNOSIS — G4733 Obstructive sleep apnea (adult) (pediatric): Secondary | ICD-10-CM | POA: Insufficient documentation

## 2019-08-14 DIAGNOSIS — E739 Lactose intolerance, unspecified: Secondary | ICD-10-CM | POA: Insufficient documentation

## 2019-08-14 MED ORDER — VITAMIN D (ERGOCALCIFEROL) 1.25 MG (50000 UNIT) PO CAPS: 50000.0000 [IU] | ORAL_CAPSULE | ORAL | 0 refills | Status: DC

## 2019-08-14 NOTE — Progress Notes (Signed)
Chief Complaint:   OBESITY Kent Griffith is here to discuss his progress with his obesity treatment plan along with follow-up of his obesity related diagnoses. Kent Griffith is on keeping a food journal and adhering to recommended goals of 1500-1800 calories and 90 grams of protein and states he is following his eating plan approximately 95% of the time. Kent Griffith states he is exercising for 0 minutes 0 times per week.  Today's visit was #: 7 Starting weight: 270 lbs Starting date: 05/07/2019 Today's weight: 247 lbs  Today's date: 08/14/2019 Total lbs lost to date: 23 lbs Total lbs lost since last in-office visit: 4 lbs  Interim History: Son is doing well on his diet.  He says that sometimes he may not be hitting his goal.  He has some increased stress with a young son, work, and school.  We reviewed his bioimpedance results from this visit versus his first visit.  He has decreased fat and muscle.  Subjective:   1. Vitamin D deficiency Kent Griffith's Vitamin D level was 10.7 on 05/07/2019. He is currently taking vit D. He denies nausea, vomiting or muscle weakness.  2. Insulin resistance Kent Griffith has a diagnosis of insulin resistance based on his elevated fasting insulin level >5. He continues to work on diet and exercise to decrease his risk of diabetes.  Taking Rybelsus and metformin.  Lab Results  Component Value Date   INSULIN 12.3 05/07/2019   Lab Results  Component Value Date   HGBA1C 4.9 05/07/2019   3. Other depression, with emotional eating Kent Griffith is struggling with emotional eating and using food for comfort to the extent that it is negatively impacting his health. He has been working on behavior modification techniques to help reduce his emotional eating and has been minimally successful. He shows no sign of suicidal or homicidal ideations.  He is taking Wellbutrin and Rybelsus.  4. At risk for diabetes mellitus Kent Griffith is at higher than average risk for developing diabetes due to his obesity.    Assessment/Plan:   1. Vitamin D deficiency Low Vitamin D level contributes to fatigue and are associated with obesity, breast, and colon cancer. He agrees to continue to take prescription Vitamin D @50 ,000 IU every week and will follow-up for routine testing of Vitamin D, at least 2-3 times per year to avoid over-replacement.  Orders - Vitamin D, Ergocalciferol, (DRISDOL) 1.25 MG (50000 UNIT) CAPS capsule; Take 1 capsule (50,000 Units total) by mouth every 7 (seven) days.  Dispense: 4 capsule; Refill: 0  2. Insulin resistance Kent Griffith will continue to work on weight loss, exercise, and decreasing simple carbohydrates to help decrease the risk of diabetes. Kent Griffith agreed to follow-up with Kent Griffith as directed to closely monitor his progress.  He will continue Rybelsus and metformin.  Will change Rybelsus to Saxenda if his insurance does not continue to cover the Rybelsus.  3. Other depression, with emotional eating Behavior modification techniques were discussed today to help Kent Griffith deal with his emotional/non-hunger eating behaviors.  Orders and follow up as documented in patient record.   4. At risk for diabetes mellitus Kent Griffith was given approximately 15 minutes of diabetes education and counseling today. We discussed intensive lifestyle modifications today with an emphasis on weight loss as well as increasing exercise and decreasing simple carbohydrates in his diet. We also reviewed medication options with an emphasis on risk versus benefit of those discussed.   Repetitive spaced learning was employed today to elicit superior memory formation and behavioral change.  5. Class  2 severe obesity with serious comorbidity and body mass index (BMI) of 35.0 to 35.9 in adult, unspecified obesity type [facility]) Kent Griffith is currently in the action stage of change. As such, his goal is to continue with weight loss efforts. He has agreed to keeping a food journal and adhering to recommended goals of 1200-1500 calories and 90  grams of protein.   Exercise goals: For substantial health benefits, adults should do at least 150 minutes (2 hours and 30 minutes) a week of moderate-intensity, or 75 minutes (1 hour and 15 minutes) a week of vigorous-intensity aerobic physical activity, or an equivalent combination of moderate- and vigorous-intensity aerobic activity. Aerobic activity should be performed in episodes of at least 10 minutes, and preferably, it should be spread throughout the week. Adults should also include muscle-strengthening activities that involve all major muscle groups on 2 or more days a week.  Behavioral modification strategies: increasing lean protein intake and no skipping meals.  Kent Griffith has agreed to follow-up with our clinic in 2 weeks. He was informed of the importance of frequent follow-up visits to maximize his success with intensive lifestyle modifications for his multiple health conditions.   Objective:   Blood pressure 106/65, pulse 61, temperature 98.1 F (36.7 C), temperature source Oral, height 5\' 10"  (1.778 m), weight 247 lb (112 kg), SpO2 98 %. Body mass index is 35.44 kg/m.  General: Cooperative, alert, well developed, in no acute distress. HEENT: Conjunctivae and lids unremarkable. Cardiovascular: Regular rhythm.  Lungs: Normal work of breathing. Neurologic: No focal deficits.   Lab Results  Component Value Date   CREATININE 1.08 07/24/2019   BUN 15 07/24/2019   NA 140 07/24/2019   K 4.1 07/24/2019   CL 105 07/24/2019   CO2 28 07/24/2019   Lab Results  Component Value Date   ALT 20 07/24/2019   AST 17 07/24/2019   ALKPHOS 70 07/24/2019   BILITOT 0.7 07/24/2019   Lab Results  Component Value Date   HGBA1C 4.9 05/07/2019   HGBA1C 5.0 07/12/2018   Lab Results  Component Value Date   INSULIN 12.3 05/07/2019   Lab Results  Component Value Date   TSH 4.03 07/24/2019   Lab Results  Component Value Date   CHOL 115 07/24/2019   HDL 37.80 (L) 07/24/2019   LDLCALC 65  07/24/2019   LDLDIRECT 99.0 07/12/2018   TRIG 61.0 07/24/2019   CHOLHDL 3 07/24/2019   Lab Results  Component Value Date   WBC 4.8 07/24/2019   HGB 15.2 07/24/2019   HCT 44.3 07/24/2019   MCV 78.9 07/24/2019   PLT 172.0 07/24/2019   Attestation Statements:   Reviewed by clinician on day of visit: allergies, medications, problem list, medical history, surgical history, family history, social history, and previous encounter notes.  I, Water quality scientist, CMA, am acting as Location manager for [employer], DO.  I have reviewed the above documentation for accuracy and completeness, and I agree with the above. Briscoe Deutscher, DO

## 2019-09-04 ENCOUNTER — Ambulatory Visit (INDEPENDENT_AMBULATORY_CARE_PROVIDER_SITE_OTHER): Payer: 59 | Admitting: Family Medicine

## 2019-09-04 ENCOUNTER — Encounter (INDEPENDENT_AMBULATORY_CARE_PROVIDER_SITE_OTHER): Payer: Self-pay | Admitting: Family Medicine

## 2019-09-04 ENCOUNTER — Other Ambulatory Visit: Payer: Self-pay

## 2019-09-04 VITALS — BP 113/66 | HR 71 | Temp 98.0°F | Ht 70.0 in | Wt 247.0 lb

## 2019-09-04 DIAGNOSIS — E8881 Metabolic syndrome: Secondary | ICD-10-CM | POA: Diagnosis not present

## 2019-09-04 DIAGNOSIS — E559 Vitamin D deficiency, unspecified: Secondary | ICD-10-CM

## 2019-09-04 DIAGNOSIS — Z9189 Other specified personal risk factors, not elsewhere classified: Secondary | ICD-10-CM | POA: Diagnosis not present

## 2019-09-04 DIAGNOSIS — E039 Hypothyroidism, unspecified: Secondary | ICD-10-CM | POA: Diagnosis not present

## 2019-09-04 DIAGNOSIS — F3289 Other specified depressive episodes: Secondary | ICD-10-CM | POA: Diagnosis not present

## 2019-09-04 DIAGNOSIS — Z6835 Body mass index (BMI) 35.0-35.9, adult: Secondary | ICD-10-CM | POA: Diagnosis not present

## 2019-09-04 DIAGNOSIS — E038 Other specified hypothyroidism: Secondary | ICD-10-CM

## 2019-09-04 DIAGNOSIS — E88819 Insulin resistance, unspecified: Secondary | ICD-10-CM

## 2019-09-04 MED ORDER — VITAMIN D (ERGOCALCIFEROL) 1.25 MG (50000 UNIT) PO CAPS: 50000.0000 [IU] | ORAL_CAPSULE | ORAL | 0 refills | Status: DC

## 2019-09-04 MED ORDER — NALTREXONE HCL 50 MG PO TABS: 25.0000 mg | ORAL_TABLET | Freq: Every day | ORAL | 0 refills | Status: DC

## 2019-09-04 NOTE — Progress Notes (Signed)
Chief Complaint:   OBESITY Kent Griffith is here to discuss his progress with his obesity treatment plan along with follow-up of his obesity related diagnoses. Kent Griffith is on keeping a food journal and adhering to recommended goals of 1200-1500 calories and 90 grams of protein and states he is following his eating plan approximately 60% of the time. [name] states he is exercising for 0 minutes 0 times per week.  Today's visit was #: 8 Starting weight: 270 lbs Starting date: 05/07/2019 Today's weight: 247 lbs Today's date: 09/04/2019 Total lbs lost to date: 23 lbs Total lbs lost since last in-office visit: 0  Interim History: [name] reports running out of Rybelsus over a week ago.  He read about Contrave and would like to try it.  He has had increased cravings and emotional eating.  Subjective:   1. Insulin resistance Kent Griffith has a diagnosis of insulin resistance based on his elevated fasting insulin level >5. He continues to work on diet and exercise to decrease his risk of diabetes.  He is taking metformin 1500 mg daily.  Lab Results  Component Value Date   INSULIN 12.3 05/07/2019   Lab Results  Component Value Date   HGBA1C 4.9 05/07/2019   2. Vitamin D deficiency Kent Griffith's Vitamin D level was 31.7 on 05/07/2019. He is currently taking vit D. He denies nausea, vomiting or muscle weakness.  3. Subclinical hypothyroidism Kent Griffith's recent thyroid labs have been reviewed.   Lab Results  Component Value Date   TSH 4.03 07/24/2019   4. Other depression, with emotional eating Kent Griffith is struggling with emotional eating and using food for comfort to the extent that it is negatively impacting his health. He has been working on behavior modification techniques to help reduce his emotional eating and has been somewhat successful. He shows no sign of suicidal or homicidal ideations.  He is taking Wellbutrin 300 mg daily.  5. At risk for nausea Kent Griffith is at risk for nausea due to new  medication.  Assessment/Plan:   1. Insulin resistance [name] will continue to work on weight loss, exercise, and decreasing simple carbohydrates to help decrease the risk of diabetes. [name] agreed to follow-up with [place] as directed to closely monitor his progress.  2. Vitamin D deficiency Low Vitamin D level contributes to fatigue and are associated with obesity, breast, and colon cancer. He agrees to continue to take prescription Vitamin D @50 ,000 IU every week and will follow-up for routine testing of Vitamin D, at least 2-3 times per year to avoid over-replacement.  Orders - Vitamin D, Ergocalciferol, (DRISDOL) 1.25 MG (50000 UNIT) CAPS capsule; Take 1 capsule (50,000 Units total) by mouth every 7 (seven) days.  Dispense: 4 capsule; Refill: 0  3. Subclinical hypothyroidism Patient with long-standing hypothyroidism, on levothyroxine therapy. He appears euthyroid. Orders and follow up as documented in patient record.  Counseling . Good thyroid control is important for overall health. Supratherapeutic thyroid levels are dangerous and will not improve weight loss results.  4. Other depression, with emotional eating Behavior modification techniques were discussed today to help Kent Griffith deal with his emotional/non-hunger eating behaviors.  Orders and follow up as documented in patient record.   Orders - naltrexone (DEPADE) 50 MG tablet; Take 0.5 tablets (25 mg total) by mouth daily.  Dispense: 30 tablet; Refill: 0  5. At risk for nausea [name] was given approximately 15 minutes of nausea prevention counseling today. [name] is at risk for nausea due to his new or current medication. He  was encouraged to titrate his medication slowly, make sure to stay hydrated, eat smaller portions throughout the day, and avoid high fat meals.   6. Class 2 severe obesity with serious comorbidity and body mass index (BMI) of 35.0 to 35.9 in adult, unspecified obesity type [facility]) Kent Griffith is currently in the action stage  of change. As such, his goal is to continue with weight loss efforts. He has agreed to keeping a food journal and adhering to recommended goals of 1500 calories and 90 grams of protein.   Exercise goals: All adults should avoid inactivity. Some physical activity is better than none, and adults who participate in any amount of physical activity gain some health benefits.  Behavioral modification strategies: increasing lean protein intake and emotional eating strategies.  [name] has agreed to follow-up with our clinic in 2 weeks. He was informed of the importance of frequent follow-up visits to maximize his success with intensive lifestyle modifications for his multiple health conditions.   Objective:   Blood pressure 113/66, pulse 71, temperature 98 F (36.7 C), temperature source Oral, height 5\' 10"  (1.778 m), weight 247 lb (112 kg), SpO2 99 %. Body mass index is 35.44 kg/m.  General: Cooperative, alert, well developed, in no acute distress. HEENT: Conjunctivae and lids unremarkable. Cardiovascular: Regular rhythm.  Lungs: Normal work of breathing. Neurologic: No focal deficits.   Lab Results  Component Value Date   CREATININE 1.08 07/24/2019   BUN 15 07/24/2019   NA 140 07/24/2019   K 4.1 07/24/2019   CL 105 07/24/2019   CO2 28 07/24/2019   Lab Results  Component Value Date   ALT 20 07/24/2019   AST 17 07/24/2019   ALKPHOS 70 07/24/2019   BILITOT 0.7 07/24/2019   Lab Results  Component Value Date   HGBA1C 4.9 05/07/2019   HGBA1C 5.0 07/12/2018   Lab Results  Component Value Date   INSULIN 12.3 05/07/2019   Lab Results  Component Value Date   TSH 4.03 07/24/2019   Lab Results  Component Value Date   CHOL 115 07/24/2019   HDL 37.80 (L) 07/24/2019   LDLCALC 65 07/24/2019   LDLDIRECT 99.0 07/12/2018   TRIG 61.0 07/24/2019   CHOLHDL 3 07/24/2019   Lab Results  Component Value Date   WBC 4.8 07/24/2019   HGB 15.2 07/24/2019   HCT 44.3 07/24/2019   MCV 78.9  07/24/2019   PLT 172.0 07/24/2019   Attestation Statements:   Reviewed by clinician on day of visit: allergies, medications, problem list, medical history, surgical history, family history, social history, and previous encounter notes.  I, 07/26/2019, CMA, am acting as Insurance claims handler for [employer], DO.  I have reviewed the above documentation for accuracy and completeness, and I agree with the above. [employer], DO

## 2019-09-17 ENCOUNTER — Encounter (INDEPENDENT_AMBULATORY_CARE_PROVIDER_SITE_OTHER): Payer: Self-pay | Admitting: Family Medicine

## 2019-09-17 NOTE — Telephone Encounter (Signed)
Please advise. Thanks.  

## 2019-09-25 ENCOUNTER — Other Ambulatory Visit: Payer: Self-pay

## 2019-09-25 ENCOUNTER — Ambulatory Visit (INDEPENDENT_AMBULATORY_CARE_PROVIDER_SITE_OTHER): Payer: 59 | Admitting: Family Medicine

## 2019-09-25 ENCOUNTER — Encounter (INDEPENDENT_AMBULATORY_CARE_PROVIDER_SITE_OTHER): Payer: Self-pay | Admitting: Family Medicine

## 2019-09-25 VITALS — BP 105/52 | HR 75 | Temp 98.4°F | Ht 70.0 in | Wt 244.0 lb

## 2019-09-25 DIAGNOSIS — E8881 Metabolic syndrome: Secondary | ICD-10-CM | POA: Diagnosis not present

## 2019-09-25 DIAGNOSIS — K219 Gastro-esophageal reflux disease without esophagitis: Secondary | ICD-10-CM | POA: Diagnosis not present

## 2019-09-25 DIAGNOSIS — Z9189 Other specified personal risk factors, not elsewhere classified: Secondary | ICD-10-CM

## 2019-09-25 DIAGNOSIS — Z6835 Body mass index (BMI) 35.0-35.9, adult: Secondary | ICD-10-CM

## 2019-09-25 DIAGNOSIS — F3289 Other specified depressive episodes: Secondary | ICD-10-CM

## 2019-09-25 DIAGNOSIS — E559 Vitamin D deficiency, unspecified: Secondary | ICD-10-CM

## 2019-09-25 DIAGNOSIS — E88819 Insulin resistance, unspecified: Secondary | ICD-10-CM

## 2019-09-25 MED ORDER — NALTREXONE HCL 50 MG PO TABS: 50.0000 mg | ORAL_TABLET | Freq: Every day | ORAL | 0 refills | Status: DC

## 2019-09-25 NOTE — Progress Notes (Signed)
Chief Complaint:   OBESITY Kent Griffith is here to discuss his progress with his obesity treatment plan along with follow-up of his obesity related diagnoses. Kent Griffith is on keeping a food journal and adhering to recommended goals of 1200-1500 calories and 90 grams of protein and states he is following his eating plan approximately 90% of the time. Kent Griffith states he is walking for 60 minutes 2 times per week.  Today's visit was #: 9 Starting weight: 270 lbs Starting date: 05/07/2019 Today's weight: 244 lbs Today's date: 09/25/2019 Total lbs lost to date: 26 lbs Total lbs lost since last in-office visit: 3 lbs  Interim History: Major's average calories over the last several days have been 2100, 1500, 1000, and 1474.  His initial indirect calorimetry on 05/07/2019 was 2766.  Unfortunately, review of initial bioimpedance today shows significant muscle loss.   Subjective:   1. Insulin resistance Kent Griffith has a diagnosis of insulin resistance based on his elevated fasting insulin level >5. He continues to work on diet and exercise to decrease his risk of diabetes.  He is taking metformin 1500 mg daily.  Lab Results  Component Value Date   INSULIN 12.3 05/07/2019   Lab Results  Component Value Date   HGBA1C 4.9 05/07/2019   2. Vitamin D deficiency Duncan's Vitamin D level was 31.7 on 05/07/2019. He is currently taking vit D. He denies nausea, vomiting or muscle weakness.  3. Gastroesophageal reflux disease without esophagitis He is taking omeprazole.  4. Other depression, with emotional eating Kent Griffith is struggling with emotional eating and using food for comfort to the extent that it is negatively impacting his health. He has been working on behavior modification techniques to help reduce his emotional eating and has been moderately successful. He is taking Wellbutrin 300 mg daily and naltrexone 50 mg daily.  Assessment/Plan:   1. Insulin resistance Mingo will continue to work on weight loss,  exercise, and decreasing simple carbohydrates to help decrease the risk of diabetes. [place]ton agreed to follow-up with [place] as directed to closely monitor his progress.  2. Vitamin D deficiency Low Vitamin D level contributes to fatigue and are associated with obesity, breast, and colon cancer. He agrees to continue to take prescription Vitamin D @50 ,000 IU every week and will follow-up for routine testing of Vitamin D, at least 2-3 times per year to avoid over-replacement.  3. Gastroesophageal reflux disease without esophagitis Intensive lifestyle modifications are the first line treatment for this issue. We discussed several lifestyle modifications today and he will continue to work on diet, exercise and weight loss efforts. Orders and follow up as documented in patient record.   Counseling . If a person has gastroesophageal reflux disease (GERD), food and stomach acid move back up into the esophagus and cause symptoms or problems such as damage to the esophagus. . Anti-reflux measures include: raising the head of the bed, avoiding tight clothing or belts, avoiding eating late at night, not lying down shortly after mealtime, and achieving weight loss. . Avoid ASA, NSAID's, caffeine, alcohol, and tobacco.  . OTC Pepcid and/or Tums are often very helpful for as needed use.  However, for persisting chronic or daily symptoms, stronger medications like Omeprazole may be needed. . You may need to avoid foods and drinks such as: ? Coffee and tea (with or without caffeine). ? Drinks that contain alcohol. ? Energy drinks and sports drinks. ? Bubbly (carbonated) drinks or sodas. ? Chocolate and cocoa. ? Peppermint and mint flavorings. ?  Garlic and onions. ? Horseradish. ? Spicy and acidic foods. These include peppers, chili powder, curry powder, vinegar, hot sauces, and BBQ sauce. ? Citrus fruit juices and citrus fruits, such as oranges, lemons, and limes. ? Tomato-based foods. These include red sauce,  chili, salsa, and pizza with red sauce. ? Fried and fatty foods. These include donuts, french fries, potato chips, and high-fat dressings. ? High-fat meats. These include hot dogs, rib eye steak, sausage, ham, and bacon.  4. Other depression, with emotional eating Behavior modification techniques were discussed today to help Kent Griffith deal with his emotional/non-hunger eating behaviors.  Orders and follow up as documented in patient record.   Orders - naltrexone (DEPADE) 50 MG tablet; Take 0.5 tablets (25 mg total) by mouth daily.  Dispense: 30 tablet; Refill: 0  5. At risk for deficient intake of food Kent Griffith was given approximately 15 minutes of deficit intake of food prevention counseling today. Kent Griffith is at risk for eating too few calories based on current food recall. He was encouraged to focus on meeting caloric and protein goals according to his recommended meal plan.   6. Class 2 severe obesity with serious comorbidity and body mass index (BMI) of 35.0 to 35.9 in adult, unspecified obesity type [facility]) Kent Griffith is currently in the action stage of change. As such, his goal is to continue with weight loss efforts. He has agreed to keeping a food journal and adhering to recommended goals of 1500 calories and 95+ grams of protein.   Exercise goals: HIIT, strength training  Behavioral modification strategies: increasing lean protein intake and meal planning and cooking strategies.  Ison has agreed to follow-up with our clinic in 2 weeks. He was informed of the importance of frequent follow-up visits to maximize his success with intensive lifestyle modifications for his multiple health conditions.   Objective:   Blood pressure (!) 105/52, pulse 75, temperature 98.4 F (36.9 C), temperature source Oral, height 5\' 10"  (1.778 m), weight 244 lb (110.7 kg), SpO2 98 %. Body mass index is 35.01 kg/m.  General: Cooperative, alert, well developed, in no acute distress. HEENT: Conjunctivae and lids  unremarkable. Cardiovascular: Regular rhythm.  Lungs: Normal work of breathing. Neurologic: No focal deficits.   Lab Results  Component Value Date   CREATININE 1.08 07/24/2019   BUN 15 07/24/2019   NA 140 07/24/2019   K 4.1 07/24/2019   CL 105 07/24/2019   CO2 28 07/24/2019   Lab Results  Component Value Date   ALT 20 07/24/2019   AST 17 07/24/2019   ALKPHOS 70 07/24/2019   BILITOT 0.7 07/24/2019   Lab Results  Component Value Date   HGBA1C 4.9 05/07/2019   HGBA1C 5.0 07/12/2018   Lab Results  Component Value Date   INSULIN 12.3 05/07/2019   Lab Results  Component Value Date   TSH 4.03 07/24/2019   Lab Results  Component Value Date   CHOL 115 07/24/2019   HDL 37.80 (L) 07/24/2019   LDLCALC 65 07/24/2019   LDLDIRECT 99.0 07/12/2018   TRIG 61.0 07/24/2019   CHOLHDL 3 07/24/2019   Lab Results  Component Value Date   WBC 4.8 07/24/2019   HGB 15.2 07/24/2019   HCT 44.3 07/24/2019   MCV 78.9 07/24/2019   PLT 172.0 07/24/2019   Attestation Statements:   Reviewed by clinician on day of visit: allergies, medications, problem list, medical history, surgical history, family history, social history, and previous encounter notes.  I, 07/26/2019, CMA, am acting as Insurance claims handler for  Briscoe Deutscher, DO.  I have reviewed the above documentation for accuracy and completeness, and I agree with the above. Briscoe Deutscher, DO

## 2019-09-26 NOTE — Telephone Encounter (Signed)
Please advise. Thank you

## 2019-10-03 ENCOUNTER — Telehealth: Payer: Self-pay | Admitting: Dietician

## 2019-10-03 NOTE — Telephone Encounter (Signed)
Brief Nutrition Note Kent Griffith stopped by my office reporting continued weight loss but concerns of a 22 lb muscle loss. He requests more ideas to increase his protein intake. Discussed that exercise is also important.  He states that he is getting back into exercise and is considering crossfit but time is a barrier.  Provided the following handouts: Phase III Meal ideas List of Proteins with grams Vegetarian protein options  Discussed that protein can come from many sources including grains and vegetables.   He is drinking a fairlife protein shake which has 30 grams of protein daily usually but has run out of this. His goal is 125 grams protein daily.  Follow up as needed.  Oran Rein, RD, LDN, CDE

## 2019-10-17 ENCOUNTER — Encounter (INDEPENDENT_AMBULATORY_CARE_PROVIDER_SITE_OTHER): Payer: Self-pay | Admitting: Family Medicine

## 2019-10-17 ENCOUNTER — Ambulatory Visit (INDEPENDENT_AMBULATORY_CARE_PROVIDER_SITE_OTHER): Payer: 59 | Admitting: Family Medicine

## 2019-10-17 ENCOUNTER — Other Ambulatory Visit: Payer: Self-pay

## 2019-10-17 VITALS — BP 116/69 | HR 75 | Temp 98.2°F | Ht 70.0 in | Wt 246.0 lb

## 2019-10-17 DIAGNOSIS — Z6835 Body mass index (BMI) 35.0-35.9, adult: Secondary | ICD-10-CM | POA: Diagnosis not present

## 2019-10-17 DIAGNOSIS — E8881 Metabolic syndrome: Secondary | ICD-10-CM

## 2019-10-17 DIAGNOSIS — R632 Polyphagia: Secondary | ICD-10-CM | POA: Diagnosis not present

## 2019-10-17 DIAGNOSIS — Z9189 Other specified personal risk factors, not elsewhere classified: Secondary | ICD-10-CM | POA: Diagnosis not present

## 2019-10-17 DIAGNOSIS — R0600 Dyspnea, unspecified: Secondary | ICD-10-CM | POA: Diagnosis not present

## 2019-10-17 DIAGNOSIS — R0609 Other forms of dyspnea: Secondary | ICD-10-CM

## 2019-10-17 MED ORDER — PHENTERMINE HCL 37.5 MG PO TABS: 18.7500 mg | ORAL_TABLET | Freq: Every day | ORAL | 0 refills | Status: DC

## 2019-10-17 NOTE — Progress Notes (Signed)
Chief Complaint:   OBESITY Rena is here to discuss his progress with his obesity treatment plan along with follow-up of his obesity related diagnoses. Inioluwa is on keeping a food journal and adhering to recommended goals of 1500 calories and 125 grams of protein daily and states he is following his eating plan approximately 40% of the time. Tyshawn states he is doing CrossFit for 60 minutes 1 time per week.  Today's visit was #: 10 Starting weight: 270 lbs Starting date: 05/07/2019 Today's weight: 246 lbs Today's date: 10/17/2019 Total lbs lost to date: 24 lbs Total lbs lost since last in-office visit: 0  Interim History: Aden says he has been using [employer] meal prep for breakfast.  He likes their protein donuts.  Subjective:   1. Polyphagia Deondrick endorses excessive hunger.   2. Insulin resistance Davinder has a diagnosis of insulin resistance based on his elevated fasting insulin level >5. He continues to work on diet and exercise to decrease his risk of diabetes.  Ramel is taking metformin 1500 mg every morning.  Lab Results  Component Value Date   INSULIN 12.3 05/07/2019   Lab Results  Component Value Date   HGBA1C 4.9 05/07/2019   3. At risk for deficient intake of food The patient is at a higher than average risk of deficient intake of food.  Assessment/Plan:   1. Polyphagia Intensive lifestyle modifications are the first line treatment for this issue. We discussed several lifestyle modifications today and he will continue to work on diet, exercise and weight loss efforts. Orders and follow up as documented in patient record.  Will start Theus on phentermine 1/2 tablet daily to help combat polyphagia.  Counseling Hyperphagia, also called polyphagia, refers to excessive feelings of hunger, which are not relieved by eating. This is more likely to be an issues for people that have diabetes, prediabetes, or insulin resistance.   Orders - phentermine (ADIPEX-P) 37.5 MG tablet;  Take 0.5 tablets (18.75 mg total) by mouth daily before breakfast.  Dispense: 30 tablet; Refill: 0  2. Insulin resistance Henson will continue to work on weight loss, exercise, and decreasing simple carbohydrates to help decrease the risk of diabetes. Anish agreed to follow-up with [place] as directed to closely monitor his progress.    3. At risk for deficient intake of food Rastus was given approximately 15 minutes of deficit intake of food prevention counseling today. Maddox is at risk for eating too few calories based on current food recall. He was encouraged to focus on meeting caloric and protein goals according to his recommended meal plan.   4. Class 2 severe obesity with serious comorbidity and body mass index (BMI) of 35.0 to 35.9 in adult, unspecified obesity type [facility]) Chrishaun is currently in the action stage of change. As such, his goal is to continue with weight loss efforts. He has agreed to keeping a food journal and adhering to recommended goals of 1500 calories and 125 grams of protein.   Exercise goals: As is.  Behavioral modification strategies: increasing lean protein intake and increasing water intake.  Valmore has agreed to follow-up with our clinic in 2 weeks. He was informed of the importance of frequent follow-up visits to maximize his success with intensive lifestyle modifications for his multiple health conditions.   Objective:   Blood pressure 116/69, pulse 75, temperature 98.2 F (36.8 C), temperature source Oral, height 5\' 10"  (1.778 m), weight 246 lb (111.6 kg), SpO2 98 %. Body mass index is  35.3 kg/m.  General: Cooperative, alert, well developed, in no acute distress. HEENT: Conjunctivae and lids unremarkable. Cardiovascular: Regular rhythm.  Lungs: Normal work of breathing. Neurologic: No focal deficits.   Lab Results  Component Value Date   CREATININE 1.08 07/24/2019   BUN 15 07/24/2019   NA 140 07/24/2019   K 4.1 07/24/2019   CL 105 07/24/2019   CO2 28  07/24/2019   Lab Results  Component Value Date   ALT 20 07/24/2019   AST 17 07/24/2019   ALKPHOS 70 07/24/2019   BILITOT 0.7 07/24/2019   Lab Results  Component Value Date   HGBA1C 4.9 05/07/2019   HGBA1C 5.0 07/12/2018   Lab Results  Component Value Date   INSULIN 12.3 05/07/2019   Lab Results  Component Value Date   TSH 4.03 07/24/2019   Lab Results  Component Value Date   CHOL 115 07/24/2019   HDL 37.80 (L) 07/24/2019   LDLCALC 65 07/24/2019   LDLDIRECT 99.0 07/12/2018   TRIG 61.0 07/24/2019   CHOLHDL 3 07/24/2019   Lab Results  Component Value Date   WBC 4.8 07/24/2019   HGB 15.2 07/24/2019   HCT 44.3 07/24/2019   MCV 78.9 07/24/2019   PLT 172.0 07/24/2019   Attestation Statements:   Reviewed by clinician on day of visit: allergies, medications, problem list, medical history, surgical history, family history, social history, and previous encounter notes.  I, Water quality scientist, CMA, am acting as Location manager for [employer], DO.  I have reviewed the above documentation for accuracy and completeness, and I agree with the above. Briscoe Deutscher, DO

## 2019-10-22 ENCOUNTER — Other Ambulatory Visit (INDEPENDENT_AMBULATORY_CARE_PROVIDER_SITE_OTHER): Payer: Self-pay | Admitting: Family Medicine

## 2019-10-22 DIAGNOSIS — E559 Vitamin D deficiency, unspecified: Secondary | ICD-10-CM

## 2019-10-22 MED ORDER — VITAMIN D (ERGOCALCIFEROL) 1.25 MG (50000 UNIT) PO CAPS: 50000.0000 [IU] | ORAL_CAPSULE | ORAL | 0 refills | Status: DC

## 2019-10-26 ENCOUNTER — Other Ambulatory Visit (INDEPENDENT_AMBULATORY_CARE_PROVIDER_SITE_OTHER): Payer: Self-pay | Admitting: Family Medicine

## 2019-10-26 ENCOUNTER — Other Ambulatory Visit: Payer: Self-pay | Admitting: Family Medicine

## 2019-10-26 DIAGNOSIS — E8881 Metabolic syndrome: Secondary | ICD-10-CM

## 2019-10-28 MED ORDER — OMEPRAZOLE 20 MG PO CPDR: 20.0000 mg | DELAYED_RELEASE_CAPSULE | Freq: Every day | ORAL | 0 refills | Status: DC

## 2019-10-29 MED ORDER — METFORMIN HCL ER 500 MG PO TB24: 1500.0000 mg | ORAL_TABLET | Freq: Every day | ORAL | 0 refills | Status: DC

## 2019-11-04 ENCOUNTER — Other Ambulatory Visit (INDEPENDENT_AMBULATORY_CARE_PROVIDER_SITE_OTHER): Payer: Self-pay | Admitting: Family Medicine

## 2019-11-04 DIAGNOSIS — F3289 Other specified depressive episodes: Secondary | ICD-10-CM

## 2019-11-04 MED ORDER — NALTREXONE HCL 50 MG PO TABS: 50.0000 mg | ORAL_TABLET | Freq: Every day | ORAL | 0 refills | Status: DC

## 2019-11-08 ENCOUNTER — Ambulatory Visit: Payer: 59 | Admitting: Dietician

## 2019-11-12 ENCOUNTER — Ambulatory Visit (INDEPENDENT_AMBULATORY_CARE_PROVIDER_SITE_OTHER): Payer: 59 | Admitting: Family Medicine

## 2019-11-12 ENCOUNTER — Encounter (INDEPENDENT_AMBULATORY_CARE_PROVIDER_SITE_OTHER): Payer: Self-pay | Admitting: Family Medicine

## 2019-11-12 ENCOUNTER — Other Ambulatory Visit: Payer: Self-pay

## 2019-11-12 VITALS — BP 99/55 | HR 73 | Temp 98.2°F | Ht 70.0 in | Wt 238.0 lb

## 2019-11-12 DIAGNOSIS — Z6834 Body mass index (BMI) 34.0-34.9, adult: Secondary | ICD-10-CM

## 2019-11-12 DIAGNOSIS — F3289 Other specified depressive episodes: Secondary | ICD-10-CM | POA: Diagnosis not present

## 2019-11-12 DIAGNOSIS — E8881 Metabolic syndrome: Secondary | ICD-10-CM

## 2019-11-12 DIAGNOSIS — Z9189 Other specified personal risk factors, not elsewhere classified: Secondary | ICD-10-CM | POA: Diagnosis not present

## 2019-11-12 DIAGNOSIS — E559 Vitamin D deficiency, unspecified: Secondary | ICD-10-CM | POA: Diagnosis not present

## 2019-11-12 DIAGNOSIS — E669 Obesity, unspecified: Secondary | ICD-10-CM

## 2019-11-12 MED ORDER — VITAMIN D (ERGOCALCIFEROL) 1.25 MG (50000 UNIT) PO CAPS: 50000.0000 [IU] | ORAL_CAPSULE | ORAL | 0 refills | Status: DC

## 2019-11-12 NOTE — Progress Notes (Signed)
Chief Complaint:   OBESITY Kent Griffith is here to discuss his progress with his obesity treatment plan along with follow-up of his obesity related diagnoses. Kent Griffith is on keeping a food journal and adhering to recommended goals of 1500 calories and 125 grams of protein and states he is following his eating plan approximately 90% of the time. Kent Griffith states he is exercising for 0 minutes 0 times per week.  Today's visit was #: 11 Starting weight: 270 lbs Starting date: 05/07/2019 Today's weight: 238 lbs Today's date: 11/12/2019 Total lbs lost to date: 32 lbs Total lbs lost since last in-office visit: 8 lbs  Interim History: Kent Griffith reports that the phentermine is helping with the polyphagia but is causing some irritability.  Subjective:   1. Insulin resistance Kent Griffith has a diagnosis of insulin resistance based on his elevated fasting insulin level >5. He continues to work on diet and exercise to decrease his risk of diabetes.  Lab Results  Component Value Date   INSULIN 12.3 05/07/2019   Lab Results  Component Value Date   HGBA1C 4.9 05/07/2019   2. Vitamin D deficiency Kent Griffith's Vitamin D level was 31.7 on 05/07/2019. He is currently taking prescription vitamin D 50,000 IU each week. He denies nausea, vomiting or muscle weakness.  3. Other depression, with emotional eating Kent Griffith is struggling with emotional eating and using food for comfort to the extent that it is negatively impacting his health. He has been working on behavior modification techniques to help reduce his emotional eating and has been successful. He shows no sign of suicidal or homicidal ideations.  4. At risk for constipation Kent Griffith is at increased risk for constipation due to inadequate water intake, changes in diet, and/or use of medications such as GLP1 agonists. Kent Griffith denies hard, infrequent stools currently.   Assessment/Plan:   1. Insulin resistance Kent Griffith will continue to work on weight loss, exercise, and decreasing  simple carbohydrates to help decrease the risk of diabetes. Kent Griffith agreed to follow-up with [place] as directed to closely monitor his progress.   2. Vitamin D deficiency Low Vitamin D level contributes to fatigue and are associated with obesity, breast, and colon cancer. He agrees to continue to take prescription Vitamin D @50 ,000 IU every week and will follow-up for routine testing of Vitamin D, at least 2-3 times per year to avoid over-replacement.  Orders - Vitamin D, Ergocalciferol, (DRISDOL) 1.25 MG (50000 UNIT) CAPS capsule; Take 1 capsule (50,000 Units total) by mouth every 7 (seven) days.  Dispense: 4 capsule; Refill: 0  3. Other depression, with emotional eating Behavior modification techniques were discussed today to help Kent Griffith deal with his emotional/non-hunger eating behaviors.  Orders and follow up as documented in patient record.   4. At risk for constipation Kent Griffith was given approximately 15 minutes of counseling today regarding prevention of constipation. He was encouraged to increase water and fiber intake.   5. Class 1 obesity with serious comorbidity and body mass index (BMI) of 34.0 to 34.9 in adult, unspecified obesity type Kent Griffith is currently in the action stage of change. As such, his goal is to continue with weight loss efforts. He has agreed to keeping a food journal and adhering to recommended goals of 1500 calories and 125 grams of protein.   Exercise goals: For substantial health benefits, adults should do at least 150 minutes (2 hours and 30 minutes) a week of moderate-intensity, or 75 minutes (1 hour and 15 minutes) a week of vigorous-intensity aerobic physical activity,  or an equivalent combination of moderate- and vigorous-intensity aerobic activity. Aerobic activity should be performed in episodes of at least 10 minutes, and preferably, it should be spread throughout the week.  Behavioral modification strategies: increasing water intake.  Kent Griffith has agreed to follow-up with  our clinic in 2 weeks. He was informed of the importance of frequent follow-up visits to maximize his success with intensive lifestyle modifications for his multiple health conditions.   Objective:   Blood pressure (!) 99/55, pulse 73, temperature 98.2 F (36.8 C), temperature source Oral, height 5\' 10"  (1.778 m), weight 238 lb (108 kg), SpO2 100 %. Body mass index is 34.15 kg/m.  General: Cooperative, alert, well developed, in no acute distress. HEENT: Conjunctivae and lids unremarkable. Cardiovascular: Regular rhythm.  Lungs: Normal work of breathing. Neurologic: No focal deficits.   Lab Results  Component Value Date   CREATININE 1.08 07/24/2019   BUN 15 07/24/2019   NA 140 07/24/2019   K 4.1 07/24/2019   CL 105 07/24/2019   CO2 28 07/24/2019   Lab Results  Component Value Date   ALT 20 07/24/2019   AST 17 07/24/2019   ALKPHOS 70 07/24/2019   BILITOT 0.7 07/24/2019   Lab Results  Component Value Date   HGBA1C 4.9 05/07/2019   HGBA1C 5.0 07/12/2018   Lab Results  Component Value Date   INSULIN 12.3 05/07/2019   Lab Results  Component Value Date   TSH 4.03 07/24/2019   Lab Results  Component Value Date   CHOL 115 07/24/2019   HDL 37.80 (L) 07/24/2019   LDLCALC 65 07/24/2019   LDLDIRECT 99.0 07/12/2018   TRIG 61.0 07/24/2019   CHOLHDL 3 07/24/2019   Lab Results  Component Value Date   WBC 4.8 07/24/2019   HGB 15.2 07/24/2019   HCT 44.3 07/24/2019   MCV 78.9 07/24/2019   PLT 172.0 07/24/2019   Attestation Statements:   Reviewed by clinician on day of visit: allergies, medications, problem list, medical history, surgical history, family history, social history, and previous encounter notes.  I, 07/26/2019, CMA, am acting as Insurance claims handler for Energy manager, DO.  I have reviewed the above documentation for accuracy and completeness, and I agree with the above. [employer], DO

## 2019-11-25 ENCOUNTER — Encounter: Payer: Self-pay | Admitting: Dietician

## 2019-11-25 ENCOUNTER — Encounter: Payer: 59 | Attending: Family Medicine | Admitting: Dietician

## 2019-11-25 ENCOUNTER — Other Ambulatory Visit: Payer: Self-pay

## 2019-11-25 DIAGNOSIS — Z713 Dietary counseling and surveillance: Secondary | ICD-10-CM | POA: Insufficient documentation

## 2019-11-25 NOTE — Progress Notes (Signed)
Medical Nutrition Therapy:  Appt start time: 1300 end time:  1330.   Assessment:  Primary concerns today:   He is in school, taking classes for healthcare administration.  This takes 2-3 hours each evening. He joined crossfit but has only gone once.  States that motivation is main deterrent but also time.  Stopped [employer] subscription.   Used My Fitness Pal for 1 week but has stopped tracking now. He states that he has continued to be mindful about his intake.  Was taking Rybelsus but stopped this.  It was helping to decrease his appetite but did not stop his cravings.  He is now on phentermine.  Weight: 258 lbs 07/18/2018  316 lbs 07/12/2018 Goal approximately 210 lbs. He lost to 252 lbs in the past.  Body Composition Scale Date 5/17  Current Body Weight 237 lbs  Total Body Fat % 26.9  Visceral Fat 15  Fat-Free Mass % 73   Total Body Water % 54  Muscle-Mass lbs 44.7  BMI 32.9  Body Fat Displacement          Torso  lbs 39.6         Left Leg  lbs 7.9         Right Leg  lbs 7.9         Left Arm  lbs 3.9         Right Arm   lbs 3.9      Breakfast:  Small breakfast burrito with 45 calorie burritos (sausage, cheese, potato, egg)- 250 calories each Lunch:  Triple Zero Yogurt, lean Cuisine meal Snack:  Occasional Fig Bar Dinner:  Sandwich, macaroni salad Snack:  Sugar free popsicle  Beverages:  Water, coke zero, carbonated water  Preferred Learning Style:   No preference indicated   Learning Readiness:   Ready  Change in progress  Estimated energy needs: 2200 calories 125 g protein  Progress Towards Goal(s):  In progress.   Nutritional Diagnosis:  NB-1.1 Food and nutrition-related knowledge deficit As related to mindful eating.  As evidenced by patient report.    Intervention:  Nutrition counseling related to mindfulness continued.  Discussed benefits of exercise.  Discussed progress and goals.  Teaching Method Utilized:  Visual Auditory Hands on  Barriers to  learning/adherence to lifestyle change: time, motivation  Demonstrated degree of understanding via:  Teach Back   Monitoring/Evaluation:  Dietary intake, exercise, and body weight in 4 month(s).

## 2019-11-27 ENCOUNTER — Telehealth: Payer: 59 | Admitting: Emergency Medicine

## 2019-11-27 DIAGNOSIS — J019 Acute sinusitis, unspecified: Secondary | ICD-10-CM | POA: Diagnosis not present

## 2019-11-27 NOTE — Progress Notes (Signed)
We are sorry that you are not feeling well.  Here is how we plan to help!  Based on what you have shared with me it looks like you have sinusitis.  Sinusitis is inflammation and infection in the sinus cavities of the head.  Based on your presentation I believe you most likely have Acute Bacterial Sinusitis.  This is an infection caused by bacteria and is treated with antibiotics. I have prescribed Doxycycline 100mg by mouth twice a day for 10 days. You may use an oral decongestant such as Mucinex D or if you have glaucoma or high blood pressure use plain Mucinex. Saline nasal spray help and can safely be used as often as needed for congestion.  If you develop worsening sinus pain, fever or notice severe headache and vision changes, or if symptoms are not better after completion of antibiotic, please schedule an appointment with a health care provider.    Sinus infections are not as easily transmitted as other respiratory infection, however we still recommend that you avoid close contact with loved ones, especially the very young and elderly.  Remember to wash your hands thoroughly throughout the day as this is the number one way to prevent the spread of infection!  Home Care:  Only take medications as instructed by your medical team.  Complete the entire course of an antibiotic.  Do not take these medications with alcohol.  A steam or ultrasonic humidifier can help congestion.  You can place a towel over your head and breathe in the steam from hot water coming from a faucet.  Avoid close contacts especially the very young and the elderly.  Cover your mouth when you cough or sneeze.  Always remember to wash your hands.  Get Help Right Away If:  You develop worsening fever or sinus pain.  You develop a severe head ache or visual changes.  Your symptoms persist after you have completed your treatment plan.  Make sure you  Understand these instructions.  Will watch your  condition.  Will get help right away if you are not doing well or get worse.  Your e-visit answers were reviewed by a board certified advanced clinical practitioner to complete your personal care plan.  Depending on the condition, your plan could have included both over the counter or prescription medications.  If there is a problem please reply  once you have received a response from your provider.  Your safety is important to us.  If you have drug allergies check your prescription carefully.    You can use MyChart to ask questions about today's visit, request a non-urgent call back, or ask for a work or school excuse for 24 hours related to this e-Visit. If it has been greater than 24 hours you will need to follow up with your provider, or enter a new e-Visit to address those concerns.  You will get an e-mail in the next two days asking about your experience.  I hope that your e-visit has been valuable and will speed your recovery. Thank you for using e-visits.  Greater than 5 but less than 10 minutes spent researching, coordinating, and implementing care for this patient today   

## 2019-12-03 ENCOUNTER — Other Ambulatory Visit (INDEPENDENT_AMBULATORY_CARE_PROVIDER_SITE_OTHER): Payer: Self-pay | Admitting: Family Medicine

## 2019-12-03 DIAGNOSIS — F3289 Other specified depressive episodes: Secondary | ICD-10-CM

## 2019-12-03 DIAGNOSIS — R632 Polyphagia: Secondary | ICD-10-CM

## 2019-12-04 MED ORDER — PHENTERMINE HCL 37.5 MG PO TABS: 18.7500 mg | ORAL_TABLET | Freq: Every day | ORAL | 0 refills | Status: DC

## 2019-12-04 MED ORDER — NALTREXONE HCL 50 MG PO TABS: 50.0000 mg | ORAL_TABLET | Freq: Every day | ORAL | 0 refills | Status: DC

## 2019-12-11 ENCOUNTER — Ambulatory Visit (INDEPENDENT_AMBULATORY_CARE_PROVIDER_SITE_OTHER): Payer: 59 | Admitting: Family Medicine

## 2019-12-11 ENCOUNTER — Other Ambulatory Visit: Payer: Self-pay

## 2019-12-11 ENCOUNTER — Encounter (INDEPENDENT_AMBULATORY_CARE_PROVIDER_SITE_OTHER): Payer: Self-pay | Admitting: Family Medicine

## 2019-12-11 VITALS — BP 116/70 | HR 98 | Temp 98.4°F | Ht 70.0 in | Wt 230.0 lb

## 2019-12-11 DIAGNOSIS — E559 Vitamin D deficiency, unspecified: Secondary | ICD-10-CM

## 2019-12-11 DIAGNOSIS — R5383 Other fatigue: Secondary | ICD-10-CM

## 2019-12-11 DIAGNOSIS — Z6833 Body mass index (BMI) 33.0-33.9, adult: Secondary | ICD-10-CM | POA: Diagnosis not present

## 2019-12-11 DIAGNOSIS — E669 Obesity, unspecified: Secondary | ICD-10-CM | POA: Diagnosis not present

## 2019-12-11 DIAGNOSIS — Z9189 Other specified personal risk factors, not elsewhere classified: Secondary | ICD-10-CM

## 2019-12-11 MED ORDER — VITAMIN D (ERGOCALCIFEROL) 1.25 MG (50000 UNIT) PO CAPS: 50000.0000 [IU] | ORAL_CAPSULE | ORAL | 0 refills | Status: DC

## 2019-12-11 NOTE — Progress Notes (Signed)
Chief Complaint:   OBESITY Kent Griffith is here to discuss his progress with his obesity treatment plan along with follow-up of his obesity related diagnoses. Kent Griffith is on keeping a food journal and adhering to recommended goals of 1500 calories and 125 grams of protein and states he is following his eating plan approximately 60% of the time. Kent Griffith states he is exercising for 0 minutes 0 times per week.  Today's visit was #: 12 Starting weight: 270 lbs Starting date: 05/07/2019 Today's weight: 230 lbs Today's date: 12/11/2019 Total lbs lost to date: 40 lbs Total lbs lost since last in-office visit: 8 lbs  Interim History: Kent Griffith reports that he loves the [employer].  He has made 3+ trips.  He has increased fatigue.  He endorses increased stress at work.  He says his sleep is poor.  Subjective:   1. Vitamin D deficiency Kent Griffith's Vitamin D level was 31.7 on 05/07/2019. He is currently taking prescription vitamin D 50,000 IU each week. He denies nausea, vomiting or muscle weakness.  2. Increased fatigue Kent Griffith reports having increased fatigue over the last few weeks.  Assessment/Plan:   1. Vitamin D deficiency Low Vitamin D level contributes to fatigue and are associated with obesity, breast, and colon cancer. He agrees to continue to take prescription Vitamin D @50 ,000 IU every week and will follow-up for routine testing of Vitamin D, at least 2-3 times per year to avoid over-replacement.  Orders - Vitamin D, Ergocalciferol, (DRISDOL) 1.25 MG (50000 UNIT) CAPS capsule; Take 1 capsule (50,000 Units total) by mouth every 7 (seven) days.  Dispense: 4 capsule; Refill: 0  2. Increased fatigue Will check labs today, as per below.  Orders - TSH - T4, free - Thyroid peroxidase antibody - Testosterone - Anemia panel  3. At risk for deficient intake of food Kent Griffith was given approximately 15 minutes of deficit intake of food prevention counseling today. Kent Griffith is at risk for eating too few  calories based on current food recall. He was encouraged to focus on meeting caloric and protein goals according to his recommended meal plan.   4. Class 1 obesity with serious comorbidity and body mass index (BMI) of 33.0 to 33.9 in adult, unspecified obesity type Kent Griffith is currently in the action stage of change. As such, his goal is to continue with weight loss efforts. He has agreed to keeping a food journal and adhering to recommended goals of 1500 calories and 125 grams of protein.   Exercise goals: For substantial health benefits, adults should do at least 150 minutes (2 hours and 30 minutes) a week of moderate-intensity, or 75 minutes (1 hour and 15 minutes) a week of vigorous-intensity aerobic physical activity, or an equivalent combination of moderate- and vigorous-intensity aerobic activity. Aerobic activity should be performed in episodes of at least 10 minutes, and preferably, it should be spread throughout the week.  Behavioral modification strategies: increasing lean protein intake, decreasing simple carbohydrates and increasing water intake.  Deane has agreed to follow-up with our clinic in 3 weeks. He was informed of the importance of frequent follow-up visits to maximize his success with intensive lifestyle modifications for his multiple health conditions.   Kent Griffith was informed we would discuss his lab results at his next visit unless there is a critical issue that needs to be addressed sooner. Kent Griffith agreed to keep his next visit at the agreed upon time to discuss these results.  Objective:   Blood pressure 116/70, pulse 98, temperature 98.4  F (36.9 C), temperature source Oral, height 5\' 10"  (1.778 m), weight 230 lb (104.3 kg), SpO2 98 %. Body mass index is 33 kg/m.  General: Cooperative, alert, well developed, in no acute distress. HEENT: Conjunctivae and lids unremarkable. Cardiovascular: Regular rhythm.  Lungs: Normal work of breathing. Neurologic: No focal deficits.   Lab  Results  Component Value Date   CREATININE 1.08 07/24/2019   BUN 15 07/24/2019   NA 140 07/24/2019   K 4.1 07/24/2019   CL 105 07/24/2019   CO2 28 07/24/2019   Lab Results  Component Value Date   ALT 20 07/24/2019   AST 17 07/24/2019   ALKPHOS 70 07/24/2019   BILITOT 0.7 07/24/2019   Lab Results  Component Value Date   HGBA1C 4.9 05/07/2019   HGBA1C 5.0 07/12/2018   Lab Results  Component Value Date   INSULIN 12.3 05/07/2019   Lab Results  Component Value Date   TSH 4.03 07/24/2019   Lab Results  Component Value Date   CHOL 115 07/24/2019   HDL 37.80 (L) 07/24/2019   LDLCALC 65 07/24/2019   LDLDIRECT 99.0 07/12/2018   TRIG 61.0 07/24/2019   CHOLHDL 3 07/24/2019   Lab Results  Component Value Date   WBC 4.8 07/24/2019   HGB 15.2 07/24/2019   HCT 44.3 07/24/2019   MCV 78.9 07/24/2019   PLT 172.0 07/24/2019   Attestation Statements:   Reviewed by clinician on day of visit: allergies, medications, problem list, medical history, surgical history, family history, social history, and previous encounter notes.  I, Water quality scientist, CMA, am acting as Location manager for [employer], DO.  I have reviewed the above documentation for accuracy and completeness, and I agree with the above. Briscoe Deutscher, DO

## 2019-12-12 ENCOUNTER — Encounter (INDEPENDENT_AMBULATORY_CARE_PROVIDER_SITE_OTHER): Payer: Self-pay | Admitting: Family Medicine

## 2019-12-12 LAB — ANEMIA PANEL
Ferritin: 78 ng/mL (ref 30–400)
Folate, Hemolysate: 454 ng/mL
Folate, RBC: 950 ng/mL (ref >498)
Hematocrit: 47.8 % (ref 37.5–51.0)
Iron Saturation: 36 % (ref 15–55)
Iron: 93 ug/dL (ref 38–169)
Retic Ct Pct: 1.4 % (ref 0.6–2.6)
Total Iron Binding Capacity: 261 ug/dL (ref 250–450)
UIBC: 168 ug/dL (ref 111–343)
Vitamin B-12: 422 pg/mL (ref 232–1245)

## 2019-12-12 LAB — THYROID PEROXIDASE ANTIBODY: Thyroperoxidase Ab SerPl-aCnc: <9 IU/mL (ref 0–34)

## 2019-12-12 LAB — TSH: TSH: 4.15 u[IU]/mL (ref 0.450–4.500)

## 2019-12-12 LAB — TESTOSTERONE: Testosterone: 401 ng/dL (ref 264–916)

## 2019-12-12 LAB — T4, FREE: Free T4: 1.38 ng/dL (ref 0.82–1.77)

## 2019-12-12 MED ORDER — TRAZODONE HCL 50 MG PO TABS: 50.0000 mg | ORAL_TABLET | Freq: Every day | ORAL | 1 refills | Status: DC

## 2019-12-12 NOTE — Telephone Encounter (Signed)
Please advise 

## 2019-12-13 MED FILL — traZODone HCL 50 MG TABS: 50 | 30 days supply | Qty: 60 | Fill #0

## 2019-12-18 ENCOUNTER — Other Ambulatory Visit (INDEPENDENT_AMBULATORY_CARE_PROVIDER_SITE_OTHER): Payer: Self-pay | Admitting: Family Medicine

## 2019-12-18 DIAGNOSIS — E559 Vitamin D deficiency, unspecified: Secondary | ICD-10-CM

## 2020-01-06 ENCOUNTER — Other Ambulatory Visit: Payer: Self-pay

## 2020-01-06 ENCOUNTER — Ambulatory Visit (INDEPENDENT_AMBULATORY_CARE_PROVIDER_SITE_OTHER): Payer: 59 | Admitting: Family Medicine

## 2020-01-06 VITALS — BP 104/69 | HR 108 | Temp 98.3°F | Ht 70.0 in | Wt 223.0 lb

## 2020-01-06 DIAGNOSIS — E669 Obesity, unspecified: Secondary | ICD-10-CM

## 2020-01-06 DIAGNOSIS — E8881 Metabolic syndrome: Secondary | ICD-10-CM

## 2020-01-06 DIAGNOSIS — I951 Orthostatic hypotension: Secondary | ICD-10-CM

## 2020-01-06 DIAGNOSIS — Z6832 Body mass index (BMI) 32.0-32.9, adult: Secondary | ICD-10-CM

## 2020-01-06 DIAGNOSIS — Z9189 Other specified personal risk factors, not elsewhere classified: Secondary | ICD-10-CM

## 2020-01-06 MED ORDER — METFORMIN HCL ER 500 MG PO TB24: 1500.0000 mg | ORAL_TABLET | Freq: Every day | ORAL | 0 refills | Status: DC

## 2020-01-06 NOTE — Progress Notes (Signed)
Chief Complaint:   OBESITY Kent Griffith is here to discuss his progress with his obesity treatment plan along with follow-up of his obesity related diagnoses. Kent Griffith is on keeping a food journal and adhering to recommended goals of 1500 calories and 125 grams of protein and states he is following his eating plan approximately 0% of the time. Kent Griffith states he is doing cardio for 60 minutes 1 time per week.  Today's visit was #: 13 Starting weight: 270 lbs Starting date: 05/07/2019 Today's weight: 223 lbs Today's date: 01/06/2020 Total lbs lost to date: 47 lbs Total lbs lost since last in-office visit: 7 lbs  Interim History: Kent Griffith reports that he has had increased irritability.  He went up on his phentermine since his father was in town.  Subjective:   1. Insulin resistance, with polyphagia [place] has a diagnosis of insulin resistance based on his elevated fasting insulin level >5. He continues to work on diet and exercise to decrease his risk of diabetes.  Lab Results  Component Value Date   INSULIN 12.3 05/07/2019   Lab Results  Component Value Date   HGBA1C 4.9 05/07/2019   2. Orthostatic hypotension Kent Griffith endorses orthostatic symptoms after standing from a squatting position.  3. At risk for constipation Kent Griffith is at increased risk for constipation due to inadequate water intake, changes in diet, and/or use of medications such as GLP1 agonists. Kent Griffith denies hard, infrequent stools currently.   Assessment/Plan:   1. Insulin resistance, with polyphagia Kent Griffith will continue to work on weight loss, exercise, and decreasing simple carbohydrates to help decrease the risk of diabetes. Kent Griffith agreed to follow-up with [place] as directed to closely monitor his progress.  Orders - metFORMIN (GLUCOPHAGE XR) 500 MG 24 hr tablet; Take 3 tablets (1,500 mg total) by mouth daily with breakfast.  Dispense: 30 tablet; Refill: 0  2. Orthostatic hypotension We will continue to monitor.  3. At risk for  constipation Kent Griffith was given approximately 15 minutes of counseling today regarding prevention of constipation. He was encouraged to increase water and fiber intake.   4. Class 1 obesity with serious comorbidity and body mass index (BMI) of 32.0 to 32.9 in adult, unspecified obesity type Kent Griffith is currently in the action stage of change. As such, his goal is to continue with weight loss efforts. He has agreed to practicing portion control and making smarter food choices, such as increasing vegetables and decreasing simple carbohydrates.   Exercise goals: For substantial health benefits, adults should do at least 150 minutes (2 hours and 30 minutes) a week of moderate-intensity, or 75 minutes (1 hour and 15 minutes) a week of vigorous-intensity aerobic physical activity, or an equivalent combination of moderate- and vigorous-intensity aerobic activity. Aerobic activity should be performed in episodes of at least 10 minutes, and preferably, it should be spread throughout the week.  Behavioral modification strategies: increasing lean protein intake, increasing water intake and emotional eating strategies.  Kent Griffith has agreed to follow-up with our clinic in 3 weeks. He was informed of the importance of frequent follow-up visits to maximize his success with intensive lifestyle modifications for his multiple health conditions.   Objective:   Blood pressure 104/69, pulse (!) 108, temperature 98.3 F (36.8 C), temperature source Oral, height 5\' 10"  (1.778 m), weight 223 lb (101.2 kg), SpO2 99 %. Body mass index is 32 kg/m.  General: Cooperative, alert, well developed, in no acute distress. HEENT: Conjunctivae and lids unremarkable. Cardiovascular: Regular rhythm.  Lungs: Normal work of  breathing. Neurologic: No focal deficits.   Lab Results  Component Value Date   CREATININE 1.08 07/24/2019   BUN 15 07/24/2019   NA 140 07/24/2019   K 4.1 07/24/2019   CL 105 07/24/2019   CO2 28 07/24/2019   Lab  Results  Component Value Date   ALT 20 07/24/2019   AST 17 07/24/2019   ALKPHOS 70 07/24/2019   BILITOT 0.7 07/24/2019   Lab Results  Component Value Date   HGBA1C 4.9 05/07/2019   HGBA1C 5.0 07/12/2018   Lab Results  Component Value Date   INSULIN 12.3 05/07/2019   Lab Results  Component Value Date   TSH 4.150 12/11/2019   Lab Results  Component Value Date   CHOL 115 07/24/2019   HDL 37.80 (L) 07/24/2019   LDLCALC 65 07/24/2019   LDLDIRECT 99.0 07/12/2018   TRIG 61.0 07/24/2019   CHOLHDL 3 07/24/2019   Lab Results  Component Value Date   WBC 4.8 07/24/2019   HGB 15.2 07/24/2019   HCT 47.8 12/11/2019   MCV 78.9 07/24/2019   PLT 172.0 07/24/2019   Lab Results  Component Value Date   IRON 93 12/11/2019   TIBC 261 12/11/2019   FERRITIN 78 12/11/2019   Attestation Statements:   Reviewed by clinician on day of visit: allergies, medications, problem list, medical history, surgical history, family history, social history, and previous encounter notes.  I, Water quality scientist, CMA, am acting as transcriptionist for Briscoe Deutscher, DO  I have reviewed the above documentation for accuracy and completeness, and I agree with the above. Briscoe Deutscher, DO

## 2020-01-07 ENCOUNTER — Encounter (INDEPENDENT_AMBULATORY_CARE_PROVIDER_SITE_OTHER): Payer: Self-pay | Admitting: Family Medicine

## 2020-01-10 ENCOUNTER — Encounter: Payer: Self-pay | Admitting: Family Medicine

## 2020-01-12 ENCOUNTER — Other Ambulatory Visit (INDEPENDENT_AMBULATORY_CARE_PROVIDER_SITE_OTHER): Payer: Self-pay | Admitting: Family Medicine

## 2020-01-12 DIAGNOSIS — F3289 Other specified depressive episodes: Secondary | ICD-10-CM

## 2020-01-12 DIAGNOSIS — E559 Vitamin D deficiency, unspecified: Secondary | ICD-10-CM

## 2020-01-12 DIAGNOSIS — R632 Polyphagia: Secondary | ICD-10-CM

## 2020-01-14 ENCOUNTER — Encounter (INDEPENDENT_AMBULATORY_CARE_PROVIDER_SITE_OTHER): Payer: Self-pay | Admitting: Family Medicine

## 2020-01-14 ENCOUNTER — Other Ambulatory Visit (INDEPENDENT_AMBULATORY_CARE_PROVIDER_SITE_OTHER): Payer: Self-pay | Admitting: Family Medicine

## 2020-01-14 DIAGNOSIS — R632 Polyphagia: Secondary | ICD-10-CM

## 2020-01-14 DIAGNOSIS — F3289 Other specified depressive episodes: Secondary | ICD-10-CM

## 2020-01-14 MED ORDER — VITAMIN D (ERGOCALCIFEROL) 1.25 MG (50000 UNIT) PO CAPS: 50000.0000 [IU] | ORAL_CAPSULE | ORAL | 0 refills | Status: DC

## 2020-01-14 NOTE — Telephone Encounter (Signed)
Please sent RXs

## 2020-01-27 ENCOUNTER — Other Ambulatory Visit: Payer: Self-pay | Admitting: Family Medicine

## 2020-01-27 DIAGNOSIS — E8881 Metabolic syndrome: Secondary | ICD-10-CM

## 2020-01-28 ENCOUNTER — Encounter: Payer: 59 | Attending: Family Medicine | Admitting: Dietician

## 2020-01-28 ENCOUNTER — Other Ambulatory Visit: Payer: Self-pay

## 2020-01-28 DIAGNOSIS — Z713 Dietary counseling and surveillance: Secondary | ICD-10-CM | POA: Insufficient documentation

## 2020-01-28 NOTE — Progress Notes (Signed)
Medical Nutrition Therapy:  Appt start time: 1145 end time:  1215   Assessment:  Primary concerns today:  Patient is here today alone and was last seen 11/25/2019.  He has lost another 9 lbs in the past 2 months. Has not been exercising. Has thought about buying weights for home. He walked for the first time in a long time. Just bought a body composition scale at his home. States that he has started to change his mentality more with food- less guilt and more analysis His father just left and had been staying with them for the past 2 months. His father did more of the cooking which was higher in fat and calories. Trying to do meal planning again. Eating out more (2 x per week) and ate more french fries   Weight: 228 lbs 02/01/2020 237 lbs 11/25/2019 258 lbs 07/18/2018 316 lbs 07/12/2018 Goal approximately 210 lbs. He lost to 252 lbs in the past.  Body Composition Scale Date 5/17  Current Body Weight 237 lbs  Total Body Fat % 26.9  Visceral Fat 15  Fat-Free Mass % 73   Total Body Water % 54  Muscle-Mass lbs 44.7  BMI 32.9  Body Fat Displacement          Torso  lbs 39.6         Left Leg  lbs 7.9         Right Leg  lbs 7.9         Left Arm  lbs 3.9         Right Arm   lbs 3.9   Body Composition Scale 7/24/ 2021  Current Body Weight 228.1 lbs  Total Body Fat % 25.2  Visceral Fat 14  Fat-Free Mass % 74.7   Total Body Water 55.7 125.9 lbs  Muscle-Mass lbs 42.9  BMI 31.6  Body Fat Displacement          Torso  lbs 35.6         Left Leg  lbs 7.1         Right Leg  lbs 7.1         Left Arm  lbs 3.5         Right Arm   lbs 3.5   Was taking Rybelsus but stopped this.  It was helping to decrease his appetite but did not stop his cravings.  He is now on phentermine but is currently out.    Preferred Learning Style:   No preference indicated   Learning Readiness:   Ready  Change in progress   DIETARY INTAKE:  24 hour recall: Breakfast:  [employer] english muffin,  morningstar sausage, egg, 1/2 slice cheese OR raisin english muffin with PB and jelly OR english muffin with cream cheese, melon Lunch:  Leftovers, yogurt, melon Dinner:  Taco salad- romaine lettuce, lean beef, black beans, corn, cheese, small amount of sour cream Snacks:  Generally none Beverages:  water, coke zero, carbonated water  Usual physical activity: limited walking  Estimated energy needs: 2200 calories 125 g protein  Progress Towards Goal(s):  In progress.   Nutritional Diagnosis:  NB-1.1 Food and nutrition-related knowledge deficit As related to mindful eating.  As evidenced by patient report.    Intervention:  Nutrition counseling continued related to minfullness.  Discussed progress and goals.  Plan: Aim for 150 minutes of exercise once per week Choose plenty of vegetables, fruits, whole grains, nuts, seeds and other minimally processed foods. Continue your mindfulness. Consider reading a  book by Mikeal Hawthorne on Division of Responsibility  You can google Mikeal Hawthorne podcast to listen to an interview   Teaching Method Utilized:  Auditory   Barriers to learning/adherence to lifestyle change: time, motivation  Demonstrated degree of understanding via:  Teach Back   Monitoring/Evaluation:  Dietary intake, exercise, and body weight prn.

## 2020-01-28 NOTE — Patient Instructions (Addendum)
Aim for 150 minutes of exercise once per week  Choose plenty of vegetables, fruits, whole grains, nuts, seeds and other minimally processed foods.  Continue your mindfulness.  Consider reading a book by Mikeal Hawthorne on Division of Responsibility  You can google Mikeal Hawthorne podcast to listen to an interview

## 2020-01-31 ENCOUNTER — Ambulatory Visit: Payer: 59 | Admitting: Dietician

## 2020-02-04 ENCOUNTER — Encounter: Payer: Self-pay | Admitting: Dietician

## 2020-02-05 ENCOUNTER — Encounter (INDEPENDENT_AMBULATORY_CARE_PROVIDER_SITE_OTHER): Payer: Self-pay | Admitting: Bariatrics

## 2020-02-05 ENCOUNTER — Other Ambulatory Visit: Payer: Self-pay

## 2020-02-05 ENCOUNTER — Ambulatory Visit (INDEPENDENT_AMBULATORY_CARE_PROVIDER_SITE_OTHER): Payer: 59 | Admitting: Bariatrics

## 2020-02-05 ENCOUNTER — Ambulatory Visit (INDEPENDENT_AMBULATORY_CARE_PROVIDER_SITE_OTHER): Payer: 59 | Admitting: Family Medicine

## 2020-02-05 VITALS — BP 106/69 | HR 83 | Temp 98.4°F | Ht 70.0 in | Wt 221.0 lb

## 2020-02-05 DIAGNOSIS — E8881 Metabolic syndrome: Secondary | ICD-10-CM | POA: Diagnosis not present

## 2020-02-05 DIAGNOSIS — E669 Obesity, unspecified: Secondary | ICD-10-CM

## 2020-02-05 DIAGNOSIS — E559 Vitamin D deficiency, unspecified: Secondary | ICD-10-CM | POA: Diagnosis not present

## 2020-02-05 DIAGNOSIS — Z9189 Other specified personal risk factors, not elsewhere classified: Secondary | ICD-10-CM

## 2020-02-05 DIAGNOSIS — E6609 Other obesity due to excess calories: Secondary | ICD-10-CM | POA: Insufficient documentation

## 2020-02-05 DIAGNOSIS — Z6831 Body mass index (BMI) 31.0-31.9, adult: Secondary | ICD-10-CM | POA: Diagnosis not present

## 2020-02-05 MED ORDER — METFORMIN HCL ER 500 MG PO TB24: 1500.0000 mg | ORAL_TABLET | Freq: Every day | ORAL | 0 refills | Status: DC

## 2020-02-05 MED ORDER — VITAMIN D (ERGOCALCIFEROL) 1.25 MG (50000 UNIT) PO CAPS: 50000.0000 [IU] | ORAL_CAPSULE | ORAL | 0 refills | Status: DC

## 2020-02-05 NOTE — Progress Notes (Signed)
Chief Complaint:   OBESITY Kent Griffith is here to discuss his progress with his obesity treatment plan along with follow-up of his obesity related diagnoses. [name] is practicing portion control and making smarter food choices, such as increasing vegetables and decreasing simple carbohydrates and states he is following his eating plan approximately 100% of the time. [name] states he is exercising 0 minutes 0 times per week.  Today's visit was #: 14 Starting weight: 270 lbs Starting date: 05/07/2019 Today's weight: 221 lbs Today's date: 02/05/2020 Total lbs lost to date: 49 Total lbs lost since last in-office visit: 2  Interim History: Kent Griffith is down an additional 2 lbs and is doing very well overall. He is taking Phentermine, which seems to be working well. He has taken the whole tablet of Phentermine and may be calling earlier for a prescription.  Subjective:   Vitamin D deficiency.  No nausea, vomiting, or muscle weakness.    Ref. Range 05/07/2019 10:28  Vitamin D, 25-Hydroxy Latest Ref Range: 30.0 - 100.0 ng/mL 31.7   Insulin resistance. [name] has a diagnosis of insulin resistance based on his elevated fasting insulin level >5. He continues to work on diet and exercise to decrease his risk of diabetes. No polyphagia.  Lab Results  Component Value Date   INSULIN 12.3 05/07/2019   Lab Results  Component Value Date   HGBA1C 4.9 05/07/2019   At risk for diabetes mellitus. Kent Griffith is at higher than average risk for developing diabetes due to insulin resistance and obesity.   Assessment/Plan:   Vitamin D deficiency. Low Vitamin D level contributes to fatigue and are associated with obesity, breast, and colon cancer. He was given a prescription for Vitamin D, Ergocalciferol, (DRISDOL) 1.25 MG (50000 UNIT) CAPS capsule every week #4 with 0 refills and will follow-up for routine testing of Vitamin D, at least 2-3 times per year to avoid over-replacement.   Insulin resistance. Kent Griffith will  continue to work on weight loss, exercise, and decreasing simple carbohydrates to help decrease the risk of diabetes. Kent Griffith agreed to follow-up with [place] as directed to closely monitor his progress. Prescription was given for metformin XR 500 mg, taking 3 tablets (1500 mg total) by mouth daily with breakfast #90 with 0 refills.  At risk for diabetes mellitus. Kent Griffith was given approximately 15 minutes of diabetes education and counseling today. We discussed intensive lifestyle modifications today with an emphasis on weight loss as well as increasing exercise and decreasing simple carbohydrates in his diet. We also reviewed medication options with an emphasis on risk versus benefit of those discussed.   Repetitive spaced learning was employed today to elicit superior memory formation and behavioral change.  Class 1 obesity with serious comorbidity and body mass index (BMI) of 31.0 to 31.9 in adult, unspecified obesity type.  Kent Griffith is currently in the action stage of change. As such, his goal is to continue with weight loss efforts. He has agreed to practicing portion control and making smarter food choices, such as increasing vegetables and decreasing simple carbohydrates.   He will work on meal planning and intentional eating.   Exercise goals: Kent Griffith will start walking for exercise and doing resistance exercises.  Behavioral modification strategies: increasing lean protein intake, decreasing simple carbohydrates, increasing vegetables, increasing water intake, decreasing eating out, no skipping meals, meal planning and cooking strategies and keeping healthy foods in the home.  Kent Griffith has agreed to follow-up with our clinic in 2-3 weeks. He was informed of the importance  of frequent follow-up visits to maximize his success with intensive lifestyle modifications for his multiple health conditions.   Objective:   Blood pressure 106/69, pulse 83, temperature 98.4 F (36.9 C), height 5\' 10"  (1.778 m), weight  (!) 221 lb (100.2 kg), SpO2 99 %. Body mass index is 31.71 kg/m.  General: Cooperative, alert, well developed, in no acute distress. HEENT: Conjunctivae and lids unremarkable. Cardiovascular: Regular rhythm.  Lungs: Normal work of breathing. Neurologic: No focal deficits.   Lab Results  Component Value Date   CREATININE 1.08 07/24/2019   BUN 15 07/24/2019   NA 140 07/24/2019   K 4.1 07/24/2019   CL 105 07/24/2019   CO2 28 07/24/2019   Lab Results  Component Value Date   ALT 20 07/24/2019   AST 17 07/24/2019   ALKPHOS 70 07/24/2019   BILITOT 0.7 07/24/2019   Lab Results  Component Value Date   HGBA1C 4.9 05/07/2019   HGBA1C 5.0 07/12/2018   Lab Results  Component Value Date   INSULIN 12.3 05/07/2019   Lab Results  Component Value Date   TSH 4.150 12/11/2019   Lab Results  Component Value Date   CHOL 115 07/24/2019   HDL 37.80 (L) 07/24/2019   LDLCALC 65 07/24/2019   LDLDIRECT 99.0 07/12/2018   TRIG 61.0 07/24/2019   CHOLHDL 3 07/24/2019   Lab Results  Component Value Date   WBC 4.8 07/24/2019   HGB 15.2 07/24/2019   HCT 47.8 12/11/2019   MCV 78.9 07/24/2019   PLT 172.0 07/24/2019   Lab Results  Component Value Date   IRON 93 12/11/2019   TIBC 261 12/11/2019   FERRITIN 78 12/11/2019   Attestation Statements:   Reviewed by clinician on day of visit: allergies, medications, problem list, medical history, surgical history, family history, social history, and previous encounter notes.  02/10/2020, am acting as Fernanda Drum for Energy manager, DO   I have reviewed the above documentation for accuracy and completeness, and I agree with the above. [employer], DO

## 2020-02-07 ENCOUNTER — Other Ambulatory Visit (INDEPENDENT_AMBULATORY_CARE_PROVIDER_SITE_OTHER): Payer: Self-pay | Admitting: Family Medicine

## 2020-02-07 DIAGNOSIS — F3289 Other specified depressive episodes: Secondary | ICD-10-CM

## 2020-02-12 MED ORDER — NALTREXONE HCL 50 MG PO TABS: 50.0000 mg | ORAL_TABLET | Freq: Every day | ORAL | 0 refills | Status: DC

## 2020-02-26 ENCOUNTER — Ambulatory Visit (INDEPENDENT_AMBULATORY_CARE_PROVIDER_SITE_OTHER): Payer: 59 | Admitting: Family Medicine

## 2020-03-05 ENCOUNTER — Other Ambulatory Visit (INDEPENDENT_AMBULATORY_CARE_PROVIDER_SITE_OTHER): Payer: Self-pay | Admitting: Bariatrics

## 2020-03-05 DIAGNOSIS — E8881 Metabolic syndrome: Secondary | ICD-10-CM

## 2020-03-05 MED ORDER — METFORMIN HCL ER 500 MG PO TB24: 1500.0000 mg | ORAL_TABLET | Freq: Every day | ORAL | 0 refills | Status: DC

## 2020-03-09 ENCOUNTER — Other Ambulatory Visit (INDEPENDENT_AMBULATORY_CARE_PROVIDER_SITE_OTHER): Payer: Self-pay | Admitting: Bariatrics

## 2020-03-09 DIAGNOSIS — E559 Vitamin D deficiency, unspecified: Secondary | ICD-10-CM

## 2020-03-09 MED ORDER — VITAMIN D (ERGOCALCIFEROL) 1.25 MG (50000 UNIT) PO CAPS: 50000.0000 [IU] | ORAL_CAPSULE | ORAL | 0 refills | Status: DC

## 2020-03-19 ENCOUNTER — Ambulatory Visit (INDEPENDENT_AMBULATORY_CARE_PROVIDER_SITE_OTHER): Payer: 59 | Admitting: Family Medicine

## 2020-03-19 ENCOUNTER — Encounter (INDEPENDENT_AMBULATORY_CARE_PROVIDER_SITE_OTHER): Payer: Self-pay | Admitting: Family Medicine

## 2020-03-19 ENCOUNTER — Other Ambulatory Visit: Payer: Self-pay

## 2020-03-19 VITALS — BP 121/70 | HR 86 | Temp 98.0°F | Ht 70.0 in | Wt 216.0 lb

## 2020-03-19 DIAGNOSIS — Z6830 Body mass index (BMI) 30.0-30.9, adult: Secondary | ICD-10-CM | POA: Diagnosis not present

## 2020-03-19 DIAGNOSIS — R632 Polyphagia: Secondary | ICD-10-CM

## 2020-03-19 DIAGNOSIS — F3289 Other specified depressive episodes: Secondary | ICD-10-CM

## 2020-03-19 DIAGNOSIS — E88819 Insulin resistance, unspecified: Secondary | ICD-10-CM

## 2020-03-19 DIAGNOSIS — E669 Obesity, unspecified: Secondary | ICD-10-CM

## 2020-03-19 DIAGNOSIS — E8881 Metabolic syndrome: Secondary | ICD-10-CM | POA: Diagnosis not present

## 2020-03-21 MED ORDER — PHENTERMINE HCL 37.5 MG PO TABS: 37.5000 mg | ORAL_TABLET | Freq: Every day | ORAL | 0 refills | Status: DC

## 2020-03-21 MED ORDER — NALTREXONE HCL 50 MG PO TABS: 50.0000 mg | ORAL_TABLET | Freq: Every day | ORAL | 0 refills | Status: DC

## 2020-03-21 NOTE — Progress Notes (Signed)
Chief Complaint:   OBESITY Kent Griffith is here to discuss his progress with his obesity treatment plan along with follow-up of his obesity related diagnoses. [name] is on practicing portion control and making smarter food choices, such as increasing vegetables and decreasing simple carbohydrates and states he is following his eating plan approximately 100% of the time. [name] states he is walking/cardio 90 minutes a few times per week.  Total lbs lost since last in-office visit: 5  Interim History: Kent Griffith continues to do well re: making healthy food choices and exercising. He is tolerating AOMs and would like to continue with them. Work has been very stressful. He will actually be transitioning to [facility] in two weeks!  Assessment/Plan:   1. Insulin resistance Goal is HgbA1c < 5.7 and insulin level closer to 5. The current medical regimen is effective;  continue present plan and medications.  2. Polyphagia The current medical regimen is effective;  continue present plan and medications.  3. Other depression, with emotional eating The current medical regimen is effective;  continue present plan and medications.  4. Class 1 obesity with body mass index (BMI) of 30.0 to 30.9 in adult, unspecified obesity type, unspecified whether serious comorbidity present  This patient 1) has no evidence of serious cardiovascular disease; 2) does not have serious psychiatric disease or a history of substance abuse; 3) has been informed about weight loss medications that are FDA-approved for long term use and told that these have been documented to be safe and effective whereas phentermine has not; 4) does not demonstrate a clinically significant increase in pulse or BP when taking phentermine; and 5) demonstrates significant weight loss while using the medication. Patient understands that long-term use of phentermine is considered off-label use of this medication, however, that the Endocrine Society and recent research  supports that long-term use of phentermine does not appear to have detrimental health effects when used in the appropriate patient. In addition, a 2019 study published in Obesity Journal on [id number] patients concluded that "recommendations to limit phentermine to less than 3 months do not align with current concepts of pharmacologic treatment of obesity", and that "long term phentermine users experience greater weight loss without apparent increases in cardiovascular risk".  We reviewed potential side effects including insomnia, dry mouth, increased heart rate and blood pressure, increased anxiety. We reviewed reducing caffeine consumption while taking phentermine, especially if the patient is experiencing side effects. The potential risks and benefits of phentermine have been reviewed with the patient, and alternative treatment options were discussed. All questions were answered, and the patient wishes to move forward with this medication.  I have consulted the [place] Controlled Substances Registry for this patient, and feel the risk/benefit ratio today is favorable for proceeding with this prescription for a controlled substance. The patient understands monitoring parameters and red flags.   Meds ordered this encounter  Medications  . phentermine (ADIPEX-P) 37.5 MG tablet    Sig: Take 1 tablet (37.5 mg total) by mouth daily before breakfast.    Dispense:  30 tablet    Refill:  0  . naltrexone (DEPADE) 50 MG tablet    Sig: Take 1 tablet (50 mg total) by mouth daily.    Dispense:  30 tablet    Refill:  0   Kent Griffith is currently in the action stage of change. As such, his goal is to continue with weight loss efforts. He has agreed to practicing portion control and making smarter food choices, such as increasing  vegetables and decreasing simple carbohydrates.   Exercise goals: For substantial health benefits, adults should do at least 150 minutes (2 hours and 30 minutes) a week of moderate-intensity, or 75  minutes (1 hour and 15 minutes) a week of vigorous-intensity aerobic physical activity, or an equivalent combination of moderate- and vigorous-intensity aerobic activity. Aerobic activity should be performed in episodes of at least 10 minutes, and preferably, it should be spread throughout the week. Adults should also include muscle-strengthening activities that involve all major muscle groups on 2 or more days a week.  Behavioral modification strategies: increasing lean protein intake and increasing water intake.  [name] has agreed to follow-up with our clinic in 3 weeks. He was informed of the importance of frequent follow-up visits to maximize his success with intensive lifestyle modifications for his multiple health conditions.   Objective:   Blood pressure 121/70, pulse 86, temperature 98 F (36.7 C), temperature source Oral, height 5\' 10"  (1.778 m), weight 216 lb (98 kg), SpO2 99 %. Body mass index is 30.99 kg/m.  General: Cooperative, alert, well developed, in no acute distress. HEENT: Conjunctivae and lids unremarkable. Cardiovascular: Regular rhythm.  Lungs: Normal work of breathing. Neurologic: No focal deficits.   Lab Results  Component Value Date   CREATININE 1.08 07/24/2019   BUN 15 07/24/2019   NA 140 07/24/2019   K 4.1 07/24/2019   CL 105 07/24/2019   CO2 28 07/24/2019   Lab Results  Component Value Date   ALT 20 07/24/2019   AST 17 07/24/2019   ALKPHOS 70 07/24/2019   BILITOT 0.7 07/24/2019   Lab Results  Component Value Date   HGBA1C 4.9 05/07/2019   HGBA1C 5.0 07/12/2018   Lab Results  Component Value Date   INSULIN 12.3 05/07/2019   Lab Results  Component Value Date   TSH 4.150 12/11/2019   Lab Results  Component Value Date   CHOL 115 07/24/2019   HDL 37.80 (L) 07/24/2019   LDLCALC 65 07/24/2019   LDLDIRECT 99.0 07/12/2018   TRIG 61.0 07/24/2019   CHOLHDL 3 07/24/2019   Lab Results  Component Value Date   WBC 4.8 07/24/2019   HGB 15.2  07/24/2019   HCT 47.8 12/11/2019   MCV 78.9 07/24/2019   PLT 172.0 07/24/2019   Lab Results  Component Value Date   IRON 93 12/11/2019   TIBC 261 12/11/2019   FERRITIN 78 12/11/2019   Attestation Statements:   Reviewed by clinician on day of visit: allergies, medications, problem list, medical history, surgical history, family history, social history, and previous encounter notes.

## 2020-03-22 ENCOUNTER — Other Ambulatory Visit (INDEPENDENT_AMBULATORY_CARE_PROVIDER_SITE_OTHER): Payer: Self-pay | Admitting: Family Medicine

## 2020-03-22 ENCOUNTER — Other Ambulatory Visit (INDEPENDENT_AMBULATORY_CARE_PROVIDER_SITE_OTHER): Payer: Self-pay | Admitting: Bariatrics

## 2020-03-22 DIAGNOSIS — E559 Vitamin D deficiency, unspecified: Secondary | ICD-10-CM

## 2020-03-22 DIAGNOSIS — R632 Polyphagia: Secondary | ICD-10-CM

## 2020-03-22 DIAGNOSIS — F3289 Other specified depressive episodes: Secondary | ICD-10-CM

## 2020-04-05 ENCOUNTER — Other Ambulatory Visit (INDEPENDENT_AMBULATORY_CARE_PROVIDER_SITE_OTHER): Payer: Self-pay | Admitting: Bariatrics

## 2020-04-05 DIAGNOSIS — E8881 Metabolic syndrome: Secondary | ICD-10-CM

## 2020-04-06 MED ORDER — METFORMIN HCL ER 500 MG PO TB24: 1500.0000 mg | ORAL_TABLET | Freq: Every day | ORAL | 0 refills | Status: DC

## 2020-04-08 ENCOUNTER — Other Ambulatory Visit: Payer: Self-pay

## 2020-04-08 ENCOUNTER — Ambulatory Visit (INDEPENDENT_AMBULATORY_CARE_PROVIDER_SITE_OTHER): Payer: 59 | Admitting: Family Medicine

## 2020-04-08 ENCOUNTER — Other Ambulatory Visit (INDEPENDENT_AMBULATORY_CARE_PROVIDER_SITE_OTHER): Payer: Self-pay | Admitting: Family Medicine

## 2020-04-08 ENCOUNTER — Encounter (INDEPENDENT_AMBULATORY_CARE_PROVIDER_SITE_OTHER): Payer: Self-pay | Admitting: Family Medicine

## 2020-04-08 VITALS — BP 105/67 | HR 72 | Temp 98.1°F | Ht 70.0 in | Wt 213.0 lb

## 2020-04-08 DIAGNOSIS — R632 Polyphagia: Secondary | ICD-10-CM | POA: Diagnosis not present

## 2020-04-08 DIAGNOSIS — F3289 Other specified depressive episodes: Secondary | ICD-10-CM

## 2020-04-08 DIAGNOSIS — E669 Obesity, unspecified: Secondary | ICD-10-CM

## 2020-04-08 DIAGNOSIS — Z6830 Body mass index (BMI) 30.0-30.9, adult: Secondary | ICD-10-CM

## 2020-04-08 MED ORDER — SAXENDA 18 MG/3ML ~~LOC~~ SOPN: 3.0000 mg | PEN_INJECTOR | Freq: Every day | SUBCUTANEOUS | 0 refills | Status: DC

## 2020-04-08 MED ORDER — INSULIN PEN NEEDLE 32G X 4 MM MISC: 1.0000 | Freq: Every day | 0 refills | Status: DC

## 2020-04-09 NOTE — Progress Notes (Signed)
Chief Complaint:   OBESITY Kent Griffith is here to discuss his progress with his obesity treatment plan along with follow-up of his obesity related diagnoses. Kent Griffith is on practicing portion control and making smarter food choices, such as increasing vegetables and decreasing simple carbohydrates and states he is following his eating plan approximately 0% of the time. Kent Griffith states he is exercising for 0 minutes 0 times per week.  Today's visit was #: 16 Starting weight: 270 lbs Starting date: 05/07/2019 Today's weight: 213 lbs Today's date: 04/08/2020 Total lbs lost to date: 57 lbs Total lbs lost since last in-office visit: 3 lbs Total weight loss percentage to date: -21.11%  Interim History:  Kent Griffith has had increased cravings for hunger and sweets.  He recently started a new job.    Assessment/Plan:   1. Polyphagia Worsened. Hyperphagia, also called polyphagia, refers to excessive feelings of hunger, which are not relieved by eating. This is more likely to be an issues for people that have diabetes, prediabetes, or insulin resistance. He will continue to focus on protein-rich, low simple carbohydrate foods. We reviewed the importance of hydration, regular exercise for stress reduction, and restorative sleep. If not improving, we will consider medication change.  2. Other depression, with emotional eating Behavior modification techniques were discussed today to help Kent Griffith deal with his emotional/non-hunger eating behaviors.    - Ambulatory referral to Behavioral Health  3. Class 1 obesity with body mass index (BMI) of 30.0 to 30.9 in adult, unspecified obesity type, unspecified whether serious comorbidity present  The current medical regimen is effective;  continue present plan and medications.  - Refill Liraglutide -Weight Management (SAXENDA) 18 MG/3ML SOPN; Inject 3 mg into the skin daily.  Dispense: 15 mL; Refill: 0 - Insulin Pen Needle 32G X 4 MM MISC; 1 each by Does not apply route  daily.  Dispense: 100 each; Refill: 0  Kent Griffith is currently in the action stage of change. As such, his goal is to continue with weight loss efforts. He has agreed to practicing portion control and making smarter food choices, such as increasing vegetables and decreasing simple carbohydrates.   Exercise goals: For substantial health benefits, adults should do at least 150 minutes (2 hours and 30 minutes) a week of moderate-intensity, or 75 minutes (1 hour and 15 minutes) a week of vigorous-intensity aerobic physical activity, or an equivalent combination of moderate- and vigorous-intensity aerobic activity. Aerobic activity should be performed in episodes of at least 10 minutes, and preferably, it should be spread throughout the week.  Behavioral modification strategies: increasing lean protein intake, decreasing simple carbohydrates and increasing vegetables.  Kent Griffith has agreed to follow-up with our clinic in 2-3 weeks. He was informed of the importance of frequent follow-up visits to maximize his success with intensive lifestyle modifications for his multiple health conditions.   Objective:   Blood pressure 105/67, pulse 72, temperature 98.1 F (36.7 C), temperature source Oral, height 5\' 10"  (1.778 m), weight 213 lb (96.6 kg), SpO2 100 %. Body mass index is 30.56 kg/m.  General: Cooperative, alert, well developed, in no acute distress. HEENT: Conjunctivae and lids unremarkable. Cardiovascular: Regular rhythm.  Lungs: Normal work of breathing. Neurologic: No focal deficits.   Lab Results  Component Value Date   CREATININE 1.08 07/24/2019   BUN 15 07/24/2019   NA 140 07/24/2019   K 4.1 07/24/2019   CL 105 07/24/2019   CO2 28 07/24/2019   Lab Results  Component Value Date   ALT 20  07/24/2019   AST 17 07/24/2019   ALKPHOS 70 07/24/2019   BILITOT 0.7 07/24/2019   Lab Results  Component Value Date   HGBA1C 4.9 05/07/2019   HGBA1C 5.0 07/12/2018   Lab Results  Component Value Date    INSULIN 12.3 05/07/2019   Lab Results  Component Value Date   TSH 4.150 12/11/2019   Lab Results  Component Value Date   CHOL 115 07/24/2019   HDL 37.80 (L) 07/24/2019   LDLCALC 65 07/24/2019   LDLDIRECT 99.0 07/12/2018   TRIG 61.0 07/24/2019   CHOLHDL 3 07/24/2019   Lab Results  Component Value Date   WBC 4.8 07/24/2019   HGB 15.2 07/24/2019   HCT 47.8 12/11/2019   MCV 78.9 07/24/2019   PLT 172.0 07/24/2019   Lab Results  Component Value Date   IRON 93 12/11/2019   TIBC 261 12/11/2019   FERRITIN 78 12/11/2019   Attestation Statements:   Reviewed by clinician on day of visit: allergies, medications, problem list, medical history, surgical history, family history, social history, and previous encounter notes.  I, Insurance claims handler, CMA, am acting as transcriptionist for Helane Rima, DO  I have reviewed the above documentation for accuracy and completeness, and I agree with the above. Helane Rima, DO

## 2020-04-14 ENCOUNTER — Other Ambulatory Visit (INDEPENDENT_AMBULATORY_CARE_PROVIDER_SITE_OTHER): Payer: Self-pay

## 2020-04-24 ENCOUNTER — Ambulatory Visit: Payer: 59 | Admitting: Psychologist

## 2020-04-29 ENCOUNTER — Other Ambulatory Visit (INDEPENDENT_AMBULATORY_CARE_PROVIDER_SITE_OTHER): Payer: Self-pay

## 2020-04-29 ENCOUNTER — Other Ambulatory Visit (INDEPENDENT_AMBULATORY_CARE_PROVIDER_SITE_OTHER): Payer: Self-pay | Admitting: Family Medicine

## 2020-04-29 ENCOUNTER — Ambulatory Visit (INDEPENDENT_AMBULATORY_CARE_PROVIDER_SITE_OTHER): Payer: 59 | Admitting: Family Medicine

## 2020-04-29 DIAGNOSIS — F3289 Other specified depressive episodes: Secondary | ICD-10-CM

## 2020-04-29 DIAGNOSIS — R632 Polyphagia: Secondary | ICD-10-CM

## 2020-04-29 DIAGNOSIS — E8881 Metabolic syndrome: Secondary | ICD-10-CM

## 2020-04-29 DIAGNOSIS — E559 Vitamin D deficiency, unspecified: Secondary | ICD-10-CM

## 2020-04-29 MED ORDER — PHENTERMINE HCL 37.5 MG PO TABS: 37.5000 mg | ORAL_TABLET | Freq: Every day | ORAL | 0 refills | Status: DC

## 2020-04-29 MED ORDER — METFORMIN HCL ER 500 MG PO TB24: 1500.0000 mg | ORAL_TABLET | Freq: Every day | ORAL | 0 refills | Status: DC

## 2020-04-29 MED ORDER — BUPROPION HCL ER (XL) 300 MG PO TB24: 300.0000 mg | ORAL_TABLET | Freq: Every day | ORAL | 0 refills | Status: DC

## 2020-04-29 MED ORDER — VITAMIN D (ERGOCALCIFEROL) 1.25 MG (50000 UNIT) PO CAPS: 50000.0000 [IU] | ORAL_CAPSULE | ORAL | 0 refills | Status: DC

## 2020-05-01 ENCOUNTER — Ambulatory Visit (INDEPENDENT_AMBULATORY_CARE_PROVIDER_SITE_OTHER): Payer: 59 | Admitting: Psychologist

## 2020-05-01 DIAGNOSIS — F411 Generalized anxiety disorder: Secondary | ICD-10-CM

## 2020-05-01 DIAGNOSIS — F330 Major depressive disorder, recurrent, mild: Secondary | ICD-10-CM | POA: Diagnosis not present

## 2020-05-10 ENCOUNTER — Other Ambulatory Visit: Payer: Self-pay | Admitting: Family Medicine

## 2020-05-11 ENCOUNTER — Other Ambulatory Visit: Payer: Self-pay | Admitting: Family Medicine

## 2020-05-11 MED ORDER — OMEPRAZOLE 20 MG PO CPDR: 20.0000 mg | DELAYED_RELEASE_CAPSULE | Freq: Every day | ORAL | 0 refills | Status: DC

## 2020-05-15 ENCOUNTER — Ambulatory Visit (INDEPENDENT_AMBULATORY_CARE_PROVIDER_SITE_OTHER): Payer: 59 | Admitting: Psychologist

## 2020-05-15 DIAGNOSIS — F330 Major depressive disorder, recurrent, mild: Secondary | ICD-10-CM

## 2020-05-15 DIAGNOSIS — F411 Generalized anxiety disorder: Secondary | ICD-10-CM

## 2020-05-18 ENCOUNTER — Other Ambulatory Visit (INDEPENDENT_AMBULATORY_CARE_PROVIDER_SITE_OTHER): Payer: Self-pay | Admitting: Family Medicine

## 2020-05-18 ENCOUNTER — Other Ambulatory Visit: Payer: Self-pay

## 2020-05-18 ENCOUNTER — Ambulatory Visit (INDEPENDENT_AMBULATORY_CARE_PROVIDER_SITE_OTHER): Payer: 59 | Admitting: Family Medicine

## 2020-05-18 ENCOUNTER — Encounter (INDEPENDENT_AMBULATORY_CARE_PROVIDER_SITE_OTHER): Payer: Self-pay | Admitting: Family Medicine

## 2020-05-18 VITALS — BP 128/65 | HR 83 | Temp 98.3°F | Ht 70.0 in | Wt 203.0 lb

## 2020-05-18 DIAGNOSIS — Z6830 Body mass index (BMI) 30.0-30.9, adult: Secondary | ICD-10-CM

## 2020-05-18 DIAGNOSIS — Z9189 Other specified personal risk factors, not elsewhere classified: Secondary | ICD-10-CM

## 2020-05-18 DIAGNOSIS — E559 Vitamin D deficiency, unspecified: Secondary | ICD-10-CM | POA: Diagnosis not present

## 2020-05-18 DIAGNOSIS — R5383 Other fatigue: Secondary | ICD-10-CM | POA: Diagnosis not present

## 2020-05-18 DIAGNOSIS — R632 Polyphagia: Secondary | ICD-10-CM

## 2020-05-18 DIAGNOSIS — E669 Obesity, unspecified: Secondary | ICD-10-CM | POA: Diagnosis not present

## 2020-05-18 DIAGNOSIS — F411 Generalized anxiety disorder: Secondary | ICD-10-CM | POA: Diagnosis not present

## 2020-05-18 MED ORDER — SAXENDA 18 MG/3ML ~~LOC~~ SOPN: 3.0000 mg | PEN_INJECTOR | Freq: Every day | SUBCUTANEOUS | 2 refills | Status: DC

## 2020-05-18 MED ORDER — ESCITALOPRAM OXALATE 10 MG PO TABS: 10.0000 mg | ORAL_TABLET | Freq: Every day | ORAL | 0 refills | Status: DC

## 2020-05-19 DIAGNOSIS — E559 Vitamin D deficiency, unspecified: Secondary | ICD-10-CM | POA: Diagnosis not present

## 2020-05-19 DIAGNOSIS — R5383 Other fatigue: Secondary | ICD-10-CM | POA: Diagnosis not present

## 2020-05-19 DIAGNOSIS — R632 Polyphagia: Secondary | ICD-10-CM | POA: Diagnosis not present

## 2020-05-19 NOTE — Progress Notes (Signed)
Chief Complaint:   OBESITY Kent Griffith is here to discuss his progress with his obesity treatment plan along with follow-up of his obesity related diagnoses.   Today's visit was #: 17 Starting weight: 270 lbs Starting date: 04/27/2019 Today's weight: 203 lbs Today's date: 05/18/2020 Total lbs lost to date: 67 lbs Body mass index is 29.13 kg/m.  Total weight loss percentage to date: -24.81%  Interim History: Kent Griffith has a protein bar or shake in the morning or has eggs, milk, and toast.  He is on Saxenda 1.8 mg (will increase to 2.4 mg tomorrow).  He is also taking phentermine 37.5 mg daily.  Nutrition Plan: practicing portion control and making smarter food choices, such as increasing vegetables and decreasing simple carbohydrates.  Anti-obesity medications: Saxenda and phentermine. Reported side effects: None. Activity: None. Stress:  High.  Assessment/Plan:   1. Polyphagia, with IR Hyperphagia, also called polyphagia, refers to excessive feelings of hunger. This is more likely to be an issues for people that have diabetes, prediabetes, or insulin resistance. He will continue to focus on protein-rich, low simple carbohydrate foods. We reviewed the importance of hydration, regular exercise for stress reduction, and restorative sleep.  Plan:  Will check labs, as per below.  - CBC with Differential/Platelet - Comprehensive metabolic panel - Hemoglobin A1c - Insulin, random - Lipid Panel With LDL/HDL Ratio - Vitamin B12  2. Other fatigue Kent Griffith endorses being more fatigued than usual recently.   Plan:  Will check labs, as per below.  - CBC with Differential/Platelet - Comprehensive metabolic panel - Lipid Panel With LDL/HDL Ratio - Vitamin B12  3. Vitamin D deficiency Not optimized. Current vitamin D is 31.7, tested on 05/07/2019. Optimal goal > 50 ng/dL.   Plan:  [x]   Continue Vitamin D @50 ,000 IU every week. []   Continue home supplement daily. [x]   Follow-up for routine  testing of Vitamin D at least 2-3 times per year to avoid over-replacement.  - VITAMIN D 25 Hydroxy (Vit-D Deficiency, Fractures)  4. GAD (generalized anxiety disorder) Kent Griffith is taking Lexapro 10 mg daily.  Will refill today, as per below.  -Refill escitalopram (LEXAPRO) 10 MG tablet; Take 1 tablet (10 mg total) by mouth daily.  Dispense: 30 tablet; Refill: 0  5. At risk for impaired metabolic function Kent Griffith is at risk for metabolic dysfunction if not getting enough calories/protein, maintaining muscle mass.  He was given approximately 15 minutes of impaired metabolic function prevention counseling today. We discussed intensive lifestyle modifications today with an emphasis on specific nutrition and exercise instructions and strategies.   6. Class 1 obesity with body mass index (BMI) of 30.0 to 30.9 in adult, unspecified obesity type, unspecified whether serious comorbidity present  -Refill Liraglutide -Weight Management (SAXENDA) 18 MG/3ML SOPN; Inject 3 mg into the skin daily.  Dispense: 15 mL; Refill: 2  Course: Kent Griffith is currently in the action stage of change. As such, his goal is to continue with weight loss efforts.   Nutrition goals: He has agreed to practicing portion control and making smarter food choices, such as increasing vegetables and decreasing simple carbohydrates.   Exercise goals: For substantial health benefits, adults should do at least 150 minutes (2 hours and 30 minutes) a week of moderate-intensity, or 75 minutes (1 hour and 15 minutes) a week of vigorous-intensity aerobic physical activity, or an equivalent combination of moderate- and vigorous-intensity aerobic activity. Aerobic activity should be performed in episodes of at least 10 minutes, and preferably, it should be  spread throughout the week.  Focus on strength.  Behavioral modification strategies: increasing lean protein intake and better snacking choices.  Kent Griffith has agreed to follow-up with our clinic in 4-6  weeks. He was informed of the importance of frequent follow-up visits to maximize his success with intensive lifestyle modifications for his multiple health conditions.   Kent Griffith was informed we would discuss his lab results at his next visit unless there is a critical issue that needs to be addressed sooner. Kent Griffith agreed to keep his next visit at the agreed upon time to discuss these results.  Objective:   Blood pressure 128/65, pulse 83, temperature 98.3 F (36.8 C), temperature source Oral, height 5\' 10"  (1.778 m), weight 203 lb (92.1 kg), SpO2 99 %. Body mass index is 29.13 kg/m.  General: Cooperative, alert, well developed, in no acute distress. HEENT: Conjunctivae and lids unremarkable. Cardiovascular: Regular rhythm.  Lungs: Normal work of breathing. Neurologic: No focal deficits.   Lab Results  Component Value Date   CREATININE 1.08 07/24/2019   BUN 15 07/24/2019   NA 140 07/24/2019   K 4.1 07/24/2019   CL 105 07/24/2019   CO2 28 07/24/2019   Lab Results  Component Value Date   ALT 20 07/24/2019   AST 17 07/24/2019   ALKPHOS 70 07/24/2019   BILITOT 0.7 07/24/2019   Lab Results  Component Value Date   HGBA1C 4.9 05/07/2019   HGBA1C 5.0 07/12/2018   Lab Results  Component Value Date   INSULIN 12.3 05/07/2019   Lab Results  Component Value Date   TSH 4.150 12/11/2019   Lab Results  Component Value Date   CHOL 115 07/24/2019   HDL 37.80 (L) 07/24/2019   LDLCALC 65 07/24/2019   LDLDIRECT 99.0 07/12/2018   TRIG 61.0 07/24/2019   CHOLHDL 3 07/24/2019   Lab Results  Component Value Date   WBC 4.8 07/24/2019   HGB 15.2 07/24/2019   HCT 47.8 12/11/2019   MCV 78.9 07/24/2019   PLT 172.0 07/24/2019   Lab Results  Component Value Date   IRON 93 12/11/2019   TIBC 261 12/11/2019   FERRITIN 78 12/11/2019   Attestation Statements:   Reviewed by clinician on day of visit: allergies, medications, problem list, medical history, surgical history, family history,  social history, and previous encounter notes.  I, 02/10/2020, CMA, am acting as transcriptionist for Insurance claims handler, DO  I have reviewed the above documentation for accuracy and completeness, and I agree with the above. Helane Rima, DO

## 2020-05-20 LAB — HEMOGLOBIN A1C
Est. average glucose Bld gHb Est-mCnc: 91 mg/dL
Hgb A1c MFr Bld: 4.8 % (ref 4.8–5.6)

## 2020-05-20 LAB — COMPREHENSIVE METABOLIC PANEL
ALT: 14 IU/L (ref 0–44)
AST: 17 IU/L (ref 0–40)
Albumin/Globulin Ratio: 2.2 (ref 1.2–2.2)
Albumin: 5.1 g/dL (ref 4.1–5.2)
Alkaline Phosphatase: 78 IU/L (ref 44–121)
BUN/Creatinine Ratio: 15 (ref 9–20)
BUN: 14 mg/dL (ref 6–20)
Bilirubin Total: 0.7 mg/dL (ref 0.0–1.2)
CO2: 26 mmol/L (ref 20–29)
Calcium: 9.5 mg/dL (ref 8.7–10.2)
Chloride: 101 mmol/L (ref 96–106)
Creatinine, Ser: 0.91 mg/dL (ref 0.76–1.27)
GFR calc Af Amer: 134 mL/min/{1.73_m2} (ref >59)
GFR calc non Af Amer: 116 mL/min/{1.73_m2} (ref >59)
Globulin, Total: 2.3 g/dL (ref 1.5–4.5)
Glucose: 81 mg/dL (ref 65–99)
Potassium: 4.3 mmol/L (ref 3.5–5.2)
Sodium: 140 mmol/L (ref 134–144)
Total Protein: 7.4 g/dL (ref 6.0–8.5)

## 2020-05-20 LAB — CBC WITH DIFFERENTIAL/PLATELET
Basophils Absolute: 0 10*3/uL (ref 0.0–0.2)
Basos: 1 %
EOS (ABSOLUTE): 0.1 10*3/uL (ref 0.0–0.4)
Eos: 2 %
Hematocrit: 43.6 % (ref 37.5–51.0)
Hemoglobin: 14.6 g/dL (ref 13.0–17.7)
Immature Grans (Abs): 0 10*3/uL (ref 0.0–0.1)
Immature Granulocytes: 0 %
Lymphocytes Absolute: 1.2 10*3/uL (ref 0.7–3.1)
Lymphs: 30 %
MCH: 28.1 pg (ref 26.6–33.0)
MCHC: 33.5 g/dL (ref 31.5–35.7)
MCV: 84 fL (ref 79–97)
Monocytes Absolute: 0.3 10*3/uL (ref 0.1–0.9)
Monocytes: 8 %
Neutrophils Absolute: 2.2 10*3/uL (ref 1.4–7.0)
Neutrophils: 59 %
Platelets: 160 10*3/uL (ref 150–450)
RBC: 5.19 x10E6/uL (ref 4.14–5.80)
RDW: 13.1 % (ref 11.6–15.4)
WBC: 3.8 10*3/uL (ref 3.4–10.8)

## 2020-05-20 LAB — INSULIN, RANDOM: INSULIN: 4.5 u[IU]/mL (ref 2.6–24.9)

## 2020-05-20 LAB — LIPID PANEL WITH LDL/HDL RATIO
Cholesterol, Total: 125 mg/dL (ref 100–199)
HDL: 45 mg/dL (ref >39)
LDL Chol Calc (NIH): 68 mg/dL (ref 0–99)
LDL/HDL Ratio: 1.5 ratio (ref 0.0–3.6)
Triglycerides: 55 mg/dL (ref 0–149)
VLDL Cholesterol Cal: 12 mg/dL (ref 5–40)

## 2020-05-20 LAB — VITAMIN D 25 HYDROXY (VIT D DEFICIENCY, FRACTURES): Vit D, 25-Hydroxy: 82.8 ng/mL (ref 30.0–100.0)

## 2020-05-20 LAB — VITAMIN B12: Vitamin B-12: 410 pg/mL (ref 232–1245)

## 2020-05-25 ENCOUNTER — Other Ambulatory Visit (INDEPENDENT_AMBULATORY_CARE_PROVIDER_SITE_OTHER): Payer: Self-pay | Admitting: Family Medicine

## 2020-05-25 MED ORDER — LINACLOTIDE 145 MCG PO CAPS: 145.0000 ug | ORAL_CAPSULE | Freq: Every day | ORAL | 0 refills | Status: DC

## 2020-05-25 NOTE — Addendum Note (Signed)
Addended by: Helane Rima R on: 05/25/2020 12:20 PM   Modules accepted: Orders

## 2020-06-01 ENCOUNTER — Other Ambulatory Visit (INDEPENDENT_AMBULATORY_CARE_PROVIDER_SITE_OTHER): Payer: Self-pay

## 2020-06-01 ENCOUNTER — Other Ambulatory Visit (INDEPENDENT_AMBULATORY_CARE_PROVIDER_SITE_OTHER): Payer: Self-pay | Admitting: Family Medicine

## 2020-06-01 DIAGNOSIS — F411 Generalized anxiety disorder: Secondary | ICD-10-CM

## 2020-06-01 DIAGNOSIS — E8881 Metabolic syndrome: Secondary | ICD-10-CM

## 2020-06-01 DIAGNOSIS — R632 Polyphagia: Secondary | ICD-10-CM

## 2020-06-01 MED ORDER — METFORMIN HCL ER 500 MG PO TB24: 1500.0000 mg | ORAL_TABLET | Freq: Every day | ORAL | 0 refills | Status: DC

## 2020-06-01 MED ORDER — ESCITALOPRAM OXALATE 10 MG PO TABS: 10.0000 mg | ORAL_TABLET | Freq: Every day | ORAL | 0 refills | Status: DC

## 2020-06-01 MED ORDER — PHENTERMINE HCL 37.5 MG PO TABS: 37.5000 mg | ORAL_TABLET | Freq: Every day | ORAL | 0 refills | Status: DC

## 2020-06-12 ENCOUNTER — Ambulatory Visit (INDEPENDENT_AMBULATORY_CARE_PROVIDER_SITE_OTHER): Payer: 59 | Admitting: Psychologist

## 2020-06-12 DIAGNOSIS — F330 Major depressive disorder, recurrent, mild: Secondary | ICD-10-CM | POA: Diagnosis not present

## 2020-06-12 DIAGNOSIS — F411 Generalized anxiety disorder: Secondary | ICD-10-CM | POA: Diagnosis not present

## 2020-06-15 ENCOUNTER — Ambulatory Visit (INDEPENDENT_AMBULATORY_CARE_PROVIDER_SITE_OTHER): Payer: 59 | Admitting: Family Medicine

## 2020-06-15 ENCOUNTER — Other Ambulatory Visit: Payer: Self-pay

## 2020-06-15 ENCOUNTER — Encounter (INDEPENDENT_AMBULATORY_CARE_PROVIDER_SITE_OTHER): Payer: Self-pay | Admitting: Family Medicine

## 2020-06-15 VITALS — BP 111/16 | HR 91 | Temp 98.3°F | Ht 70.0 in | Wt 196.0 lb

## 2020-06-15 DIAGNOSIS — Z6830 Body mass index (BMI) 30.0-30.9, adult: Secondary | ICD-10-CM

## 2020-06-15 DIAGNOSIS — E669 Obesity, unspecified: Secondary | ICD-10-CM | POA: Diagnosis not present

## 2020-06-15 DIAGNOSIS — R632 Polyphagia: Secondary | ICD-10-CM | POA: Diagnosis not present

## 2020-06-15 DIAGNOSIS — K5903 Drug induced constipation: Secondary | ICD-10-CM | POA: Diagnosis not present

## 2020-06-15 DIAGNOSIS — Z9189 Other specified personal risk factors, not elsewhere classified: Secondary | ICD-10-CM

## 2020-06-15 DIAGNOSIS — F411 Generalized anxiety disorder: Secondary | ICD-10-CM

## 2020-06-15 NOTE — Progress Notes (Signed)
Chief Complaint:   OBESITY Kent Griffith is here to discuss his progress with his obesity treatment plan along with follow-up of his obesity related diagnoses.   Today's visit was #: 18 Starting weight: 270 lbs Starting date: 04/27/2019 Today's weight: 196 lbs Today's date: 06/15/2020 Total lbs lost to date: 74 lbs Body mass index is 28.12 kg/m.  Total weight loss percentage to date: -27.41%  Interim History: Kent Griffith says he is happy with his weight loss.  We discussed increasing his muscle mass.   Nutrition Plan: practicing portion control and making smarter food choices, such as increasing vegetables and decreasing simple carbohydrates.  Anti-obesity medications: Saxenda, metformin, phentermine.  Hunger is well controlled.  Cravings are well controlled. Activity: Strength training once per week.  Assessment/Plan:   1. Polyphagia, with IR Improving. He will continue to focus on protein-rich, low simple carbohydrate foods. We reviewed the importance of hydration, regular exercise for stress reduction, and restorative sleep.  Kent Griffith is taking metformin 1500 mg daily.  Visceral fat rating is 6.  2. Drug-induced constipation Improved. Rx Linzess.  Counseling Getting to Good Bowel Health: Your goal is to have one soft bowel movement each day. Drink at least 8 glasses of water each day. Eat plenty of fiber (goal is over 25 grams each day). It is best to get most of your fiber from dietary sources which includes leafy green vegetables, fresh fruit, and whole grains. You may need to add fiber with the help of OTC fiber supplements. These include Metamucil, Citrucel, and Benefiber. If you are still having trouble, try adding Miralax or Magnesium Citrate. If all of these changes do not work, contact me.  3. GAD (generalized anxiety disorder) Kent Griffith is taking Wellbutrin XL 300 mg daily. Motivational interviewing as well as evidence-based interventions for health behavior change were utilized today  including the discussion of self monitoring techniques, problem-solving barriers and SMART goal setting techniques.   4. At risk for deficient intake of food Kent Griffith was given approximately 8 minutes of deficit intake of food prevention counseling today. Kent Griffith is at risk for eating too few calories based on current food recall. He was encouraged to focus on meeting caloric and protein goals according to his recommended meal plan.   5. Class 1 obesity with body mass index (BMI) of 30.0 to 30.9 in adult, unspecified obesity type, unspecified whether serious comorbidity present  Course: Kent Griffith is currently in the action stage of change. As such, his goal is to continue with weight loss efforts.   Nutrition goals: He has agreed to practicing portion control and making smarter food choices, such as increasing vegetables and decreasing simple carbohydrates.   Exercise goals: For substantial health benefits, adults should do at least 150 minutes (2 hours and 30 minutes) a week of moderate-intensity, or 75 minutes (1 hour and 15 minutes) a week of vigorous-intensity aerobic physical activity, or an equivalent combination of moderate- and vigorous-intensity aerobic activity. Aerobic activity should be performed in episodes of at least 10 minutes, and preferably, it should be spread throughout the week.  Behavioral modification strategies: no skipping meals and meal planning and cooking strategies.  Kent Griffith has agreed to follow-up with our clinic in 4 weeks. He was informed of the importance of frequent follow-up visits to maximize his success with intensive lifestyle modifications for his multiple health conditions.   Objective:   Blood pressure (!) 111/16, pulse 91, temperature 98.3 F (36.8 C), temperature source Oral, height 5\' 10"  (1.778 m), weight 196  lb (88.9 kg), SpO2 100 %. Body mass index is 28.12 kg/m.  General: Cooperative, alert, well developed, in no acute distress. HEENT: Conjunctivae and lids  unremarkable. Cardiovascular: Regular rhythm.  Lungs: Normal work of breathing. Neurologic: No focal deficits.   Lab Results  Component Value Date   CREATININE 0.91 05/19/2020   BUN 14 05/19/2020   NA 140 05/19/2020   K 4.3 05/19/2020   CL 101 05/19/2020   CO2 26 05/19/2020   Lab Results  Component Value Date   ALT 14 05/19/2020   AST 17 05/19/2020   ALKPHOS 78 05/19/2020   BILITOT 0.7 05/19/2020   Lab Results  Component Value Date   HGBA1C 4.8 05/19/2020   HGBA1C 4.9 05/07/2019   HGBA1C 5.0 07/12/2018   Lab Results  Component Value Date   INSULIN 4.5 05/19/2020   INSULIN 12.3 05/07/2019   Lab Results  Component Value Date   TSH 4.150 12/11/2019   Lab Results  Component Value Date   CHOL 125 05/19/2020   HDL 45 05/19/2020   LDLCALC 68 05/19/2020   LDLDIRECT 99.0 07/12/2018   TRIG 55 05/19/2020   CHOLHDL 3 07/24/2019   Lab Results  Component Value Date   WBC 3.8 05/19/2020   HGB 14.6 05/19/2020   HCT 43.6 05/19/2020   MCV 84 05/19/2020   PLT 160 05/19/2020   Lab Results  Component Value Date   IRON 93 12/11/2019   TIBC 261 12/11/2019   FERRITIN 78 12/11/2019   Attestation Statements:   Reviewed by clinician on day of visit: allergies, medications, problem list, medical history, surgical history, family history, social history, and previous encounter notes.  I, Insurance claims handler, CMA, am acting as transcriptionist for Helane Rima, DO  I have reviewed the above documentation for accuracy and completeness, and I agree with the above. Helane Rima, DO

## 2020-06-29 ENCOUNTER — Other Ambulatory Visit (INDEPENDENT_AMBULATORY_CARE_PROVIDER_SITE_OTHER): Payer: Self-pay

## 2020-06-29 DIAGNOSIS — R632 Polyphagia: Secondary | ICD-10-CM

## 2020-06-29 DIAGNOSIS — E8881 Metabolic syndrome: Secondary | ICD-10-CM

## 2020-06-30 ENCOUNTER — Other Ambulatory Visit (INDEPENDENT_AMBULATORY_CARE_PROVIDER_SITE_OTHER): Payer: Self-pay | Admitting: Family Medicine

## 2020-06-30 MED ORDER — PHENTERMINE HCL 37.5 MG PO TABS: 37.5000 mg | ORAL_TABLET | Freq: Every day | ORAL | 0 refills | Status: DC

## 2020-06-30 MED ORDER — METFORMIN HCL ER 500 MG PO TB24: 1500.0000 mg | ORAL_TABLET | Freq: Every day | ORAL | 0 refills | Status: DC

## 2020-07-13 ENCOUNTER — Other Ambulatory Visit (INDEPENDENT_AMBULATORY_CARE_PROVIDER_SITE_OTHER): Payer: Self-pay

## 2020-07-13 ENCOUNTER — Other Ambulatory Visit (INDEPENDENT_AMBULATORY_CARE_PROVIDER_SITE_OTHER): Payer: Self-pay | Admitting: Family Medicine

## 2020-07-13 DIAGNOSIS — F411 Generalized anxiety disorder: Secondary | ICD-10-CM

## 2020-07-13 MED ORDER — ESCITALOPRAM OXALATE 10 MG PO TABS: 10.0000 mg | ORAL_TABLET | Freq: Every day | ORAL | 0 refills | Status: DC

## 2020-07-17 ENCOUNTER — Ambulatory Visit: Payer: 59 | Admitting: Psychologist

## 2020-07-21 ENCOUNTER — Telehealth (INDEPENDENT_AMBULATORY_CARE_PROVIDER_SITE_OTHER): Payer: 59 | Admitting: Family Medicine

## 2020-07-24 ENCOUNTER — Encounter: Payer: 59 | Admitting: Family Medicine

## 2020-07-31 ENCOUNTER — Ambulatory Visit (INDEPENDENT_AMBULATORY_CARE_PROVIDER_SITE_OTHER): Payer: 59 | Admitting: Psychologist

## 2020-07-31 DIAGNOSIS — F411 Generalized anxiety disorder: Secondary | ICD-10-CM

## 2020-07-31 DIAGNOSIS — F330 Major depressive disorder, recurrent, mild: Secondary | ICD-10-CM

## 2020-08-02 ENCOUNTER — Other Ambulatory Visit: Payer: Self-pay | Admitting: Family Medicine

## 2020-08-03 ENCOUNTER — Other Ambulatory Visit: Payer: Self-pay | Admitting: *Deleted

## 2020-08-03 MED ORDER — OMEPRAZOLE 20 MG PO CPDR: 20.0000 mg | DELAYED_RELEASE_CAPSULE | Freq: Every day | ORAL | 0 refills | Status: DC

## 2020-08-05 ENCOUNTER — Encounter (INDEPENDENT_AMBULATORY_CARE_PROVIDER_SITE_OTHER): Payer: Self-pay | Admitting: Family Medicine

## 2020-08-05 ENCOUNTER — Ambulatory Visit (INDEPENDENT_AMBULATORY_CARE_PROVIDER_SITE_OTHER): Payer: 59 | Admitting: Family Medicine

## 2020-08-05 ENCOUNTER — Other Ambulatory Visit (INDEPENDENT_AMBULATORY_CARE_PROVIDER_SITE_OTHER): Payer: Self-pay | Admitting: Family Medicine

## 2020-08-05 ENCOUNTER — Other Ambulatory Visit: Payer: Self-pay

## 2020-08-05 VITALS — BP 106/66 | HR 73 | Temp 97.9°F | Ht 70.0 in | Wt 188.0 lb

## 2020-08-05 DIAGNOSIS — Z9189 Other specified personal risk factors, not elsewhere classified: Secondary | ICD-10-CM

## 2020-08-05 DIAGNOSIS — E669 Obesity, unspecified: Secondary | ICD-10-CM

## 2020-08-05 DIAGNOSIS — M25552 Pain in left hip: Secondary | ICD-10-CM | POA: Diagnosis not present

## 2020-08-05 DIAGNOSIS — E8881 Metabolic syndrome: Secondary | ICD-10-CM

## 2020-08-05 DIAGNOSIS — Z6830 Body mass index (BMI) 30.0-30.9, adult: Secondary | ICD-10-CM

## 2020-08-05 DIAGNOSIS — F419 Anxiety disorder, unspecified: Secondary | ICD-10-CM | POA: Diagnosis not present

## 2020-08-05 MED ORDER — BUPROPION HCL ER (XL) 300 MG PO TB24: 300.0000 mg | ORAL_TABLET | Freq: Every day | ORAL | 0 refills | Status: DC

## 2020-08-05 MED ORDER — INSULIN PEN NEEDLE 32G X 4 MM MISC
1.0000 | Freq: Every day | 0 refills | Status: DC
Start: 2020-08-05 — End: 2020-09-28

## 2020-08-05 MED ORDER — METFORMIN HCL ER 500 MG PO TB24: 1500.0000 mg | ORAL_TABLET | Freq: Every day | ORAL | 0 refills | Status: DC

## 2020-08-05 MED ORDER — PHENTERMINE HCL 37.5 MG PO TABS: 37.5000 mg | ORAL_TABLET | Freq: Every day | ORAL | 0 refills | Status: DC

## 2020-08-05 MED ORDER — ESCITALOPRAM OXALATE 10 MG PO TABS: 10.0000 mg | ORAL_TABLET | Freq: Every day | ORAL | 0 refills | Status: DC

## 2020-08-06 NOTE — Progress Notes (Signed)
Chief Complaint:   OBESITY Kent Griffith is here to discuss his progress with his obesity treatment plan along with follow-up of his obesity related diagnoses.   Today's visit was #: 19 Starting weight: 270 lbs Starting date: 04/27/2019 Today's weight: 188 lbs Today's date: 08/05/2020 Total lbs lost to date: 82 lbs Body mass index is 26.98 kg/m.  Total weight loss percentage to date: -30.37%  Interim History: Kent Griffith says he is ready to start increasing his muscle mass. Nutrition Plan: practicing portion control and making smarter food choices, such as increasing vegetables and decreasing simple carbohydrates for 100% of the time.  Anti-obesity medications: phentermine 37.5 mg daily, Saxenda 3 mg subcutaneously daily. Reported side effects: None. Activity: Increased activity.  Assessment/Plan:   1. Left hip pain With low back pain.  Interested in OMT.  Will place referral to Dr. Katrinka Blazing in Sports Medicine today.  - Ambulatory referral to Sports Medicine, Dr. Katrinka Blazing  2. Insulin resistance At goal. Goal is HgbA1c < 5.7, fasting insulin closer to 5.  Medication: metformin 1,500 mg daily.  He will continue to focus on protein-rich, low simple carbohydrate foods. We reviewed the importance of hydration, regular exercise for stress reduction, and restorative sleep.   Lab Results  Component Value Date   HGBA1C 4.8 05/19/2020   Lab Results  Component Value Date   INSULIN 4.5 05/19/2020   INSULIN 12.3 05/07/2019   -Refill metFORMIN (GLUCOPHAGE XR) 500 MG 24 hr tablet; Take 3 tablets (1,500 mg total) by mouth daily with breakfast.  Dispense: 90 tablet; Refill: 0  3. Anxiety, with emotional eating Kent Griffith is taking Wellbutrin, phentermine, and Lexapro.  Will fill all, as per below.  -Refill buPROPion (WELLBUTRIN XL) 300 MG 24 hr tablet; Take 1 tablet (300 mg total) by mouth daily.  Dispense: 90 tablet; Refill: 0 -Refill escitalopram (LEXAPRO) 10 MG tablet; Take 1 tablet (10 mg total) by mouth  daily.  Dispense: 30 tablet; Refill: 0 -Refill Insulin Pen Needle 32G X 4 MM MISC; 1 each by Does not apply route daily.  Dispense: 100 each; Refill: 0 -Refill phentermine (ADIPEX-P) 37.5 MG tablet; Take 1 tablet (37.5 mg total) by mouth daily before breakfast.  Dispense: 30 tablet; Refill: 0  I have consulted the [place] Controlled Substances Registry for this patient, and feel the risk/benefit ratio today is favorable for proceeding with this prescription for a controlled substance. The patient understands monitoring parameters and red flags.   4. At risk for deficient intake of food Kent Griffith was given approximately 8 minutes of deficit intake of food prevention counseling today. Kent Griffith is at risk for eating too few calories based on working towards maintenance model. He was encouraged to focus on meeting caloric and protein goals according to his recommended meal plan.   5. Class 1 obesity with body mass index (BMI) of 30.0 to 30.9 in adult, unspecified obesity type, unspecified whether serious comorbidity present  Course: Kent Griffith is currently in the action stage of change. As such, his goal is to continue with weight loss efforts.   Nutrition goals: He has agreed to practicing portion control and making smarter food choices, such as increasing vegetables and decreasing simple carbohydrates.   Exercise goals: As is.  Behavioral modification strategies: emotional eating strategies.  Kent Griffith has agreed to follow-up with our clinic in 3 weeks. He was informed of the importance of frequent follow-up visits to maximize his success with intensive lifestyle modifications for his multiple health conditions.   Objective:   Blood  pressure 106/66, pulse 73, temperature 97.9 F (36.6 C), temperature source Oral, height 5\' 10"  (1.778 m), weight 188 lb (85.3 kg), SpO2 99 %. Body mass index is 26.98 kg/m.  General: Cooperative, alert, well developed, in no acute distress. HEENT: Conjunctivae and lids  unremarkable. Cardiovascular: Regular rhythm.  Lungs: Normal work of breathing. Neurologic: No focal deficits.   Lab Results  Component Value Date   CREATININE 0.91 05/19/2020   BUN 14 05/19/2020   NA 140 05/19/2020   K 4.3 05/19/2020   CL 101 05/19/2020   CO2 26 05/19/2020   Lab Results  Component Value Date   ALT 14 05/19/2020   AST 17 05/19/2020   ALKPHOS 78 05/19/2020   BILITOT 0.7 05/19/2020   Lab Results  Component Value Date   HGBA1C 4.8 05/19/2020   HGBA1C 4.9 05/07/2019   HGBA1C 5.0 07/12/2018   Lab Results  Component Value Date   INSULIN 4.5 05/19/2020   INSULIN 12.3 05/07/2019   Lab Results  Component Value Date   TSH 4.150 12/11/2019   Lab Results  Component Value Date   CHOL 125 05/19/2020   HDL 45 05/19/2020   LDLCALC 68 05/19/2020   LDLDIRECT 99.0 07/12/2018   TRIG 55 05/19/2020   CHOLHDL 3 07/24/2019   Lab Results  Component Value Date   WBC 3.8 05/19/2020   HGB 14.6 05/19/2020   HCT 43.6 05/19/2020   MCV 84 05/19/2020   PLT 160 05/19/2020   Lab Results  Component Value Date   IRON 93 12/11/2019   TIBC 261 12/11/2019   FERRITIN 78 12/11/2019   Attestation Statements:   Reviewed by clinician on day of visit: allergies, medications, problem list, medical history, surgical history, family history, social history, and previous encounter notes.  I, 02/10/2020, CMA, am acting as transcriptionist for Insurance claims handler, DO  I have reviewed the above documentation for accuracy and completeness, and I agree with the above. Helane Rima, DO

## 2020-08-14 ENCOUNTER — Encounter: Payer: 59 | Admitting: Family Medicine

## 2020-08-27 ENCOUNTER — Encounter (INDEPENDENT_AMBULATORY_CARE_PROVIDER_SITE_OTHER): Payer: Self-pay | Admitting: Family Medicine

## 2020-08-27 ENCOUNTER — Ambulatory Visit (INDEPENDENT_AMBULATORY_CARE_PROVIDER_SITE_OTHER): Payer: 59 | Admitting: Family Medicine

## 2020-08-27 ENCOUNTER — Other Ambulatory Visit (INDEPENDENT_AMBULATORY_CARE_PROVIDER_SITE_OTHER): Payer: Self-pay | Admitting: Family Medicine

## 2020-08-27 VITALS — BP 101/65 | HR 77 | Temp 98.0°F | Ht 70.0 in | Wt 187.0 lb

## 2020-08-27 DIAGNOSIS — Z6830 Body mass index (BMI) 30.0-30.9, adult: Secondary | ICD-10-CM

## 2020-08-27 DIAGNOSIS — K5903 Drug induced constipation: Secondary | ICD-10-CM

## 2020-08-27 DIAGNOSIS — F419 Anxiety disorder, unspecified: Secondary | ICD-10-CM | POA: Diagnosis not present

## 2020-08-27 DIAGNOSIS — E8881 Metabolic syndrome: Secondary | ICD-10-CM

## 2020-08-27 DIAGNOSIS — E669 Obesity, unspecified: Secondary | ICD-10-CM | POA: Diagnosis not present

## 2020-08-27 MED ORDER — METFORMIN HCL ER 500 MG PO TB24: 1500.0000 mg | ORAL_TABLET | Freq: Every day | ORAL | 0 refills | Status: DC

## 2020-08-27 MED ORDER — LINACLOTIDE 145 MCG PO CAPS: 145.0000 ug | ORAL_CAPSULE | Freq: Every day | ORAL | 0 refills | Status: DC | PRN

## 2020-08-27 MED ORDER — ESCITALOPRAM OXALATE 10 MG PO TABS: 10.0000 mg | ORAL_TABLET | Freq: Every day | ORAL | 0 refills | Status: DC

## 2020-08-27 MED ORDER — SAXENDA 18 MG/3ML ~~LOC~~ SOPN: 3.0000 mg | PEN_INJECTOR | Freq: Every day | SUBCUTANEOUS | 0 refills | Status: DC

## 2020-08-27 MED ORDER — PHENTERMINE HCL 37.5 MG PO TABS: 37.5000 mg | ORAL_TABLET | Freq: Every day | ORAL | 0 refills | Status: DC

## 2020-09-02 NOTE — Progress Notes (Signed)
Chief Complaint:   OBESITY Kent Griffith is here to discuss his progress with his obesity treatment plan along with follow-up of his obesity related diagnoses.   Today's visit was #: 20 Starting weight: 270 lbs Starting date: 04/27/2019 Today's weight: 187 lbs Today's date: 08/27/2020 Total lbs lost to date: 83 lbs Body mass index is 26.83 kg/m.  Total weight loss percentage to date: -30.74%  Interim History:  Kent Griffith says he is working on maintaining. Current Meal Plan: practicing portion control and making smarter food choices, such as increasing vegetables and decreasing simple carbohydrates for 100% of the time.  Current Exercise Plan: None at this time. Current Anti-Obesity Medications: Saxenda and phentermine. Side effects: None.  Assessment/Plan:   1. Insulin resistance At goal. Goal is HgbA1c < 5.7, fasting insulin closer to 5.  Medication: metformin 1,500 mg daily.    Plan:  He will continue to focus on protein-rich, low simple carbohydrate foods. We reviewed the importance of hydration, regular exercise for stress reduction, and restorative sleep.   Lab Results  Component Value Date   HGBA1C 4.8 05/19/2020   Lab Results  Component Value Date   INSULIN 4.5 05/19/2020   INSULIN 12.3 05/07/2019   - Refill metFORMIN (GLUCOPHAGE XR) 500 MG 24 hr tablet; Take 3 tablets (1,500 mg total) by mouth daily with breakfast.  Dispense: 90 tablet; Refill: 0  2. Drug-induced constipation Well controlled with Linzess.  Plan:  Will refill Linzess today, as per below.  - Refill linaclotide (LINZESS) 145 MCG CAPS capsule; Take 1 capsule (145 mcg total) by mouth daily as needed.  Dispense: 30 capsule; Refill: 0  3. Anxiety, with emotional eating Reece is taking Wellbutrin, phentermine, and Lexapro.    Plan:  Will refill Lexapro and phentermine, as per below.  Behavior modification techniques were discussed today to help Ngoc deal with his anxiety.  Orders and follow up as documented in  patient record.   - Refill escitalopram (LEXAPRO) 10 MG tablet; Take 1 tablet (10 mg total) by mouth daily.  Dispense: 30 tablet; Refill: 0 - Refill phentermine (ADIPEX-P) 37.5 MG tablet; Take 1 tablet (37.5 mg total) by mouth daily before breakfast.  Dispense: 30 tablet; Refill: 0  4. Class 1 obesity with body mass index (BMI) of 30.0 to 30.9 in adult, unspecified obesity type, unspecified whether serious comorbidity present  - Refill Liraglutide -Weight Management (SAXENDA) 18 MG/3ML SOPN; Inject 3 mg into the skin daily.  Dispense: 45 mL; Refill: 0  Course: Kent Griffith is currently in the action stage of change. As such, his goal is to continue with weight loss efforts.   Nutrition goals: He has agreed to practicing portion control and making smarter food choices, such as increasing vegetables and decreasing simple carbohydrates.   Exercise goals: As is.  Behavioral modification strategies: increasing lean protein intake and emotional eating strategies.  Rayaan has agreed to follow-up with our clinic in 4 weeks. He was informed of the importance of frequent follow-up visits to maximize his success with intensive lifestyle modifications for his multiple health conditions.   Objective:   Blood pressure 101/65, pulse 77, temperature 98 F (36.7 C), temperature source Oral, height 5\' 10"  (1.778 m), weight 187 lb (84.8 kg), SpO2 98 %. Body mass index is 26.83 kg/m.  General: Cooperative, alert, well developed, in no acute distress. HEENT: Conjunctivae and lids unremarkable. Cardiovascular: Regular rhythm.  Lungs: Normal work of breathing. Neurologic: No focal deficits.   Lab Results  Component Value Date  CREATININE 0.91 05/19/2020   BUN 14 05/19/2020   NA 140 05/19/2020   K 4.3 05/19/2020   CL 101 05/19/2020   CO2 26 05/19/2020   Lab Results  Component Value Date   ALT 14 05/19/2020   AST 17 05/19/2020   ALKPHOS 78 05/19/2020   BILITOT 0.7 05/19/2020   Lab Results  Component  Value Date   HGBA1C 4.8 05/19/2020   HGBA1C 4.9 05/07/2019   HGBA1C 5.0 07/12/2018   Lab Results  Component Value Date   INSULIN 4.5 05/19/2020   INSULIN 12.3 05/07/2019   Lab Results  Component Value Date   TSH 4.150 12/11/2019   Lab Results  Component Value Date   CHOL 125 05/19/2020   HDL 45 05/19/2020   LDLCALC 68 05/19/2020   LDLDIRECT 99.0 07/12/2018   TRIG 55 05/19/2020   CHOLHDL 3 07/24/2019   Lab Results  Component Value Date   WBC 3.8 05/19/2020   HGB 14.6 05/19/2020   HCT 43.6 05/19/2020   MCV 84 05/19/2020   PLT 160 05/19/2020   Lab Results  Component Value Date   IRON 93 12/11/2019   TIBC 261 12/11/2019   FERRITIN 78 12/11/2019   Attestation Statements:   Reviewed by clinician on day of visit: allergies, medications, problem list, medical history, surgical history, family history, social history, and previous encounter notes.  I, Insurance claims handler, CMA, am acting as transcriptionist for Helane Rima, DO  I have reviewed the above documentation for accuracy and completeness, and I agree with the above. Helane Rima, DO

## 2020-09-04 ENCOUNTER — Other Ambulatory Visit: Payer: Self-pay

## 2020-09-04 ENCOUNTER — Ambulatory Visit (INDEPENDENT_AMBULATORY_CARE_PROVIDER_SITE_OTHER): Payer: 59 | Admitting: Family Medicine

## 2020-09-04 ENCOUNTER — Encounter: Payer: Self-pay | Admitting: Family Medicine

## 2020-09-04 VITALS — BP 112/62 | HR 82 | Temp 98.5°F | Ht 70.0 in | Wt 185.4 lb

## 2020-09-04 DIAGNOSIS — G4700 Insomnia, unspecified: Secondary | ICD-10-CM | POA: Diagnosis not present

## 2020-09-04 DIAGNOSIS — K219 Gastro-esophageal reflux disease without esophagitis: Secondary | ICD-10-CM

## 2020-09-04 DIAGNOSIS — Z0001 Encounter for general adult medical examination with abnormal findings: Secondary | ICD-10-CM | POA: Diagnosis not present

## 2020-09-04 DIAGNOSIS — F321 Major depressive disorder, single episode, moderate: Secondary | ICD-10-CM

## 2020-09-04 DIAGNOSIS — F419 Anxiety disorder, unspecified: Secondary | ICD-10-CM

## 2020-09-04 NOTE — Assessment & Plan Note (Signed)
On Lexapro and Wellbutrin.  Doing well with these.

## 2020-09-04 NOTE — Assessment & Plan Note (Signed)
-  Continue Prilosec 20mg daily

## 2020-09-04 NOTE — Assessment & Plan Note (Signed)
Doing well on Lexapro and Wellbutrin.

## 2020-09-04 NOTE — Assessment & Plan Note (Signed)
Doing well with trazodone daily as needed.  Discussed sleep hygeine.

## 2020-09-04 NOTE — Progress Notes (Signed)
Chief Complaint:  Kent Griffith is a 25 y.o. male who presents today for his annual comprehensive physical exam.    Assessment/Plan:  Chronic Problems Addressed Today: Insomnia Doing well with trazodone daily as needed.  Discussed sleep hygeine.   Anxiety On Lexapro and Wellbutrin.  Doing well with these.  Depression, major, single episode, moderate (HCC) Doing well on Lexapro and Wellbutrin.  GERD (gastroesophageal reflux disease) Continue Prilosec 20 mg daily.   Body mass index is 26.6 kg/m. / Overweight Congratulated patient on weight loss.  He is working with healthy weight management.  Preventative Healthcare: UTD on labs and vaccines.   Patient Counseling(The following topics were reviewed and/or handout was given):  -Nutrition: Stressed importance of moderation in sodium/caffeine intake, saturated fat and cholesterol, caloric balance, sufficient intake of fresh fruits, vegetables, and fiber.  -Stressed the importance of regular exercise.   -Substance Abuse: Discussed cessation/primary prevention of tobacco, alcohol, or other drug use; driving or other dangerous activities under the influence; availability of treatment for abuse.   -Injury prevention: Discussed safety belts, safety helmets, smoke detector, smoking near bedding or upholstery.   -Sexuality: Discussed sexually transmitted diseases, partner selection, use of condoms, avoidance of unintended pregnancy and contraceptive alternatives.   -Dental health: Discussed importance of regular tooth brushing, flossing, and dental visits.  -Health maintenance and immunizations reviewed. Please refer to Health maintenance section.  Return to care in 1 year for next preventative visit.     Subjective:  HPI:  He has no acute complaints today.   Lifestyle Diet: Balanced. 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 Exercise: Limited.  Depression screen PHQ 2/9 05/07/2019  Decreased Interest 1  Down, Depressed, Hopeless 2   PHQ - 2 Score 3  Altered sleeping 1  Tired, decreased energy 3  Change in appetite 2  Feeling bad or failure about yourself  3  Trouble concentrating 2  Moving slowly or fidgety/restless 0  Suicidal thoughts 0  PHQ-9 Score 14  Difficult doing work/chores Not difficult at all    Health Maintenance Due  Topic Date Due  . Hepatitis C Screening  Never done  . COVID-19 Vaccine (3 - Booster for Pfizer series) 05/09/2020     ROS: Per HPI, otherwise a complete review of systems was negative.   PMH:  The following were reviewed and entered/updated in epic: Past Medical History:  Diagnosis Date  . Anxiety   . Depression   . GERD (gastroesophageal reflux disease)   . Hypothyroidism   . IBS (irritable bowel syndrome)   . Joint pain   . Lactose intolerance   . Obesity   . Sleep apnea    Patient Active Problem List   Diagnosis Date Noted  . Insomnia 09/04/2020  . GAD (generalized anxiety disorder) 06/15/2020  . Polyphagia, with IR 06/15/2020  . Class 1 obesity due to excess calories with body mass index (BMI) of 31.0 to 31.9 in adult 02/05/2020  . Subclinical hypothyroidism 09/04/2019  . Arthritis of right knee 08/14/2019  . 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 Lactose intolerance in adult 08/14/2019  . OSA (obstructive sleep apnea) 08/14/2019  . GERD (gastroesophageal reflux disease) 11/10/2017  . Depression, major, single episode, moderate (HCC) 11/10/2017  . Anxiety 11/10/2017   Past Surgical History:  Procedure Laterality Date  . KNEE ARTHROSCOPY, MEDIAL PATELLO FEMORAL LIGAMENT REPAIR Right   . TONSILLECTOMY AND ADENOIDECTOMY      Family History  Problem Relation Age of Onset  . Depression Mother   .  Suicidality Mother   . Anxiety disorder Mother   . Bipolar disorder Mother   . Drug abuse Mother   . Obesity Mother   . Alcohol abuse Father   . Anxiety disorder Father   . Depression Father   . Bipolar disorder Father   .  Schizophrenia Father   . Liver disease Father   . Drug abuse Father   . Colon polyps Paternal Grandmother   . Esophageal cancer Neg Hx   . Pancreatic cancer Neg Hx   . Stomach cancer Neg Hx     Medications- reviewed and updated Current Outpatient Medications  Medication Sig Dispense Refill  . buPROPion (WELLBUTRIN XL) 300 MG 24 hr tablet Take 1 tablet (300 mg total) by mouth daily. 90 tablet 0  . Cholecalciferol (VITAMIN D) 50 MCG (2000 UT) CAPS Take by mouth.    . escitalopram (LEXAPRO) 10 MG tablet Take 1 tablet (10 mg total) by mouth daily. 30 tablet 0  . Insulin Pen Needle 32G X 4 MM MISC 1 each by Does not apply route daily. 100 each 0  . linaclotide (LINZESS) 145 MCG CAPS capsule Take 1 capsule (145 mcg total) by mouth daily as needed. 30 capsule 0  . Liraglutide -Weight Management (SAXENDA) 18 MG/3ML SOPN Inject 3 mg into the skin daily. 45 mL 0  . metFORMIN (GLUCOPHAGE XR) 500 MG 24 hr tablet Take 3 tablets (1,500 mg total) by mouth daily with breakfast. 90 tablet 0  . Multiple Vitamins-Minerals (MULTIVITAMIN WITH MINERALS) tablet Take 1 tablet by mouth daily.    Marland Kitchen omeprazole (PRILOSEC) 20 MG capsule Take 1 capsule (20 mg total) by mouth daily. 90 capsule 0  . phentermine (ADIPEX-P) 37.5 MG tablet Take 1 tablet (37.5 mg total) by mouth daily before breakfast. 30 tablet 0  . traZODone (DESYREL) 50 MG tablet Take 1-2 tablets (50-100 mg total) by mouth at bedtime. 60 tablet 1   No current facility-administered medications for this visit.    Allergies-reviewed and updated Allergies  Allergen Reactions  . Penicillins Anaphylaxis, Hives, Itching and Rash  . Vyvanse [Lisdexamfetamine Dimesylate] Other (See Comments)    Tics    Social History   Socioeconomic History  . Marital status: Married    Spouse name: Sadie   . Number of children: 1  . Years of education: Not on file  . Highest education level: Not on file  Occupational History  . Not on file  Tobacco Use  .  Smoking status: Never Smoker  . Smokeless tobacco: Never Used  Vaping Use  . Vaping Use: Never used  Substance and Sexual Activity  . Alcohol use: Yes    Comment: occ.  . Drug use: Not on file  . Sexual activity: Not on file  Other Topics Concern  . Not on file  Social History Narrative  . Not on file   Social Determinants of Health   Financial Resource Strain: Not on file  Food Insecurity: Not on file  Transportation Needs: Not on file  Physical Activity: Not on file  Stress: Not on file  Social Connections: Not on file        Objective:  Physical Exam: BP 112/62   Pulse 82   Temp 98.5 F (36.9 C) (Temporal)   Ht 5\' 10"  (1.778 m)   Wt 185 lb 6.4 oz (84.1 kg)   SpO2 100%   BMI 26.60 kg/m   Body mass index is 26.6 kg/m. Wt Readings from Last 3 Encounters:  09/04/20 185  lb 6.4 oz (84.1 kg)  08/27/20 187 lb (84.8 kg)  08/05/20 188 lb (85.3 kg)   Gen: NAD, resting comfortably HEENT: TMs normal bilaterally. OP clear. No thyromegaly noted.  CV: RRR with no murmurs appreciated Pulm: NWOB, CTAB with no crackles, wheezes, or rhonchi GI: Normal bowel sounds present. Soft, Nontender, Nondistended. MSK: no edema, cyanosis, or clubbing noted Skin: warm, dry Neuro: CN2-12 grossly intact. Strength 5/5 in upper and lower extremities. Reflexes symmetric and intact bilaterally.  Psych: Normal affect and thought content     Caleb M. Jimmey Ralph, MD 09/04/2020 12:18 PM

## 2020-09-04 NOTE — Patient Instructions (Signed)
It was very nice to see you today!  Keep up the good work!  I will see you back in a year.  Come back to see me sooner if needed.  Take care, Dr Jimmey Ralph  Please try these tips to maintain a healthy lifestyle:   Eat at least 3 REAL meals and 1-2 snacks per day.  Aim for no more than 5 hours between eating.  If you eat breakfast, please do so within one hour of getting up.    Each meal should contain half fruits/vegetables, one quarter protein, and one quarter carbs (no bigger than a computer mouse)   Cut down on sweet beverages. This includes juice, soda, and sweet tea.     Drink at least 1 glass of water with each meal and aim for at least 8 glasses per day   Exercise at least 150 minutes every week.    Preventive Care 56-71 Years Old, Male Preventive care refers to lifestyle choices and visits with your health care provider that can promote health and wellness. This includes:  A yearly physical exam. This is also called an annual wellness visit.  Regular dental and eye exams.  Immunizations.  Screening for certain conditions.  Healthy lifestyle choices, such as: ? Eating a healthy diet. ? Getting regular exercise. ? Not using drugs or products that contain nicotine and tobacco. ? Limiting alcohol use. What can I expect for my preventive care visit? Physical exam Your health care provider may check your:  Height and weight. These may be used to calculate your BMI (body mass index). BMI is a measurement that tells if you are at a healthy weight.  Heart rate and blood pressure.  Body temperature.  Skin for abnormal spots. Counseling Your health care provider may ask you questions about your:  Past medical problems.  Family's medical history.  Alcohol, tobacco, and drug use.  Emotional well-being.  Home life and relationship well-being.  Sexual activity.  Diet, exercise, and sleep habits.  Work and work Astronomer.  Access to firearms. What  immunizations do I need? Vaccines are usually given at various ages, according to a schedule. Your health care provider will recommend vaccines for you based on your age, medical history, and lifestyle or other factors, such as travel or where you work.   What tests do I need? Blood tests  Lipid and cholesterol levels. These may be checked every 5 years starting at age 39.  Hepatitis C test.  Hepatitis B test. Screening  Diabetes screening. This is done by checking your blood sugar (glucose) after you have not eaten for a while (fasting).  Genital exam to check for testicular cancer or hernias.  STD (sexually transmitted disease) testing, if you are at risk. Talk with your health care provider about your test results, treatment options, and if necessary, the need for more tests.   Follow these instructions at home: Eating and drinking  Eat a healthy diet that includes fresh fruits and vegetables, whole grains, lean protein, and low-fat dairy products.  Drink enough fluid to keep your urine pale yellow.  Take vitamin and mineral supplements as recommended by your health care provider.  Do not drink alcohol if your health care provider tells you not to drink.  If you drink alcohol: ? Limit how much you have to 0-2 drinks a day. ? Be aware of how much alcohol is in your drink. In the [place] one drink equals one 12 oz bottle of beer (355 mL), one 5  oz glass of wine (148 mL), or one 1 oz glass of hard liquor (44 mL).   Lifestyle  Take daily care of your teeth and gums. Brush your teeth every morning and night with fluoride toothpaste. Floss one time each day.  Stay active. Exercise for at least 30 minutes 5 or more days each week.  Do not use any products that contain nicotine or tobacco, such as cigarettes, e-cigarettes, and chewing tobacco. If you need help quitting, ask your health care provider.  Do not use drugs.  If you are sexually active, practice safe sex. Use a condom  or other form of protection to prevent STIs (sexually transmitted infections).  Find healthy ways to cope with stress, such as: ? Meditation, yoga, or listening to music. ? Journaling. ? Talking to a trusted person. ? Spending time with friends and family. Safety  Always wear your seat belt while driving or riding in a vehicle.  Do not drive: ? If you have been drinking alcohol. Do not ride with someone who has been drinking. ? When you are tired or distracted. ? While texting.  Wear a helmet and other protective equipment during sports activities.  If you have firearms in your house, make sure you follow all gun safety procedures.  Seek help if you have been physically or sexually abused. What's next?  Go to your health care provider once a year for an annual wellness visit.  Ask your health care provider how often you should have your eyes and teeth checked.  Stay up to date on all vaccines. This information is not intended to replace advice given to you by your health care provider. Make sure you discuss any questions you have with your health care provider. Document Revised: 03/13/2019 Document Reviewed: 06/21/2018 Elsevier Patient Education  2021 [employer].

## 2020-09-11 ENCOUNTER — Encounter: Payer: 59 | Admitting: Family Medicine

## 2020-09-11 ENCOUNTER — Ambulatory Visit: Payer: 59 | Admitting: Family Medicine

## 2020-09-28 ENCOUNTER — Other Ambulatory Visit (INDEPENDENT_AMBULATORY_CARE_PROVIDER_SITE_OTHER): Payer: Self-pay | Admitting: Family Medicine

## 2020-09-28 MED ORDER — WEGOVY 1.7 MG/0.75ML ~~LOC~~ SOAJ: 1.7000 mg | SUBCUTANEOUS | 0 refills | Status: DC

## 2020-09-29 ENCOUNTER — Other Ambulatory Visit (INDEPENDENT_AMBULATORY_CARE_PROVIDER_SITE_OTHER): Payer: Self-pay

## 2020-09-29 ENCOUNTER — Other Ambulatory Visit (INDEPENDENT_AMBULATORY_CARE_PROVIDER_SITE_OTHER): Payer: Self-pay | Admitting: Family Medicine

## 2020-09-29 DIAGNOSIS — F419 Anxiety disorder, unspecified: Secondary | ICD-10-CM

## 2020-09-29 MED ORDER — ESCITALOPRAM OXALATE 10 MG PO TABS: 10.0000 mg | ORAL_TABLET | Freq: Every day | ORAL | 0 refills | Status: DC

## 2020-09-29 MED ORDER — PHENTERMINE HCL 37.5 MG PO TABS: 37.5000 mg | ORAL_TABLET | Freq: Every day | ORAL | 0 refills | Status: DC

## 2020-10-05 ENCOUNTER — Ambulatory Visit (INDEPENDENT_AMBULATORY_CARE_PROVIDER_SITE_OTHER): Payer: 59 | Admitting: Family Medicine

## 2020-10-05 ENCOUNTER — Other Ambulatory Visit: Payer: Self-pay

## 2020-10-05 VITALS — BP 110/66 | HR 73 | Ht 70.0 in | Wt 188.0 lb

## 2020-10-05 DIAGNOSIS — M533 Sacrococcygeal disorders, not elsewhere classified: Secondary | ICD-10-CM | POA: Diagnosis not present

## 2020-10-05 DIAGNOSIS — R632 Polyphagia: Secondary | ICD-10-CM | POA: Diagnosis not present

## 2020-10-05 DIAGNOSIS — Z6838 Body mass index (BMI) 38.0-38.9, adult: Secondary | ICD-10-CM | POA: Diagnosis not present

## 2020-10-05 DIAGNOSIS — Z9189 Other specified personal risk factors, not elsewhere classified: Secondary | ICD-10-CM | POA: Diagnosis not present

## 2020-10-05 DIAGNOSIS — R1013 Epigastric pain: Secondary | ICD-10-CM | POA: Diagnosis not present

## 2020-10-05 DIAGNOSIS — G4709 Other insomnia: Secondary | ICD-10-CM | POA: Diagnosis not present

## 2020-10-05 DIAGNOSIS — F3289 Other specified depressive episodes: Secondary | ICD-10-CM

## 2020-10-08 ENCOUNTER — Other Ambulatory Visit (INDEPENDENT_AMBULATORY_CARE_PROVIDER_SITE_OTHER): Payer: Self-pay

## 2020-10-08 ENCOUNTER — Other Ambulatory Visit (INDEPENDENT_AMBULATORY_CARE_PROVIDER_SITE_OTHER): Payer: Self-pay | Admitting: Family Medicine

## 2020-10-08 MED ORDER — WEGOVY 1 MG/0.5ML ~~LOC~~ SOAJ: 1.0000 mg | SUBCUTANEOUS | 0 refills | Status: DC

## 2020-10-08 NOTE — Progress Notes (Signed)
[facility] [address] [place] [address] Phone: [phone] Subjective:   Kent Griffith, am serving as a scribe for Dr. Antoine Primas. This visit occurred during the SARS-CoV-2 public health emergency.  Safety protocols were in place, including screening questions prior to the visit, additional usage of staff PPE, and extensive cleaning of exam room while observing appropriate contact time as indicated for disinfecting solutions.   I'm seeing this patient by the request  of:  Kent Dark, MD  CC: Low back and hip pain  [id number]  Kent Griffith is a 25 y.o. male coming in with complaint of low back pain. Patient states that his pain is anterior and feels deep in hip joint. Patient notes being heavier at some point and pain was worse then. Has popping that causes loss of strength. Also has lower back pain that radiates up into shoulder blades. Pain is worse when he squats down.   Patient has lost over 100 pounds.  Past Medical History:  Diagnosis Date  . Anxiety   . Depression   . GERD (gastroesophageal reflux disease)   . Hypothyroidism   . IBS (irritable bowel syndrome)   . Joint pain   . Lactose intolerance   . Obesity   . Sleep apnea    Past Surgical History:  Procedure Laterality Date  . KNEE ARTHROSCOPY, MEDIAL PATELLO FEMORAL LIGAMENT REPAIR Right   . TONSILLECTOMY AND ADENOIDECTOMY     Social History   Socioeconomic History  . Marital status: Married    Spouse name: Kent Griffith   . Number of children: 1  . Years of education: Not on file  . Highest education level: Not on file  Occupational History  . Not on file  Tobacco Use  . Smoking status: Never Smoker  . Smokeless tobacco: Never Used  Vaping Use  . Vaping Use: Never used  Substance and Sexual Activity  . Alcohol use: Yes    Comment: occ.  . Drug use: Not on file  . Sexual activity: Not on file  Other Topics Concern  . Not on file  Social History Narrative   . Not on file   Social Determinants of Health   Financial Resource Strain: Not on file  Food Insecurity: Not on file  Transportation Needs: Not on file  Physical Activity: Not on file  Stress: Not on file  Social Connections: Not on file   Allergies  Allergen Reactions  . Penicillins Anaphylaxis, Hives, Itching and Rash  . Vyvanse [Lisdexamfetamine Dimesylate] Other (See Comments)    Tics   Family History  Problem Relation Age of Onset  . Depression Mother   . Suicidality Mother   . Anxiety disorder Mother   . Bipolar disorder Mother   . Drug abuse Mother   . Obesity Mother   . Alcohol abuse Father   . Anxiety disorder Father   . Depression Father   . Bipolar disorder Father   . Schizophrenia Father   . Liver disease Father   . Drug abuse Father   . Colon polyps Paternal Grandmother   . Esophageal cancer Neg Hx   . Pancreatic cancer Neg Hx   . Stomach cancer Neg Hx        Current Outpatient Medications (Analgesics):  .  meloxicam (MOBIC) 15 MG tablet, Take 1 tablet (15 mg total) by mouth daily.   Current Outpatient Medications (Other):  Marland Kitchen  B Complex-C (B-COMPLEX WITH VITAMIN C) tablet, Take 1 tablet by  mouth daily. Marland Kitchen  buPROPion (WELLBUTRIN XL) 300 MG 24 hr tablet, Take 1 tablet (300 mg total) by mouth daily. .  Cholecalciferol (VITAMIN D) 50 MCG (2000 UT) CAPS, Take by mouth. .  escitalopram (LEXAPRO) 10 MG tablet, Take 1 tablet (10 mg total) by mouth daily. Marland Kitchen  linaclotide (LINZESS) 145 MCG CAPS capsule, Take 1 capsule (145 mcg total) by mouth daily as needed. .  Multiple Vitamins-Minerals (MULTIVITAMIN WITH MINERALS) tablet, Take 1 tablet by mouth daily. Marland Kitchen  omeprazole (PRILOSEC) 20 MG capsule, Take 1 capsule (20 mg total) by mouth daily. .  phentermine (ADIPEX-P) 37.5 MG tablet, Take 1 tablet (37.5 mg total) by mouth daily before breakfast. .  Semaglutide-Weight Management (WEGOVY) 1 MG/0.5ML SOAJ, Inject 1 mg into the skin once a week. .  Semaglutide-Weight  Management (WEGOVY) 1.7 MG/0.75ML SOAJ, Inject 1.7 mg into the skin once a week. .  traZODone (DESYREL) 50 MG tablet, Take 1-2 tablets (50-100 mg total) by mouth at bedtime.   Reviewed prior external information including notes and imaging from  primary care provider As well as notes that were available from care everywhere and other healthcare systems.  Past medical history, social, surgical and family history all reviewed in electronic medical record.  No pertanent information unless stated regarding to the chief complaint.   Review of Systems:  No headache, visual changes, nausea, vomiting, diarrhea, constipation, dizziness, abdominal pain, skin rash, fevers, chills, night sweats, weight loss, swollen lymph nodes, body aches, joint swelling, chest pain, shortness of breath, mood changes. POSITIVE muscle aches  Objective  Blood pressure 110/72, pulse 97, height 5\' 10"  (1.778 m), weight 192 lb (87.1 kg), SpO2 98 %.   General: No apparent distress alert and oriented x3 mood and affect normal, dressed appropriately.  HEENT: Pupils equal, extraocular movements intact  Respiratory: Patient's speak in full sentences and does not appear short of breath  Cardiovascular: No lower extremity edema, non tender, no erythema  Gait normal with good balance and coordination.  MSK: Patient low back does have some mild loss of lordosis.  Patient has lost a lot of strength and seems in the gluteal area and does have weakness noted with hip abductor strengthening.  Significant tightness noted of the hip flexors as well as the hamstrings bilaterally left greater than right.  Negative straight leg test.  Tenderness noted over the right sacroiliac joint.  Negative Faber.  Osteopathic findings C4 flexed rotated and side bent right T4 extended rotated segment left T9 extended rotated and side bent right L2 flexed rotated and side bent right Sacrum right on right   Impression and Recommendations:     The  above documentation has been reviewed and is accurate and complete , DO

## 2020-10-09 ENCOUNTER — Other Ambulatory Visit: Payer: Self-pay

## 2020-10-09 ENCOUNTER — Encounter: Payer: Self-pay | Admitting: Family Medicine

## 2020-10-09 ENCOUNTER — Other Ambulatory Visit: Payer: Self-pay | Admitting: Family Medicine

## 2020-10-09 ENCOUNTER — Ambulatory Visit (INDEPENDENT_AMBULATORY_CARE_PROVIDER_SITE_OTHER): Payer: 59 | Admitting: Family Medicine

## 2020-10-09 ENCOUNTER — Ambulatory Visit (INDEPENDENT_AMBULATORY_CARE_PROVIDER_SITE_OTHER): Payer: 59

## 2020-10-09 VITALS — BP 110/72 | HR 97 | Ht 70.0 in | Wt 192.0 lb

## 2020-10-09 DIAGNOSIS — M25552 Pain in left hip: Secondary | ICD-10-CM | POA: Diagnosis not present

## 2020-10-09 DIAGNOSIS — M24559 Contracture, unspecified hip: Secondary | ICD-10-CM | POA: Diagnosis not present

## 2020-10-09 DIAGNOSIS — M545 Low back pain, unspecified: Secondary | ICD-10-CM

## 2020-10-09 DIAGNOSIS — M999 Biomechanical lesion, unspecified: Secondary | ICD-10-CM | POA: Diagnosis not present

## 2020-10-09 MED ORDER — MELOXICAM 15 MG PO TABS: 15.0000 mg | ORAL_TABLET | Freq: Every day | ORAL | 0 refills | Status: DC

## 2020-10-09 NOTE — Patient Instructions (Signed)
Xray today Hip flexor  Meloxicam 15mg   Do not use NSAIDS such as Advil or Aleve when taking Meloxicam It is ok to use Tylenol for additional pain relief Ice 20x  Exercises  See me in 4-6 weeks

## 2020-10-09 NOTE — Assessment & Plan Note (Signed)
   Decision today to treat with OMT was based on Physical Exam  After verbal consent patient was treated with HVLA, ME, echniques in cervical, thoracic,  lumbar and sacral areas, all areas are chronic   Patient tolerated the procedure well with improvement in symptoms  Patient given exercises, stretches and lifestyle modifications  See medications in patient instructions if given  Patient will follow up in 4-8 weeks

## 2020-10-09 NOTE — Assessment & Plan Note (Signed)
Bilaterally.  Discussed which activities to do which wants to avoid.  Increase activity slowly. Discussed core strength, xrays pending due to popping.  Encouraged strengthening.  Meloxicam  RTC in 4-6 weeks

## 2020-10-11 ENCOUNTER — Other Ambulatory Visit (HOSPITAL_COMMUNITY): Payer: Self-pay

## 2020-10-11 MED FILL — Semaglutide (Weight Mngmt) Soln Auto-Injector 1 MG/0.5ML: SUBCUTANEOUS | 28 days supply | Qty: 2 | Fill #0 | Status: CN

## 2020-10-12 ENCOUNTER — Other Ambulatory Visit (INDEPENDENT_AMBULATORY_CARE_PROVIDER_SITE_OTHER): Payer: Self-pay

## 2020-10-12 ENCOUNTER — Other Ambulatory Visit (HOSPITAL_COMMUNITY): Payer: Self-pay

## 2020-10-12 MED ORDER — WEGOVY 0.5 MG/0.5ML ~~LOC~~ SOAJ
0.5000 mg | SUBCUTANEOUS | 0 refills | Status: DC
  Filled 2020-10-12: qty 2, 28d supply, fill #0

## 2020-10-13 ENCOUNTER — Other Ambulatory Visit (HOSPITAL_COMMUNITY): Payer: Self-pay

## 2020-10-13 DIAGNOSIS — R1013 Epigastric pain: Secondary | ICD-10-CM | POA: Diagnosis not present

## 2020-10-14 ENCOUNTER — Encounter (INDEPENDENT_AMBULATORY_CARE_PROVIDER_SITE_OTHER): Payer: Self-pay | Admitting: Family Medicine

## 2020-10-14 LAB — CBC WITH DIFFERENTIAL/PLATELET
Basophils Absolute: 0 10*3/uL (ref 0.0–0.2)
Basos: 0 %
EOS (ABSOLUTE): 0.5 10*3/uL — ABNORMAL HIGH (ref 0.0–0.4)
Eos: 9 %
Hematocrit: 39.4 % (ref 37.5–51.0)
Hemoglobin: 13.3 g/dL (ref 13.0–17.7)
Immature Grans (Abs): 0 10*3/uL (ref 0.0–0.1)
Immature Granulocytes: 0 %
Lymphocytes Absolute: 1.2 10*3/uL (ref 0.7–3.1)
Lymphs: 22 %
MCH: 28.7 pg (ref 26.6–33.0)
MCHC: 33.8 g/dL (ref 31.5–35.7)
MCV: 85 fL (ref 79–97)
Monocytes Absolute: 0.5 10*3/uL (ref 0.1–0.9)
Monocytes: 9 %
Neutrophils Absolute: 3.4 10*3/uL (ref 1.4–7.0)
Neutrophils: 60 %
Platelets: 154 10*3/uL (ref 150–450)
RBC: 4.64 x10E6/uL (ref 4.14–5.80)
RDW: 13.1 % (ref 11.6–15.4)
WBC: 5.7 10*3/uL (ref 3.4–10.8)

## 2020-10-14 LAB — COMPREHENSIVE METABOLIC PANEL
ALT: 19 IU/L (ref 0–44)
AST: 21 IU/L (ref 0–40)
Albumin/Globulin Ratio: 2.2 (ref 1.2–2.2)
Albumin: 4.3 g/dL (ref 4.1–5.2)
Alkaline Phosphatase: 75 IU/L (ref 44–121)
BUN/Creatinine Ratio: 14 (ref 9–20)
BUN: 13 mg/dL (ref 6–20)
Bilirubin Total: 0.4 mg/dL (ref 0.0–1.2)
CO2: 26 mmol/L (ref 20–29)
Calcium: 8.8 mg/dL (ref 8.7–10.2)
Chloride: 102 mmol/L (ref 96–106)
Creatinine, Ser: 0.92 mg/dL (ref 0.76–1.27)
Globulin, Total: 2 g/dL (ref 1.5–4.5)
Glucose: 79 mg/dL (ref 65–99)
Potassium: 3.8 mmol/L (ref 3.5–5.2)
Sodium: 141 mmol/L (ref 134–144)
Total Protein: 6.3 g/dL (ref 6.0–8.5)
eGFR: 118 mL/min/{1.73_m2} (ref >59)

## 2020-10-14 LAB — LIPASE: Lipase: 35 U/L (ref 13–78)

## 2020-10-14 NOTE — Progress Notes (Signed)
Chief Complaint:   OBESITY Kent Griffith is here to discuss his progress with his obesity treatment plan along with follow-up of his obesity related diagnoses.   Today's visit was #: 21 Starting weight: 270 lbs Starting date: 04/27/2019 Today's weight: 188 lbs Today's date: 10/05/2020 Total lbs lost to date: 82 lbs Body mass index is 26.98 kg/m.  Total weight loss percentage to date: -30.37%  Interim History:  Kent Griffith started [facility] last Wednesday.  No side effects.  He endorses less cravings.  No decreased appetite, but is feeling full more quickly. Current Meal Plan: practicing portion control and making smarter food choices, such as increasing vegetables and decreasing simple carbohydrates for 100% of the time.  Current Exercise Plan: None. Current Anti-Obesity Medications: Wegovy, metformin, phentermine.  Side effects: None.  Assessment/Plan:   1. Polyphagia, with IR Controlled. Current treatment: Wegovy, phentermine, and metformin. Polyphagia refers to excessive feelings of hunger. He will continue to focus on protein-rich, low simple carbohydrate foods. We reviewed the importance of hydration, regular exercise for stress reduction, and restorative sleep.  Plan:  He will continue Wegovy and phentermine.  Stopping metformin in a few days.  2. Coccydynia Ongoing tailbone pain, improving with chair cushion and better posture.  3. Other insomnia This is well controlled with trazodone. Dysfunction:  Difficulty sleeping due to neck stiffness and back pain.  Current treatment: trazodone 50-100 mg at bedtime as needed for sleep.  Plan: Recommend sleep hygiene measures including regular sleep schedule, optimal sleep environment, and relaxing presleep rituals.   4. Epigastric pain Kent Griffith takes omeprazole 20 mg daily.  Plan:  Continue omeprazole.  Will check labs today, as per below.  - CBC with Differential/Platelet - Comprehensive metabolic panel - Lipase  5. Other depression, with  emotional eating Controlled. Medication: Wellbutrin XL 300 mg daily.  Plan:  Behavior modification techniques were discussed today to help deal with emotional/non-hunger eating behaviors.  6. At risk for deficient intake of food Kent Griffith was given extensive education and counseling today of more than 8 minutes on risks associated with deficient food intake.  Counseled him on the importance of following our prescribed meal plan and eating adequate amounts of protein.  Discussed with Kent Griffith that inadequate food intake over longer periods of time can slow their metabolism down significantly.   7. Obesity, current BMI 27.1  Course: Kent Griffith is currently in the action stage of change. As such, his goal is to continue with weight loss efforts.   Nutrition goals: He has agreed to practicing portion control and making smarter food choices, such as increasing vegetables and decreasing simple carbohydrates.   Exercise goals: Strength training.  Behavioral modification strategies: increasing lean protein intake, decreasing simple carbohydrates, increasing vegetables and increasing water intake.  Kent Griffith has agreed to follow-up with our clinic in 4 weeks. He was informed of the importance of frequent follow-up visits to maximize his success with intensive lifestyle modifications for his multiple health conditions.   Objective:   Blood pressure 110/66, pulse 73, height 5\' 10"  (1.778 m), weight 188 lb (85.3 kg), SpO2 100 %. Body mass index is 26.98 kg/m.  General: Cooperative, alert, well developed, in no acute distress. HEENT: Conjunctivae and lids unremarkable. Cardiovascular: Regular rhythm.  Lungs: Normal work of breathing. Neurologic: No focal deficits.   Lab Results  Component Value Date   CREATININE 0.92 10/13/2020   BUN 13 10/13/2020   NA 141 10/13/2020   K 3.8 10/13/2020   CL 102 10/13/2020   CO2  26 10/13/2020   Lab Results  Component Value Date   ALT 19 10/13/2020   AST 21 10/13/2020    ALKPHOS 75 10/13/2020   BILITOT 0.4 10/13/2020   Lab Results  Component Value Date   HGBA1C 4.8 05/19/2020   HGBA1C 4.9 05/07/2019   HGBA1C 5.0 07/12/2018   Lab Results  Component Value Date   INSULIN 4.5 05/19/2020   INSULIN 12.3 05/07/2019   Lab Results  Component Value Date   TSH 4.150 12/11/2019   Lab Results  Component Value Date   CHOL 125 05/19/2020   HDL 45 05/19/2020   LDLCALC 68 05/19/2020   LDLDIRECT 99.0 07/12/2018   TRIG 55 05/19/2020   CHOLHDL 3 07/24/2019   Lab Results  Component Value Date   WBC 5.7 10/13/2020   HGB 13.3 10/13/2020   HCT 39.4 10/13/2020   MCV 85 10/13/2020   PLT 154 10/13/2020   Lab Results  Component Value Date   IRON 93 12/11/2019   TIBC 261 12/11/2019   FERRITIN 78 12/11/2019   Attestation Statements:   Reviewed by clinician on day of visit: allergies, medications, problem list, medical history, surgical history, family history, social history, and previous encounter notes.  I, Insurance claims handler, CMA, am acting as transcriptionist for Helane Rima, DO  I have reviewed the above documentation for accuracy and completeness, and I agree with the above. Helane Rima, DO

## 2020-11-08 ENCOUNTER — Other Ambulatory Visit: Payer: Self-pay | Admitting: Family Medicine

## 2020-11-08 MED ORDER — MELOXICAM 15 MG PO TABS
ORAL_TABLET | Freq: Every day | ORAL | 0 refills | Status: DC
  Filled 2020-11-08: qty 30, 30d supply, fill #0

## 2020-11-09 ENCOUNTER — Other Ambulatory Visit (HOSPITAL_COMMUNITY): Payer: Self-pay

## 2020-11-09 ENCOUNTER — Other Ambulatory Visit (INDEPENDENT_AMBULATORY_CARE_PROVIDER_SITE_OTHER): Payer: Self-pay | Admitting: Family Medicine

## 2020-11-09 DIAGNOSIS — F419 Anxiety disorder, unspecified: Secondary | ICD-10-CM

## 2020-11-09 MED ORDER — PHENTERMINE HCL 37.5 MG PO TABS
ORAL_TABLET | Freq: Every day | ORAL | 0 refills | Status: DC
  Filled 2020-11-09: qty 30, 30d supply, fill #0

## 2020-11-09 MED ORDER — SAXENDA 18 MG/3ML ~~LOC~~ SOPN
3.0000 mg | PEN_INJECTOR | Freq: Every day | SUBCUTANEOUS | 6 refills | Status: DC
  Filled 2020-11-09: qty 15, 30d supply, fill #0

## 2020-11-09 NOTE — Progress Notes (Signed)
Medication changes today.

## 2020-11-12 NOTE — Progress Notes (Signed)
[facility] [address] [place] [address] Phone: [phone] Subjective:   Kent Griffith, am serving as a scribe for Dr. Antoine Primas. This visit occurred during the SARS-CoV-2 public health emergency.  Safety protocols were in place, including screening questions prior to the visit, additional usage of staff PPE, and extensive cleaning of exam room while observing appropriate contact time as indicated for disinfecting solutions.   I'm seeing this patient by the request  of:  Ardith Dark, MD  CC: Low back pain follow-up  [id number]  Kent Griffith is a 25 y.o. male coming in with complaint of back and neck pain. OMT 10/09/2020. Patient states that he has less pain. Using Meloxicam and Tylenol on bad days.  Patient states that overall he has not been doing the exercises faithfully.  Patient states that it is still about the same when he has a bad days.  No new symptoms.  Medications patient has been prescribed: Meloxicam  Taking:         Reviewed prior external information including notes and imaging from previsou exam, outside providers and external EMR if available.   As well as notes that were available from care everywhere and other healthcare systems.  Past medical history, social, surgical and family history all reviewed in electronic medical record.  No pertanent information unless stated regarding to the chief complaint.   Past Medical History:  Diagnosis Date  . Anxiety   . Depression   . GERD (gastroesophageal reflux disease)   . Hypothyroidism   . IBS (irritable bowel syndrome)   . Joint pain   . Lactose intolerance   . Obesity   . Sleep apnea     Allergies  Allergen Reactions  . Penicillins Anaphylaxis, Hives, Itching and Rash  . Vyvanse [Lisdexamfetamine Dimesylate] Other (See Comments)    Tics     Review of Systems:  No headache, visual changes, nausea, vomiting, diarrhea, constipation, dizziness, abdominal  pain, skin rash, fevers, chills, night sweats, weight loss, swollen lymph nodes, body aches, joint swelling, chest pain, shortness of breath, mood changes. POSITIVE muscle aches  Objective  Blood pressure 112/84, pulse 71, height 5\' 10"  (1.778 m), weight 193 lb (87.5 kg), SpO2 99 %.   General: No apparent distress alert and oriented x3 mood and affect normal, dressed appropriately.  HEENT: Pupils equal, extraocular movements intact  Respiratory: Patient's speak in full sentences and does not appear short of breath  Cardiovascular: No lower extremity edema, non tender, no erythema  Hypermobility noted to multiple joints. Patient is low back does have tenderness to palpation over the sacroiliac joint but continues to have more of the hip flexor tightness on the right side. Patient also has a left-sided parascapular tightness noted.  Osteopathic findings  C6 flexed rotated and side bent left T4 extended rotated and side bent left inhaled rib L2 flexed rotated and side bent right Sacrum right on right       Assessment and Plan:  Hip flexor tightness Patient is making very minimal improvement but patient has not been doing the exercises regularly at this time.  Patient did respond extremely well though also to the osteopathic manipulation again today.  Encouraged him to do the exercises especially the hip abductor strengthening and stretching of the hip flexors.  Patient was found to have some more scapular dyskinesis and we discussed different exercises of that today as well.  Patient will increase activity slowly and follow-up with me again 6  weeks    Nonallopathic problems  Decision today to treat with OMT was based on Physical Exam  After verbal consent patient was treated with HVLA, ME, FPR techniques in cervical, rib, thoracic, lumbar, and sacral  areas  Patient tolerated the procedure well with improvement in symptoms  Patient given exercises, stretches and lifestyle  modifications  See medications in patient instructions if given  Patient will follow up in 4-8 weeks      The above documentation has been reviewed and is accurate and complete Judi Saa, DO       Note: This dictation was prepared with Dragon dictation along with smaller phrase technology. Any transcriptional errors that result from this process are unintentional.

## 2020-11-13 ENCOUNTER — Other Ambulatory Visit: Payer: Self-pay

## 2020-11-13 ENCOUNTER — Ambulatory Visit: Payer: 59 | Admitting: Family Medicine

## 2020-11-13 ENCOUNTER — Encounter: Payer: Self-pay | Admitting: Family Medicine

## 2020-11-13 VITALS — BP 112/84 | HR 71 | Ht 70.0 in | Wt 193.0 lb

## 2020-11-13 DIAGNOSIS — M9904 Segmental and somatic dysfunction of sacral region: Secondary | ICD-10-CM

## 2020-11-13 DIAGNOSIS — M9902 Segmental and somatic dysfunction of thoracic region: Secondary | ICD-10-CM | POA: Diagnosis not present

## 2020-11-13 DIAGNOSIS — M9901 Segmental and somatic dysfunction of cervical region: Secondary | ICD-10-CM | POA: Diagnosis not present

## 2020-11-13 DIAGNOSIS — M9908 Segmental and somatic dysfunction of rib cage: Secondary | ICD-10-CM | POA: Diagnosis not present

## 2020-11-13 DIAGNOSIS — M9903 Segmental and somatic dysfunction of lumbar region: Secondary | ICD-10-CM

## 2020-11-13 DIAGNOSIS — M24559 Contracture, unspecified hip: Secondary | ICD-10-CM

## 2020-11-13 NOTE — Patient Instructions (Signed)
Good to see you Exercises Try to do ex 2-3 x a week Seeing improvement See me in 6 weeks

## 2020-11-13 NOTE — Assessment & Plan Note (Signed)
Patient is making very minimal improvement but patient has not been doing the exercises regularly at this time.  Patient did respond extremely well though also to the osteopathic manipulation again today.  Encouraged him to do the exercises especially the hip abductor strengthening and stretching of the hip flexors.  Patient was found to have some more scapular dyskinesis and we discussed different exercises of that today as well.  Patient will increase activity slowly and follow-up with me again 6 weeks

## 2020-11-16 ENCOUNTER — Ambulatory Visit (INDEPENDENT_AMBULATORY_CARE_PROVIDER_SITE_OTHER): Payer: 59 | Admitting: Family Medicine

## 2020-11-16 ENCOUNTER — Encounter (INDEPENDENT_AMBULATORY_CARE_PROVIDER_SITE_OTHER): Payer: Self-pay | Admitting: Family Medicine

## 2020-11-16 ENCOUNTER — Other Ambulatory Visit: Payer: Self-pay

## 2020-11-16 VITALS — BP 95/56 | HR 73 | Temp 98.0°F | Ht 70.0 in | Wt 188.0 lb

## 2020-11-16 DIAGNOSIS — F419 Anxiety disorder, unspecified: Secondary | ICD-10-CM | POA: Diagnosis not present

## 2020-11-16 DIAGNOSIS — R632 Polyphagia: Secondary | ICD-10-CM | POA: Diagnosis not present

## 2020-11-16 DIAGNOSIS — Z9189 Other specified personal risk factors, not elsewhere classified: Secondary | ICD-10-CM | POA: Diagnosis not present

## 2020-11-16 DIAGNOSIS — Z6838 Body mass index (BMI) 38.0-38.9, adult: Secondary | ICD-10-CM | POA: Diagnosis not present

## 2020-11-16 DIAGNOSIS — K219 Gastro-esophageal reflux disease without esophagitis: Secondary | ICD-10-CM

## 2020-11-17 ENCOUNTER — Other Ambulatory Visit (HOSPITAL_COMMUNITY): Payer: Self-pay

## 2020-11-17 MED ORDER — BUPROPION HCL ER (XL) 300 MG PO TB24
ORAL_TABLET | Freq: Every day | ORAL | 0 refills | Status: DC
  Filled 2020-11-17: qty 90, fill #0
  Filled 2020-11-24: qty 90, 90d supply, fill #0

## 2020-11-17 MED ORDER — OMEPRAZOLE 20 MG PO CPDR
DELAYED_RELEASE_CAPSULE | Freq: Every day | ORAL | 0 refills | Status: DC
  Filled 2020-11-17 (×2): qty 90, 90d supply, fill #0

## 2020-11-17 MED ORDER — BD PEN NEEDLE NANO U/F 32G X 4 MM MISC
0 refills | Status: DC
  Filled 2020-11-17 – 2020-11-24 (×3): qty 100, 90d supply, fill #0

## 2020-11-17 MED ORDER — ESCITALOPRAM OXALATE 10 MG PO TABS
ORAL_TABLET | Freq: Every day | ORAL | 0 refills | Status: DC
  Filled 2020-11-17: qty 30, 30d supply, fill #0

## 2020-11-23 NOTE — Progress Notes (Signed)
Chief Complaint:   OBESITY Kent Griffith is here to discuss his progress with his obesity treatment plan along with follow-up of his obesity related diagnoses.   Today's visit was #: 22 Starting weight: 270 lbs Starting date: 04/27/2019 Today's weight: 188 lbs Today's date: 11/16/2020 Weight change since last visit: 0 Total lbs lost to date: 82 lbs Body mass index is 26.98 kg/m.  Total weight loss percentage to date: -30.37%  Interim History:  Kent Griffith is on Saxenda 1.8 mg subcutaneously daily and tolerating it well.  Endorses hyperphagia.  Possible boredom eating.  Upper GI symptoms improved.  Anxiety improving.  He has not needed to take trazodone.  He is taking Linzess once a week on average, he reports.  He says that he wants to lose ~8 more pounds.  Current Meal Plan: practicing portion control and making smarter food choices, such as increasing vegetables and decreasing simple carbohydrates for 100% of the time.  Current Exercise Plan: Increased walking. Current Anti-Obesity Medications: Saxenda 1.8 mg subcutaneously daily. Side effects: None.  Assessment/Plan:   Meds ordered this encounter  Medications  . buPROPion (WELLBUTRIN XL) 300 MG 24 hr tablet    Sig: TAKE 1 TABLET (300 MG TOTAL) BY MOUTH DAILY.    Dispense:  90 tablet    Refill:  0  . escitalopram (LEXAPRO) 10 MG tablet    Sig: TAKE 1 TABLET (10 MG TOTAL) BY MOUTH DAILY.    Dispense:  30 tablet    Refill:  0  . omeprazole (PRILOSEC) 20 MG capsule    Sig: TAKE 1 CAPSULE (20 MG TOTAL) BY MOUTH DAILY.    Dispense:  90 capsule    Refill:  0  . Insulin Pen Needle (BD PEN NEEDLE NANO U/F) 32G X 4 MM MISC    Sig: Use daily with Saxenda injections    Dispense:  100 each    Refill:  0   1. Polyphagia Not at goal. Current treatment: Saxenda 1.8 mg subcutaneously daily. Polyphagia refers to excessive feelings of hunger. He will continue to focus on protein-rich, low simple carbohydrate foods. We reviewed the importance of  hydration, regular exercise for stress reduction, and restorative sleep.  - Refill Insulin Pen Needle (BD PEN NEEDLE NANO U/F) 32G X 4 MM MISC; Use daily with Saxenda injections  Dispense: 100 each; Refill: 0  2. Gastroesophageal reflux disease without esophagitis Kent Griffith is taking Prilosec 20 mg daily for GERD. Plan:  Will refill Prilosec today, as per below.  We reviewed the diagnosis of GERD and importance of treatment. We discussed "red flag" symptoms and the importance of follow up if symptoms persisted despite treatment. We reviewed non-pharmacologic management of GERD symptoms: including: caffeine reduction, dietary changes, elevate HOB, NPO after supper, reduction of alcohol intake, tobacco cessation, and weight loss.  - Refill omeprazole (PRILOSEC) 20 MG capsule; TAKE 1 CAPSULE (20 MG TOTAL) BY MOUTH DAILY.  Dispense: 90 capsule; Refill: 0  3. Anxiety, with emotional eating Improving, but not optimized. Medication: Wellbutrin XL 300 mg daily and Lexapro 10 mg daily.  Plan:  Discussed cues and consequences, how thoughts affect eating, model of thoughts, feelings, and behaviors, and strategies for change by focusing on the cue. Discussed cognitive distortions, coping thoughts, and how to change your thoughts.  - Refill buPROPion (WELLBUTRIN XL) 300 MG 24 hr tablet; TAKE 1 TABLET (300 MG TOTAL) BY MOUTH DAILY.  Dispense: 90 tablet; Refill: 0 - Refill escitalopram (LEXAPRO) 10 MG tablet; TAKE 1 TABLET (10 MG  TOTAL) BY MOUTH DAILY.  Dispense: 30 tablet; Refill: 0  4. At risk for deficient intake of food Kent Griffith was given extensive education and counseling today of more than 8 minutes on risks associated with deficient food intake.  Counseled him on the importance of following our prescribed meal plan and eating adequate amounts of protein.  Discussed with Kent Griffith that inadequate food intake over longer periods of time can slow their metabolism down significantly.   5. Obesity, current BMI  27  Course: Kent Griffith is currently in the action stage of change. As such, his goal is to continue with weight loss efforts.   Nutrition goals: He has agreed to practicing portion control and making smarter food choices, such as increasing vegetables and decreasing simple carbohydrates.   Exercise goals: For substantial health benefits, adults should do at least 150 minutes (2 hours and 30 minutes) a week of moderate-intensity, or 75 minutes (1 hour and 15 minutes) a week of vigorous-intensity aerobic physical activity, or an equivalent combination of moderate- and vigorous-intensity aerobic activity. Aerobic activity should be performed in episodes of at least 10 minutes, and preferably, it should be spread throughout the week.  Behavioral modification strategies: increasing lean protein intake, decreasing simple carbohydrates, increasing vegetables and increasing water intake.  Kent Griffith has agreed to follow-up with our clinic in 4 weeks. He was informed of the importance of frequent follow-up visits to maximize his success with intensive lifestyle modifications for his multiple health conditions.   Objective:   Blood pressure (!) 95/56, pulse 73, temperature 98 F (36.7 C), temperature source Oral, height 5\' 10"  (1.778 m), weight 188 lb (85.3 kg), SpO2 100 %. Body mass index is 26.98 kg/m.  General: Cooperative, alert, well developed, in no acute distress. HEENT: Conjunctivae and lids unremarkable. Cardiovascular: Regular rhythm.  Lungs: Normal work of breathing. Neurologic: No focal deficits.   Lab Results  Component Value Date   CREATININE 0.92 10/13/2020   BUN 13 10/13/2020   NA 141 10/13/2020   K 3.8 10/13/2020   CL 102 10/13/2020   CO2 26 10/13/2020   Lab Results  Component Value Date   ALT 19 10/13/2020   AST 21 10/13/2020   ALKPHOS 75 10/13/2020   BILITOT 0.4 10/13/2020   Lab Results  Component Value Date   HGBA1C 4.8 05/19/2020   HGBA1C 4.9 05/07/2019   HGBA1C 5.0  07/12/2018   Lab Results  Component Value Date   INSULIN 4.5 05/19/2020   INSULIN 12.3 05/07/2019   Lab Results  Component Value Date   TSH 4.150 12/11/2019   Lab Results  Component Value Date   CHOL 125 05/19/2020   HDL 45 05/19/2020   LDLCALC 68 05/19/2020   LDLDIRECT 99.0 07/12/2018   TRIG 55 05/19/2020   CHOLHDL 3 07/24/2019   Lab Results  Component Value Date   WBC 5.7 10/13/2020   HGB 13.3 10/13/2020   HCT 39.4 10/13/2020   MCV 85 10/13/2020   PLT 154 10/13/2020   Lab Results  Component Value Date   IRON 93 12/11/2019   TIBC 261 12/11/2019   FERRITIN 78 12/11/2019   Attestation Statements:   Reviewed by clinician on day of visit: allergies, medications, problem list, medical history, surgical history, family history, social history, and previous encounter notes.  I, 02/10/2020, CMA, am acting as transcriptionist for Insurance claims handler, DO  I have reviewed the above documentation for accuracy and completeness, and I agree with the above. Helane Rima, DO

## 2020-11-24 ENCOUNTER — Other Ambulatory Visit (HOSPITAL_COMMUNITY): Payer: Self-pay

## 2020-11-25 ENCOUNTER — Other Ambulatory Visit (HOSPITAL_BASED_OUTPATIENT_CLINIC_OR_DEPARTMENT_OTHER): Payer: Self-pay

## 2020-11-30 ENCOUNTER — Other Ambulatory Visit (INDEPENDENT_AMBULATORY_CARE_PROVIDER_SITE_OTHER): Payer: Self-pay | Admitting: Family Medicine

## 2020-11-30 ENCOUNTER — Other Ambulatory Visit (HOSPITAL_COMMUNITY): Payer: Self-pay

## 2020-11-30 MED ORDER — TOPIRAMATE 25 MG PO TABS
25.0000 mg | ORAL_TABLET | Freq: Two times a day (BID) | ORAL | 0 refills | Status: DC
  Filled 2020-11-30: qty 60, 30d supply, fill #0

## 2020-12-08 ENCOUNTER — Other Ambulatory Visit: Payer: Self-pay | Admitting: Family Medicine

## 2020-12-08 ENCOUNTER — Other Ambulatory Visit (INDEPENDENT_AMBULATORY_CARE_PROVIDER_SITE_OTHER): Payer: Self-pay

## 2020-12-08 ENCOUNTER — Other Ambulatory Visit (HOSPITAL_COMMUNITY): Payer: Self-pay

## 2020-12-08 DIAGNOSIS — F419 Anxiety disorder, unspecified: Secondary | ICD-10-CM

## 2020-12-08 MED ORDER — DOXYCYCLINE HYCLATE 100 MG PO TABS
100.0000 mg | ORAL_TABLET | Freq: Two times a day (BID) | ORAL | 0 refills | Status: AC
  Filled 2020-12-08: qty 20, 10d supply, fill #0

## 2020-12-08 MED ORDER — MUPIROCIN 2 % EX OINT
1.0000 "application " | TOPICAL_OINTMENT | Freq: Two times a day (BID) | CUTANEOUS | 0 refills | Status: DC
  Filled 2020-12-08: qty 22, 11d supply, fill #0

## 2020-12-08 MED ORDER — PHENTERMINE HCL 37.5 MG PO TABS
ORAL_TABLET | Freq: Every day | ORAL | 0 refills | Status: DC
  Filled 2020-12-08: qty 30, 30d supply, fill #0

## 2020-12-08 MED ORDER — MELOXICAM 15 MG PO TABS
ORAL_TABLET | Freq: Every day | ORAL | 0 refills | Status: DC
  Filled 2020-12-08: qty 30, 30d supply, fill #0

## 2020-12-14 ENCOUNTER — Ambulatory Visit (INDEPENDENT_AMBULATORY_CARE_PROVIDER_SITE_OTHER): Payer: 59 | Admitting: Family Medicine

## 2020-12-14 ENCOUNTER — Other Ambulatory Visit: Payer: Self-pay

## 2020-12-14 VITALS — BP 109/55 | HR 77 | Temp 98.1°F | Ht 70.0 in | Wt 192.0 lb

## 2020-12-14 DIAGNOSIS — F419 Anxiety disorder, unspecified: Secondary | ICD-10-CM

## 2020-12-14 DIAGNOSIS — E559 Vitamin D deficiency, unspecified: Secondary | ICD-10-CM

## 2020-12-14 DIAGNOSIS — Z9189 Other specified personal risk factors, not elsewhere classified: Secondary | ICD-10-CM

## 2020-12-14 DIAGNOSIS — R632 Polyphagia: Secondary | ICD-10-CM | POA: Diagnosis not present

## 2020-12-14 DIAGNOSIS — Z6838 Body mass index (BMI) 38.0-38.9, adult: Secondary | ICD-10-CM

## 2020-12-15 ENCOUNTER — Other Ambulatory Visit (HOSPITAL_COMMUNITY): Payer: Self-pay

## 2020-12-15 ENCOUNTER — Encounter (INDEPENDENT_AMBULATORY_CARE_PROVIDER_SITE_OTHER): Payer: Self-pay | Admitting: Family Medicine

## 2020-12-15 MED ORDER — ESCITALOPRAM OXALATE 10 MG PO TABS
ORAL_TABLET | Freq: Every day | ORAL | 0 refills | Status: DC
  Filled 2020-12-15: qty 30, 30d supply, fill #0

## 2020-12-16 ENCOUNTER — Other Ambulatory Visit (HOSPITAL_COMMUNITY): Payer: Self-pay

## 2020-12-16 MED ORDER — SAXENDA 18 MG/3ML ~~LOC~~ SOPN
3.0000 mg | PEN_INJECTOR | Freq: Every day | SUBCUTANEOUS | 6 refills | Status: DC
  Filled 2020-12-16: qty 15, 30d supply, fill #0

## 2020-12-22 ENCOUNTER — Other Ambulatory Visit (INDEPENDENT_AMBULATORY_CARE_PROVIDER_SITE_OTHER): Payer: Self-pay

## 2020-12-22 MED ORDER — UNABLE TO FIND: 2.5000 mg | 0 refills | Status: DC

## 2020-12-22 NOTE — Progress Notes (Signed)
Chief Complaint:   OBESITY Kent Griffith is here to discuss his progress with his obesity treatment plan along with follow-up of his obesity related diagnoses.   Today's visit was #: 23 Starting weight: 270 lbs Starting date: 04/27/2019 Today's weight: 192 lbs Today's date: 12/14/2020 Weight change since last visit: +4 lbs Total lbs lost to date: 78 lbs Body mass index is 27.55 kg/m.  Total weight loss percentage to date: -28.89%  Interim History: Kent Griffith says he is worried about the possibility of weight gain. Current Meal Plan: practicing portion control and making smarter food choices, such as increasing vegetables and decreasing simple carbohydrates for 65% of the time.  Current Exercise Plan: Yard work for 120 minutes 1-2 times per week. Current Anti-Obesity Medications: phentermine 37.5 mg daily, Saxenda daily. Side effects: None.  Assessment/Plan:   Meds ordered this encounter  Medications   escitalopram (LEXAPRO) 10 MG tablet    Sig: TAKE 1 TABLET (10 MG TOTAL) BY MOUTH DAILY.    Dispense:  30 tablet    Refill:  0   Liraglutide -Weight Management (SAXENDA) 18 MG/3ML SOPN    Sig: Inject 3 mg into the skin daily.    Dispense:  15 mL    Refill:  6    1. Polyphagia At goal. Current treatment: phentermine and Saxenda. Polyphagia refers to excessive feelings of hunger. He will continue to focus on protein-rich, low simple carbohydrate foods. We reviewed the importance of hydration, regular exercise for stress reduction, and restorative sleep.  - Refill Liraglutide -Weight Management (SAXENDA) 18 MG/3ML SOPN; Inject 3 mg into the skin daily.  Dispense: 15 mL; Refill: 6  2. Vitamin D deficiency At goal. Current vitamin D is 82.8, tested on 05/19/20. Optimal goal > 50 ng/dL.   Plan: Continue current OTC vitamin D supplementation.  Follow-up for routine testing of Vitamin D, at least 2-3 times per year to avoid over-replacement.  3. Anxiety, with emotional eating Not optimized.  Medication: Wellbutrin XL 300 mg daily and Lexapro 10 mg daily.   Plan:  Discussed cues and consequences, how thoughts affect eating, model of thoughts, feelings, and behaviors, and strategies for change by focusing on the cue. Discussed cognitive distortions, coping thoughts, and how to change your thoughts.  - Refill escitalopram (LEXAPRO) 10 MG tablet; TAKE 1 TABLET (10 MG TOTAL) BY MOUTH DAILY.  Dispense: 30 tablet; Refill: 0  4. At risk for impaired metabolic function Due to Vernal's current state of health and medical condition(s), he is at a significantly higher risk for impaired metabolic function.  At least 8 minutes was spent on counseling Kent Griffith about these concerns today.    5. Obesity, current BMI 27.6  Course: Kent Griffith is currently in the action stage of change. As such, his goal is to continue with weight loss efforts.   Nutrition goals: He has agreed to practicing portion control and making smarter food choices, such as increasing vegetables and decreasing simple carbohydrates.   Exercise goals: For substantial health benefits, adults should do at least 150 minutes (2 hours and 30 minutes) a week of moderate-intensity, or 75 minutes (1 hour and 15 minutes) a week of vigorous-intensity aerobic physical activity, or an equivalent combination of moderate- and vigorous-intensity aerobic activity. Aerobic activity should be performed in episodes of at least 10 minutes, and preferably, it should be spread throughout the week.  Behavioral modification strategies: increasing lean protein intake, decreasing simple carbohydrates, increasing vegetables, and increasing water intake.  [name] has agreed to follow-up  with our clinic in 3 weeks. He was informed of the importance of frequent follow-up visits to maximize his success with intensive lifestyle modifications for his multiple health conditions.   Objective:   Blood pressure (!) 109/55, pulse 77, temperature 98.1 F (36.7 C), temperature  source Oral, height 5\' 10"  (1.778 m), weight 192 lb (87.1 kg), SpO2 100 %. Body mass index is 27.55 kg/m.  General: Cooperative, alert, well developed, in no acute distress. HEENT: Conjunctivae and lids unremarkable. Cardiovascular: Regular rhythm.  Lungs: Normal work of breathing. Neurologic: No focal deficits.   Lab Results  Component Value Date   CREATININE 0.92 10/13/2020   BUN 13 10/13/2020   NA 141 10/13/2020   K 3.8 10/13/2020   CL 102 10/13/2020   CO2 26 10/13/2020   Lab Results  Component Value Date   ALT 19 10/13/2020   AST 21 10/13/2020   ALKPHOS 75 10/13/2020   BILITOT 0.4 10/13/2020   Lab Results  Component Value Date   HGBA1C 4.8 05/19/2020   HGBA1C 4.9 05/07/2019   HGBA1C 5.0 07/12/2018   Lab Results  Component Value Date   INSULIN 4.5 05/19/2020   INSULIN 12.3 05/07/2019   Lab Results  Component Value Date   TSH 4.150 12/11/2019   Lab Results  Component Value Date   CHOL 125 05/19/2020   HDL 45 05/19/2020   LDLCALC 68 05/19/2020   LDLDIRECT 99.0 07/12/2018   TRIG 55 05/19/2020   CHOLHDL 3 07/24/2019   Lab Results  Component Value Date   WBC 5.7 10/13/2020   HGB 13.3 10/13/2020   HCT 39.4 10/13/2020   MCV 85 10/13/2020   PLT 154 10/13/2020   Lab Results  Component Value Date   IRON 93 12/11/2019   TIBC 261 12/11/2019   FERRITIN 78 12/11/2019   Attestation Statements:   Reviewed by clinician on day of visit: allergies, medications, problem list, medical history, surgical history, family history, social history, and previous encounter notes.  I, 02/10/2020, CMA, am acting as transcriptionist for Insurance claims handler, DO  I have reviewed the above documentation for accuracy and completeness, and I agree with the above. Helane Rima, DO

## 2020-12-23 ENCOUNTER — Other Ambulatory Visit (HOSPITAL_COMMUNITY): Payer: Self-pay

## 2020-12-23 MED ORDER — TIRZEPATIDE 2.5 MG/0.5ML ~~LOC~~ SOAJ
2.5000 mg | SUBCUTANEOUS | 0 refills | Status: DC
  Filled 2020-12-23: qty 2, 28d supply, fill #0

## 2020-12-23 NOTE — Addendum Note (Signed)
Addended by: Scarlett Presto on: 12/23/2020 12:28 PM   Modules accepted: Orders

## 2020-12-24 ENCOUNTER — Other Ambulatory Visit (HOSPITAL_COMMUNITY): Payer: Self-pay

## 2020-12-30 ENCOUNTER — Ambulatory Visit: Payer: 59 | Admitting: Family Medicine

## 2020-12-30 ENCOUNTER — Other Ambulatory Visit: Payer: Self-pay

## 2020-12-30 ENCOUNTER — Encounter: Payer: Self-pay | Admitting: Family Medicine

## 2020-12-30 VITALS — BP 114/74 | HR 74 | Ht 70.0 in | Wt 198.0 lb

## 2020-12-30 DIAGNOSIS — M9903 Segmental and somatic dysfunction of lumbar region: Secondary | ICD-10-CM

## 2020-12-30 DIAGNOSIS — M9904 Segmental and somatic dysfunction of sacral region: Secondary | ICD-10-CM | POA: Diagnosis not present

## 2020-12-30 DIAGNOSIS — M9901 Segmental and somatic dysfunction of cervical region: Secondary | ICD-10-CM | POA: Diagnosis not present

## 2020-12-30 DIAGNOSIS — M24559 Contracture, unspecified hip: Secondary | ICD-10-CM

## 2020-12-30 DIAGNOSIS — M9908 Segmental and somatic dysfunction of rib cage: Secondary | ICD-10-CM

## 2020-12-30 DIAGNOSIS — M9902 Segmental and somatic dysfunction of thoracic region: Secondary | ICD-10-CM | POA: Diagnosis not present

## 2020-12-30 NOTE — Patient Instructions (Addendum)
Good to see you Overall making progress Continue exercises See me again in 6-8 weeks

## 2020-12-30 NOTE — Progress Notes (Signed)
[facility]  [facility] [address] [place] [address] Phone: [phone] Subjective:   I Kent Griffith am serving as a scribe for Dr. Antoine Primas.  This visit occurred during the SARS-CoV-2 public health emergency.  Safety protocols were in place, including screening questions prior to the visit, additional usage of staff PPE, and extensive cleaning of exam room while observing appropriate contact time as indicated for disinfecting solutions.   I'm seeing this patient by the request  of:  Ardith Dark, MD  CC: Low back pain follow-up  [id number]  Kent Griffith is a 25 y.o. male coming in with complaint of back and neck pain. OMT 11/13/2020. Patient states his back has been hurting more lately.  Patient has been fairly active recently.  Patient feels possibly his sleeping position could be potentially given some difficulty.  Patient denies any radiation down the legs.  He denies anything that is stopping him from activity.  Medications patient has been prescribed: Meloxicam  Taking: Intermittently          Past Medical History:  Diagnosis Date   Anxiety    Depression    GERD (gastroesophageal reflux disease)    Hypothyroidism    IBS (irritable bowel syndrome)    Joint pain    Lactose intolerance    Obesity    Sleep apnea     Allergies  Allergen Reactions   Penicillins Anaphylaxis, Hives, Itching and Rash   Vyvanse [Lisdexamfetamine Dimesylate] Other (See Comments)    Tics     Review of Systems:  No headache, visual changes, nausea, vomiting, diarrhea, constipation, dizziness, abdominal pain, skin rash, fevers, chills, night sweats, weight loss, swollen lymph nodes, body aches, joint swelling, chest pain, shortness of breath, mood changes. POSITIVE muscle aches  Objective  Blood pressure 114/74, pulse 74, height 5\' 10"  (1.778 m), weight 198 lb (89.8 kg), SpO2 99 %.   General: No apparent  distress alert and oriented x3 mood and affect normal, dressed appropriately.  HEENT: Pupils equal, extraocular movements intact  Respiratory: Patient's speak in full sentences and does not appear short of breath  Cardiovascular: No lower extremity edema, non tender, no erythema  Low back exam does have some mild loss of lordosis.  Tightness noted still of the hip flexors right greater than left.  Mild tightness noted at the thoracolumbar juncture right greater than left.  5 out of 5 strength otherwise.  Osteopathic findings  C6 flexed rotated and side bent left T4 extended rotated and side bent right inhaled rib L1 flexed rotated and side bent right Sacrum right on right       Assessment and Plan:   Hip flexor tightness Continued tightness more in the thoracolumbar juncture.  Discussed with patient icing regimen and home exercises.  Encourage patient to continue to work on the weight which patient is doing very well.  Discussed with patient about the core strengthening.  Follow-up again in 6 weeks  Nonallopathic problems  Decision today to treat with OMT was based on Physical Exam  After verbal consent patient was treated with HVLA, ME, FPR techniques in cervical, rib, thoracic, lumbar, and sacral  areas  Patient tolerated the procedure well with improvement in symptoms  Patient given exercises, stretches and lifestyle modifications  See medications in patient instructions if given  Patient will follow up in 4-8 weeks      The above documentation has been reviewed and is accurate and complete  Judi Saa, DO       Note: This dictation was prepared with Dragon dictation along with smaller phrase technology. Any transcriptional errors that result from this process are unintentional.         [address] [place] [id number] Phone: [phone] Subjective:    I'm seeing this patient by the request  of:  Ardith Dark, MD  CC:   [id number]  Kent Griffith is a 25 y.o. male coming in with complaint of back and neck pain. OMT 11/13/2020. Patient states   Medications patient has been prescribed: Meloxicam  Taking:         Reviewed prior external information including notes and imaging from previsou exam, outside providers and external EMR if available.   As well as notes that were available from care everywhere and other healthcare systems.  Past medical history, social, surgical and family history all reviewed in electronic medical record.  No pertanent information unless stated regarding to the chief complaint.   Past Medical History:  Diagnosis Date   Anxiety    Depression    GERD (gastroesophageal reflux disease)    Hypothyroidism    IBS (irritable bowel syndrome)    Joint pain    Lactose intolerance    Obesity    Sleep apnea     Allergies  Allergen Reactions   Penicillins Anaphylaxis, Hives, Itching and Rash   Vyvanse [Lisdexamfetamine Dimesylate] Other (See Comments)    Tics     Review of Systems:  No headache, visual changes, nausea, vomiting, diarrhea, constipation, dizziness, abdominal pain, skin rash, fevers, chills, night sweats, weight loss, swollen lymph nodes, body aches, joint swelling, chest pain, shortness of breath, mood changes. POSITIVE muscle aches  Objective  Blood pressure 114/74, pulse 74, height 5\' 10"  (1.778 m), weight 198 lb (89.8 kg), SpO2 99 %.   General: No apparent distress alert and oriented x3 mood and affect normal, dressed appropriately.  HEENT: Pupils equal, extraocular movements intact  Respiratory: Patient's speak in full sentences and does not appear short of breath  Cardiovascular: No lower extremity edema, non tender, no erythema  Neuro: Cranial nerves II through XII are intact, neurovascularly intact in all extremities with 2+ DTRs and 2+ pulses.  Gait normal with good balance and coordination.  MSK:  Non tender with full range of motion and good stability and symmetric  strength and tone of shoulders, elbows, wrist, hip, knee and ankles bilaterally.  Back - Normal skin, Spine with normal alignment and no deformity.  No tenderness to vertebral process palpation.  Paraspinous muscles are not tender and without spasm.   Range of motion is full at neck and lumbar sacral regions  Osteopathic findings  C2 flexed rotated and side bent right C6 flexed rotated and side bent left T3 extended rotated and side bent right inhaled rib T9 extended rotated and side bent left L2 flexed rotated and side bent right Sacrum right on right       Assessment and Plan:    Nonallopathic problems  Decision today to treat with OMT was based on Physical Exam  After verbal consent patient was treated with HVLA, ME, FPR techniques in cervical, rib, thoracic, lumbar, and sacral  areas  Patient tolerated the procedure well with improvement in symptoms  Patient given exercises, stretches and lifestyle modifications  See medications in patient instructions if given  Patient will follow up in 4-8 weeks      The above documentation has been reviewed and  is accurate and complete Lyndal Pulley, DO       Note: This dictation was prepared with Dragon dictation along with smaller phrase technology. Any transcriptional errors that result from this process are unintentional.

## 2020-12-30 NOTE — Assessment & Plan Note (Signed)
Continued tightness more in the thoracolumbar juncture.  Discussed with patient icing regimen and home exercises.  Encourage patient to continue to work on the weight which patient is doing very well.  Discussed with patient about the core strengthening.  Follow-up again in 6 weeks

## 2021-01-05 ENCOUNTER — Other Ambulatory Visit (HOSPITAL_COMMUNITY): Payer: Self-pay

## 2021-01-05 ENCOUNTER — Other Ambulatory Visit: Payer: Self-pay

## 2021-01-05 ENCOUNTER — Ambulatory Visit (INDEPENDENT_AMBULATORY_CARE_PROVIDER_SITE_OTHER): Payer: 59 | Admitting: Family Medicine

## 2021-01-05 VITALS — BP 120/65 | HR 74 | Temp 98.0°F | Ht 70.0 in | Wt 202.0 lb

## 2021-01-05 DIAGNOSIS — Z6838 Body mass index (BMI) 38.0-38.9, adult: Secondary | ICD-10-CM

## 2021-01-05 DIAGNOSIS — R635 Abnormal weight gain: Secondary | ICD-10-CM

## 2021-01-05 DIAGNOSIS — R632 Polyphagia: Secondary | ICD-10-CM

## 2021-01-05 DIAGNOSIS — F419 Anxiety disorder, unspecified: Secondary | ICD-10-CM

## 2021-01-06 ENCOUNTER — Other Ambulatory Visit (HOSPITAL_COMMUNITY): Payer: Self-pay

## 2021-01-06 ENCOUNTER — Encounter (INDEPENDENT_AMBULATORY_CARE_PROVIDER_SITE_OTHER): Payer: Self-pay | Admitting: Family Medicine

## 2021-01-06 MED ORDER — PHENTERMINE HCL 37.5 MG PO TABS
ORAL_TABLET | Freq: Every day | ORAL | 0 refills | Status: DC
  Filled 2021-01-06: qty 30, 30d supply, fill #0

## 2021-01-06 MED ORDER — TOPIRAMATE 25 MG PO TABS
25.0000 mg | ORAL_TABLET | Freq: Two times a day (BID) | ORAL | 0 refills | Status: DC
Start: 2021-01-06 — End: 2021-01-27
  Filled 2021-01-06: qty 60, 30d supply, fill #0

## 2021-01-06 MED ORDER — ESCITALOPRAM OXALATE 10 MG PO TABS
ORAL_TABLET | Freq: Every day | ORAL | 0 refills | Status: DC
  Filled 2021-01-06 – 2021-01-19 (×2): qty 30, 30d supply, fill #0

## 2021-01-06 NOTE — Progress Notes (Signed)
Chief Complaint:   OBESITY Kent Griffith is here to discuss his progress with his obesity treatment plan along with follow-up of his obesity related diagnoses. See Medical Weight Management Flowsheet for complete bioelectrical impedance results.  Today's visit was #: 24 Starting weight: 270 lbs Starting date: 04/27/2019 Today's weight: 202 lbs Today's date: 01/05/2021 Weight change since last visit: +10 lbs Total lbs lost to date: 68 lbs Body mass index is 28.98 kg/m.  Total weight loss percentage to date: -25.19%  Interim History: Kent Griffith says he is worried about his weight gain.  He reports doing more overeating.  He went to a wedding last week.  Half of his weight gain is water weight.  He is transitioning to [facility].  Nutrition Plan: practicing portion control and making smarter food choices, such as increasing vegetables and decreasing simple carbohydrates for 50% of the time. Activity:  Resistance training for 10 minutes 1-2 times per week. Anti-obesity medications: Mounjaro 2.5 mg subcutaneously weekly and phentermine 37.5 mg daily. Reported side effects: None.  Assessment/Plan:   1. Weight gain Kent Griffith reports being worried about his weight gain.  Plan:  Continue Mounjaro and phentermine.  Continue meal plan.  2. Anxiety, with emotional eating Kent Griffith is taking Lexapro 10 mg daily and Topamax25 mg twice daily.  Plan:  Continue medications.  Will refill today, as per below.  I have consulted the [place] Controlled Substances Registry for this patient, and feel the risk/benefit ratio today is favorable for proceeding with this prescription for a controlled substance. The patient understands monitoring parameters and red flags.   - Refill escitalopram (LEXAPRO) 10 MG tablet; TAKE 1 TABLET (10 MG TOTAL) BY MOUTH DAILY.  Dispense: 30 tablet; Refill: 0 - Refill phentermine (ADIPEX-P) 37.5 MG tablet; TAKE 1 TABLET (37.5 MG TOTAL) BY MOUTH DAILY BEFORE BREAKFAST.  Dispense: 30 tablet; Refill:  0 - Refill topiramate (TOPAMAX) 25 MG tablet; Take 1 tablet (25 mg total) by mouth 2 (two) times daily.  Dispense: 60 tablet; Refill: 0  3. Obesity, current BMI 29.1  Course: Kent Griffith is currently in the action stage of change. As such, his goal is to continue with weight loss efforts.   Nutrition goals: He has agreed to practicing portion control and making smarter food choices, such as increasing vegetables and decreasing simple carbohydrates.   Exercise goals:  As is.  Behavioral modification strategies: increasing lean protein intake, decreasing simple carbohydrates, increasing vegetables, increasing water intake, and emotional eating strategies.  Kent Griffith has agreed to follow-up with our clinic in 2-3 weeks. He was informed of the importance of frequent follow-up visits to maximize his success with intensive lifestyle modifications for his multiple health conditions.   Objective:   Blood pressure 120/65, pulse 74, temperature 98 F (36.7 C), temperature source Oral, height 5\' 10"  (1.778 m), weight 202 lb (91.6 kg), SpO2 100 %. Body mass index is 28.98 kg/m.  General: Cooperative, alert, well developed, in no acute distress. HEENT: Conjunctivae and lids unremarkable. Cardiovascular: Regular rhythm.  Lungs: Normal work of breathing. Neurologic: No focal deficits.   Lab Results  Component Value Date   CREATININE 0.92 10/13/2020   BUN 13 10/13/2020   NA 141 10/13/2020   K 3.8 10/13/2020   CL 102 10/13/2020   CO2 26 10/13/2020   Lab Results  Component Value Date   ALT 19 10/13/2020   AST 21 10/13/2020   ALKPHOS 75 10/13/2020   BILITOT 0.4 10/13/2020   Lab Results  Component Value Date  HGBA1C 4.8 05/19/2020   HGBA1C 4.9 05/07/2019   HGBA1C 5.0 07/12/2018   Lab Results  Component Value Date   INSULIN 4.5 05/19/2020   INSULIN 12.3 05/07/2019   Lab Results  Component Value Date   TSH 4.150 12/11/2019   Lab Results  Component Value Date   CHOL 125 05/19/2020   HDL  45 05/19/2020   LDLCALC 68 05/19/2020   LDLDIRECT 99.0 07/12/2018   TRIG 55 05/19/2020   CHOLHDL 3 07/24/2019   Lab Results  Component Value Date   VD25OH 82.8 05/19/2020   VD25OH 31.7 05/07/2019   Lab Results  Component Value Date   WBC 5.7 10/13/2020   HGB 13.3 10/13/2020   HCT 39.4 10/13/2020   MCV 85 10/13/2020   PLT 154 10/13/2020   Lab Results  Component Value Date   IRON 93 12/11/2019   TIBC 261 12/11/2019   FERRITIN 78 12/11/2019   Attestation Statements:   Reviewed by clinician on day of visit: allergies, medications, problem list, medical history, surgical history, family history, social history, and previous encounter notes.  I, Insurance claims handler, CMA, am acting as transcriptionist for Helane Rima, DO  I have reviewed the above documentation for accuracy and completeness, and I agree with the above. Helane Rima, DO

## 2021-01-18 ENCOUNTER — Other Ambulatory Visit: Payer: Self-pay

## 2021-01-18 ENCOUNTER — Other Ambulatory Visit (HOSPITAL_COMMUNITY): Payer: Self-pay

## 2021-01-18 ENCOUNTER — Other Ambulatory Visit: Payer: Self-pay | Admitting: Family Medicine

## 2021-01-18 MED ORDER — MOUNJARO 5 MG/0.5ML ~~LOC~~ SOAJ
5.0000 mg | SUBCUTANEOUS | 0 refills | Status: DC
  Filled 2021-01-18: qty 2, 28d supply, fill #0

## 2021-01-18 MED ORDER — MELOXICAM 15 MG PO TABS
ORAL_TABLET | Freq: Every day | ORAL | 0 refills | Status: DC
  Filled 2021-01-18 (×2): qty 30, 30d supply, fill #0

## 2021-01-19 ENCOUNTER — Other Ambulatory Visit (HOSPITAL_COMMUNITY): Payer: Self-pay

## 2021-01-20 ENCOUNTER — Other Ambulatory Visit (HOSPITAL_COMMUNITY): Payer: Self-pay

## 2021-01-20 ENCOUNTER — Other Ambulatory Visit (INDEPENDENT_AMBULATORY_CARE_PROVIDER_SITE_OTHER): Payer: Self-pay

## 2021-01-20 MED ORDER — PREDNISONE 5 MG PO TABS
ORAL_TABLET | ORAL | 0 refills | Status: AC
  Filled 2021-01-20: qty 21, 6d supply, fill #0

## 2021-01-27 ENCOUNTER — Encounter (INDEPENDENT_AMBULATORY_CARE_PROVIDER_SITE_OTHER): Payer: Self-pay | Admitting: Family Medicine

## 2021-01-27 ENCOUNTER — Other Ambulatory Visit (HOSPITAL_COMMUNITY): Payer: Self-pay

## 2021-01-27 ENCOUNTER — Other Ambulatory Visit: Payer: Self-pay

## 2021-01-27 ENCOUNTER — Ambulatory Visit (INDEPENDENT_AMBULATORY_CARE_PROVIDER_SITE_OTHER): Payer: 59 | Admitting: Family Medicine

## 2021-01-27 VITALS — BP 104/63 | HR 73 | Temp 97.5°F | Ht 70.0 in | Wt 191.0 lb

## 2021-01-27 DIAGNOSIS — F411 Generalized anxiety disorder: Secondary | ICD-10-CM

## 2021-01-27 DIAGNOSIS — R632 Polyphagia: Secondary | ICD-10-CM | POA: Diagnosis not present

## 2021-01-27 DIAGNOSIS — K5903 Drug induced constipation: Secondary | ICD-10-CM | POA: Diagnosis not present

## 2021-01-27 DIAGNOSIS — Z79899 Other long term (current) drug therapy: Secondary | ICD-10-CM | POA: Diagnosis not present

## 2021-01-27 DIAGNOSIS — Z9189 Other specified personal risk factors, not elsewhere classified: Secondary | ICD-10-CM

## 2021-01-27 DIAGNOSIS — Z6838 Body mass index (BMI) 38.0-38.9, adult: Secondary | ICD-10-CM

## 2021-01-27 DIAGNOSIS — G4489 Other headache syndrome: Secondary | ICD-10-CM

## 2021-01-27 DIAGNOSIS — E559 Vitamin D deficiency, unspecified: Secondary | ICD-10-CM | POA: Diagnosis not present

## 2021-01-27 DIAGNOSIS — F419 Anxiety disorder, unspecified: Secondary | ICD-10-CM

## 2021-01-27 DIAGNOSIS — K219 Gastro-esophageal reflux disease without esophagitis: Secondary | ICD-10-CM

## 2021-01-27 MED ORDER — MOUNJARO 5 MG/0.5ML ~~LOC~~ SOAJ
5.0000 mg | SUBCUTANEOUS | 0 refills | Status: DC
Start: 2021-01-27 — End: 2021-02-24
  Filled 2021-01-27: qty 2, 28d supply, fill #0

## 2021-01-27 MED ORDER — TROKENDI XR 50 MG PO CP24
1.0000 | ORAL_CAPSULE | Freq: Every day | ORAL | 1 refills | Status: DC
  Filled 2021-01-27 (×2): qty 30, 30d supply, fill #0

## 2021-01-27 MED ORDER — LINACLOTIDE 145 MCG PO CAPS
145.0000 ug | ORAL_CAPSULE | Freq: Every day | ORAL | 0 refills | Status: DC | PRN
Start: 2021-01-27 — End: 2021-10-06
  Filled 2021-01-27 (×2): qty 30, 30d supply, fill #0

## 2021-01-27 MED ORDER — OMEPRAZOLE 20 MG PO CPDR
20.0000 mg | DELAYED_RELEASE_CAPSULE | Freq: Every day | ORAL | 0 refills | Status: DC
  Filled 2021-01-27 (×2): qty 90, 90d supply, fill #0

## 2021-01-27 MED ORDER — BUPROPION HCL ER (XL) 300 MG PO TB24
300.0000 mg | ORAL_TABLET | Freq: Every day | ORAL | 0 refills | Status: DC
  Filled 2021-01-27: qty 90, fill #0
  Filled 2021-02-20: qty 90, 90d supply, fill #0

## 2021-01-27 MED ORDER — ESCITALOPRAM OXALATE 10 MG PO TABS
10.0000 mg | ORAL_TABLET | Freq: Every day | ORAL | 0 refills | Status: DC
Start: 2021-01-27 — End: 2021-03-16
  Filled 2021-01-27: qty 30, fill #0
  Filled 2021-02-20: qty 30, 30d supply, fill #0

## 2021-01-28 ENCOUNTER — Other Ambulatory Visit (HOSPITAL_COMMUNITY): Payer: Self-pay

## 2021-01-28 LAB — COMPREHENSIVE METABOLIC PANEL
ALT: 18 IU/L (ref 0–44)
AST: 21 IU/L (ref 0–40)
Albumin/Globulin Ratio: 2.3 — ABNORMAL HIGH (ref 1.2–2.2)
Albumin: 5.3 g/dL — ABNORMAL HIGH (ref 4.1–5.2)
Alkaline Phosphatase: 68 IU/L (ref 44–121)
BUN/Creatinine Ratio: 11 (ref 9–20)
BUN: 13 mg/dL (ref 6–20)
Bilirubin Total: 0.7 mg/dL (ref 0.0–1.2)
CO2: 26 mmol/L (ref 20–29)
Calcium: 9.4 mg/dL (ref 8.7–10.2)
Chloride: 102 mmol/L (ref 96–106)
Creatinine, Ser: 1.16 mg/dL (ref 0.76–1.27)
Globulin, Total: 2.3 g/dL (ref 1.5–4.5)
Glucose: 74 mg/dL (ref 65–99)
Potassium: 4 mmol/L (ref 3.5–5.2)
Sodium: 141 mmol/L (ref 134–144)
Total Protein: 7.6 g/dL (ref 6.0–8.5)
eGFR: 89 mL/min/{1.73_m2} (ref >59)

## 2021-01-28 LAB — VITAMIN B12: Vitamin B-12: 617 pg/mL (ref 232–1245)

## 2021-01-28 LAB — LIPID PANEL
Chol/HDL Ratio: 2.7 ratio (ref 0.0–5.0)
Cholesterol, Total: 152 mg/dL (ref 100–199)
HDL: 56 mg/dL (ref >39)
LDL Chol Calc (NIH): 79 mg/dL (ref 0–99)
Triglycerides: 94 mg/dL (ref 0–149)
VLDL Cholesterol Cal: 17 mg/dL (ref 5–40)

## 2021-01-28 LAB — INSULIN, RANDOM: INSULIN: 2.5 u[IU]/mL — ABNORMAL LOW (ref 2.6–24.9)

## 2021-01-28 LAB — VITAMIN D 25 HYDROXY (VIT D DEFICIENCY, FRACTURES): Vit D, 25-Hydroxy: 71.8 ng/mL (ref 30.0–100.0)

## 2021-02-06 NOTE — Progress Notes (Signed)
Chief Complaint:   OBESITY Kent Griffith is here to discuss his progress with his obesity treatment plan along with follow-up of his obesity related diagnoses.   Today's visit was #: 25 Starting weight: 270 lbs Starting date: 04/27/2019 Today's weight: 191 lbs Today's date: 01/27/2021 Weight change since last visit: 11 lbs Total lbs lost to date: 79 lbs Body mass index is 27.41 kg/m.  Total weight loss percentage to date: -29.26%  Interim History:  Sandra says that he is surprised by his weight loss.  He is still getting in his calories/protein.  He endorses increased headaches over the past few weeks.  Current Meal Plan: practicing portion control and making smarter food choices, such as increasing vegetables and decreasing simple carbohydrates for 75% of the time.  Current Exercise Plan: None. Current Anti-Obesity Medications: phentermine 37.5 mg daily and Mounjaro 5 mg subcutaneously weekly. Side effects: None.  Assessment/Plan:   Orders Placed This Encounter  Procedures   Comprehensive metabolic panel   Insulin, random   Lipid panel   VITAMIN D 25 Hydroxy (Vit-D Deficiency, Fractures)   Vitamin B12   Meds ordered this encounter  Medications   buPROPion (WELLBUTRIN XL) 300 MG 24 hr tablet    Sig: Take 1 tablet (300 mg total) by mouth daily.    Dispense:  90 tablet    Refill:  0   escitalopram (LEXAPRO) 10 MG tablet    Sig: Take 1 tablet (10 mg total) by mouth daily.    Dispense:  30 tablet    Refill:  0   linaclotide (LINZESS) 145 MCG CAPS capsule    Sig: Take 1 capsule (145 mcg total) by mouth daily as needed.    Dispense:  30 capsule    Refill:  0   omeprazole (PRILOSEC) 20 MG capsule    Sig: Take 1 capsule (20 mg total) by mouth daily.    Dispense:  90 capsule    Refill:  0   tirzepatide (MOUNJARO) 5 MG/0.5ML Pen    Sig: Inject 5 mg into the skin once a week.    Dispense:  2 mL    Refill:  0   Topiramate ER (TROKENDI XR) 50 MG CP24    Sig: Take 1 capsule by  mouth daily.    Dispense:  30 capsule    Refill:  1    Patient will pick up today after work - do not mail.    1. Polyphagia Controlled. Current treatment: Mounjaro 5 mg subcutaneously weekly and phentermine 37.5 mg daily. Polyphagia refers to excessive feelings of hunger.   Plan:  Continue Mounjaro at current dose.  Stop phentermine.  Start Trokendi at 50 mg daily, as per below.  He will continue to focus on protein-rich, low simple carbohydrate foods. We reviewed the importance of hydration, regular exercise for stress reduction, and restorative sleep.  - Refill tirzepatide (MOUNJARO) 5 MG/0.5ML Pen; Inject 5 mg into the skin once a week.  Dispense: 2 mL; Refill: 0 - Start Topiramate ER (TROKENDI XR) 50 MG CP24; Take 1 capsule by mouth daily.  Dispense: 30 capsule; Refill: 1  2. Other headache syndrome Hopefully will improve with increase in Trokendi.  Also discussed stretches, massage, increasing water intake with electrolytes, and watching stress levels and blood sugar.  3. Vitamin D deficiency At goal. He is taking OTC vitamin D 2,000 IU daily.  Plan: Continue current OTC vitamin D supplementation.  Will check vitamin D and B12 levels today.  Lab Results  Component Value Date   VD25OH 71.8 01/27/2021   VD25OH 82.8 05/19/2020   VD25OH 31.7 05/07/2019   - VITAMIN D 25 Hydroxy (Vit-D Deficiency, Fractures) - Vitamin B12  4. Medication management Will check labs today, as per below.  - Comprehensive metabolic panel - Insulin, random - Lipid panel - VITAMIN D 25 Hydroxy (Vit-D Deficiency, Fractures) - Vitamin B12  5. Drug-induced constipation This problem is controlled with Linzess. Keaden was informed that a decrease in bowel movement frequency is normal while losing weight, but stools should not be hard or painful.  Counseling: Getting to Good Bowel Health: Your goal is to have one soft bowel movement each day. Drink at least 8 glasses of water each day. Eat plenty of fiber  (goal is over 30 grams each day). It is best to get most of your fiber from dietary sources which includes leafy green vegetables, fresh fruit, and whole grains. You may need to add fiber with the help of OTC fiber supplements. These include Metamucil, Citrucel, and Benefiber. If you are still having trouble, try adding an osmotic laxative such as Miralax. If all of these changes do not work, Dietitian.   - Refill linaclotide (LINZESS) 145 MCG CAPS capsule; Take 1 capsule (145 mcg total) by mouth daily as needed.  Dispense: 30 capsule; Refill: 0  6. Gastroesophageal reflux disease without esophagitis Gavriel is taking omeprazole 20 mg daily.  Plan:  Continue omeprazole at current dose for GERD symptoms.  We reviewed the diagnosis of GERD and importance of treatment. We discussed "red flag" symptoms and the importance of follow up if symptoms persisted despite treatment. We reviewed non-pharmacologic management of GERD symptoms: including: caffeine reduction, dietary changes, elevate HOB, NPO after supper, reduction of alcohol intake, tobacco cessation, and weight loss.  - Refill omeprazole (PRILOSEC) 20 MG capsule; Take 1 capsule (20 mg total) by mouth daily.  Dispense: 90 capsule; Refill: 0  7. GAD (generalized anxiety disorder) GAD is currently controlled with Wellbutrin XL 300 mg daily and Lexapro 10 mg daily.  Will refill both medications today at current doses.  - Refill buPROPion (WELLBUTRIN XL) 300 MG 24 hr tablet; Take 1 tablet (300 mg total) by mouth daily.  Dispense: 90 tablet; Refill: 0 - Refill escitalopram (LEXAPRO) 10 MG tablet; Take 1 tablet (10 mg total) by mouth daily.  Dispense: 30 tablet; Refill: 0  8. At risk for deficient intake of food Elija was given extensive education and counseling today of more than 8 minutes on risks associated with deficient food intake.  Counseled him on the importance of following our prescribed meal plan and eating adequate amounts of  protein.  Discussed with Jamarr Treinen that inadequate food intake over longer periods of time can slow their metabolism down significantly.   9. Obesity, current BMI 27.5  Course: Sabastian is currently in the action stage of change. As such, his goal is to continue with weight loss efforts.   Nutrition goals: He has agreed to practicing portion control and making smarter food choices, such as increasing vegetables and decreasing simple carbohydrates.   Exercise goals:  Increase activity/strength training.  Behavioral modification strategies: increasing lean protein intake, decreasing simple carbohydrates, increasing vegetables, and increasing water intake.  Karston has agreed to follow-up with our clinic in 4 weeks. He was informed of the importance of frequent follow-up visits to maximize his success with intensive lifestyle modifications for his multiple health conditions.   Trexton was informed we would discuss his lab results  at his next visit unless there is a critical issue that needs to be addressed sooner. Derrik agreed to keep his next visit at the agreed upon time to discuss these results.  Objective:   Blood pressure 104/63, pulse 73, temperature (!) 97.5 F (36.4 C), temperature source Oral, height 5\' 10"  (1.778 m), weight 191 lb (86.6 kg), SpO2 98 %. Body mass index is 27.41 kg/m.  General: Cooperative, alert, well developed, in no acute distress. HEENT: Conjunctivae and lids unremarkable. Cardiovascular: Regular rhythm.  Lungs: Normal work of breathing. Neurologic: No focal deficits.   Lab Results  Component Value Date   CREATININE 1.16 01/27/2021   BUN 13 01/27/2021   NA 141 01/27/2021   K 4.0 01/27/2021   CL 102 01/27/2021   CO2 26 01/27/2021   Lab Results  Component Value Date   ALT 18 01/27/2021   AST 21 01/27/2021   ALKPHOS 68 01/27/2021   BILITOT 0.7 01/27/2021   Lab Results  Component Value Date   HGBA1C 4.8 05/19/2020   HGBA1C 4.9 05/07/2019   HGBA1C 5.0  07/12/2018   Lab Results  Component Value Date   INSULIN 2.5 (L) 01/27/2021   INSULIN 4.5 05/19/2020   INSULIN 12.3 05/07/2019   Lab Results  Component Value Date   TSH 4.150 12/11/2019   Lab Results  Component Value Date   CHOL 152 01/27/2021   HDL 56 01/27/2021   LDLCALC 79 01/27/2021   LDLDIRECT 99.0 07/12/2018   TRIG 94 01/27/2021   CHOLHDL 2.7 01/27/2021   Lab Results  Component Value Date   VD25OH 71.8 01/27/2021   VD25OH 82.8 05/19/2020   VD25OH 31.7 05/07/2019   Lab Results  Component Value Date   WBC 5.7 10/13/2020   HGB 13.3 10/13/2020   HCT 39.4 10/13/2020   MCV 85 10/13/2020   PLT 154 10/13/2020   Lab Results  Component Value Date   IRON 93 12/11/2019   TIBC 261 12/11/2019   FERRITIN 78 12/11/2019   Attestation Statements:   Reviewed by clinician on day of visit: allergies, medications, problem list, medical history, surgical history, family history, social history, and previous encounter notes.  I, 02/10/2020, CMA, am acting as transcriptionist for Insurance claims handler, DO  I have reviewed the above documentation for accuracy and completeness, and I agree with the above. Helane Rima, DO

## 2021-02-12 ENCOUNTER — Ambulatory Visit: Payer: 59 | Admitting: Family Medicine

## 2021-02-20 ENCOUNTER — Other Ambulatory Visit (HOSPITAL_COMMUNITY): Payer: Self-pay

## 2021-02-23 ENCOUNTER — Encounter (INDEPENDENT_AMBULATORY_CARE_PROVIDER_SITE_OTHER): Payer: Self-pay | Admitting: Family Medicine

## 2021-02-23 ENCOUNTER — Other Ambulatory Visit: Payer: Self-pay

## 2021-02-23 ENCOUNTER — Ambulatory Visit (INDEPENDENT_AMBULATORY_CARE_PROVIDER_SITE_OTHER): Payer: 59 | Admitting: Family Medicine

## 2021-02-23 VITALS — BP 112/66 | HR 67 | Temp 97.9°F | Ht 70.0 in | Wt 191.0 lb

## 2021-02-23 DIAGNOSIS — R7301 Impaired fasting glucose: Secondary | ICD-10-CM

## 2021-02-23 DIAGNOSIS — Z6838 Body mass index (BMI) 38.0-38.9, adult: Secondary | ICD-10-CM | POA: Diagnosis not present

## 2021-02-23 DIAGNOSIS — R632 Polyphagia: Secondary | ICD-10-CM | POA: Diagnosis not present

## 2021-02-23 DIAGNOSIS — F411 Generalized anxiety disorder: Secondary | ICD-10-CM

## 2021-02-24 ENCOUNTER — Other Ambulatory Visit (HOSPITAL_COMMUNITY): Payer: Self-pay

## 2021-02-24 MED ORDER — TROKENDI XR 50 MG PO CP24
1.0000 | ORAL_CAPSULE | Freq: Every day | ORAL | 1 refills | Status: DC
  Filled 2021-02-24: qty 30, 30d supply, fill #0

## 2021-02-24 MED ORDER — MOUNJARO 7.5 MG/0.5ML ~~LOC~~ SOAJ
7.5000 mg | SUBCUTANEOUS | 0 refills | Status: DC
  Filled 2021-02-24: qty 2, 28d supply, fill #0

## 2021-02-25 ENCOUNTER — Other Ambulatory Visit (HOSPITAL_COMMUNITY): Payer: Self-pay

## 2021-03-01 NOTE — Progress Notes (Signed)
Chief Complaint:   OBESITY Kent Griffith is here to discuss his progress with his obesity treatment plan along with follow-up of his obesity related diagnoses.   Today's visit was #: 26 Starting weight: 270 lbs Starting date: 04/27/2019 Today's weight: 191 lbs Today's date: 02/23/2021 Weight change since last visit: 0 Total lbs lost to date: 79 lbs Body mass index is 27.41 kg/m.  Total weight loss percentage to date: -29.26%  Current Meal Plan: practicing portion control and making smarter food choices, such as increasing vegetables and decreasing simple carbohydrates for 80-90% of the time.  Current Exercise Plan: No scheduled exercise, but has been more active. Current Anti-Obesity Medications: Mounjaro 5 mg subcutaneously weekly. Side effects: None.  Interim History:  Kent Griffith is maintaining his weight.  He is just back from vacation.  He endorses mild polyphagia.  Assessment/Plan:   1. Impaired fasting glucose Controlled, but with continued polyphagia. Kent Griffith is currently taking Mounjaro 5 mg subcutaneously weekly.  Plan:  Increase Mounjaro to 7.5 mg subcutaneously weekly.  - Increase and refill tirzepatide (MOUNJARO) 7.5 MG/0.5ML Pen; Inject 7.5 mg into the skin once a week.  Dispense: 2 mL; Refill: 0  2. Polyphagia Not optimized. Current treatment: Kent Griffith 50 mg daily. He will continue to focus on protein-rich, low simple carbohydrate foods. We reviewed the importance of hydration, regular exercise for stress reduction, and restorative sleep.  - Refill Topiramate ER (Kent Griffith) 50 MG CP24; Take 1 capsule by mouth daily.  Dispense: 30 capsule; Refill: 1  3. GAD with emotional eating Controlled. Medication: Wellbutrin XL 300 mg daily and Lexapro 10 mg daily.  Plan:  Continue current medications.  Discussed cues and consequences, how thoughts affect eating, model of thoughts, feelings, and behaviors, and strategies for change by focusing on the cue. Discussed cognitive  distortions, coping thoughts, and how to change your thoughts.  4. Obesity, current BMI 27.5  Course: Kent Griffith is currently in the action stage of change. As such, his goal is to continue with weight loss efforts.   Nutrition goals: He has agreed to practicing portion control and making smarter food choices, such as increasing vegetables and decreasing simple carbohydrates.   Exercise goals:  As is.  Behavioral modification strategies: increasing lean protein intake, decreasing simple carbohydrates, increasing vegetables, and increasing water intake.  Kent Griffith has agreed to follow-up with our clinic in 4 weeks. He was informed of the importance of frequent follow-up visits to maximize his success with intensive lifestyle modifications for his multiple health conditions.   Objective:   Blood pressure 112/66, pulse 67, temperature 97.9 F (36.6 C), temperature source Oral, height 5\' 10"  (1.778 m), SpO2 100 %. Body mass index is 27.41 kg/m.  General: Cooperative, alert, well developed, in no acute distress. HEENT: Conjunctivae and lids unremarkable. Cardiovascular: Regular rhythm.  Lungs: Normal work of breathing. Neurologic: No focal deficits.   Lab Results  Component Value Date   CREATININE 1.16 01/27/2021   BUN 13 01/27/2021   NA 141 01/27/2021   K 4.0 01/27/2021   CL 102 01/27/2021   CO2 26 01/27/2021   Lab Results  Component Value Date   ALT 18 01/27/2021   AST 21 01/27/2021   ALKPHOS 68 01/27/2021   BILITOT 0.7 01/27/2021   Lab Results  Component Value Date   HGBA1C 4.8 05/19/2020   HGBA1C 4.9 05/07/2019   HGBA1C 5.0 07/12/2018   Lab Results  Component Value Date   INSULIN 2.5 (L) 01/27/2021   INSULIN 4.5 05/19/2020  INSULIN 12.3 05/07/2019   Lab Results  Component Value Date   TSH 4.150 12/11/2019   Lab Results  Component Value Date   CHOL 152 01/27/2021   HDL 56 01/27/2021   LDLCALC 79 01/27/2021   LDLDIRECT 99.0 07/12/2018   TRIG 94 01/27/2021    CHOLHDL 2.7 01/27/2021   Lab Results  Component Value Date   VD25OH 71.8 01/27/2021   VD25OH 82.8 05/19/2020   VD25OH 31.7 05/07/2019   Lab Results  Component Value Date   WBC 5.7 10/13/2020   HGB 13.3 10/13/2020   HCT 39.4 10/13/2020   MCV 85 10/13/2020   PLT 154 10/13/2020   Lab Results  Component Value Date   IRON 93 12/11/2019   TIBC 261 12/11/2019   FERRITIN 78 12/11/2019   Attestation Statements:   Reviewed by clinician on day of visit: allergies, medications, problem list, medical history, surgical history, family history, social history, and previous encounter notes.  I, Insurance claims handler, CMA, am acting as transcriptionist for Helane Rima, DO  I have reviewed the above documentation for accuracy and completeness, and I agree with the above. Helane Rima, DO

## 2021-03-04 ENCOUNTER — Ambulatory Visit (INDEPENDENT_AMBULATORY_CARE_PROVIDER_SITE_OTHER): Payer: 59 | Admitting: Family Medicine

## 2021-03-04 ENCOUNTER — Other Ambulatory Visit: Payer: Self-pay

## 2021-03-04 ENCOUNTER — Other Ambulatory Visit (HOSPITAL_COMMUNITY): Payer: Self-pay

## 2021-03-04 ENCOUNTER — Encounter: Payer: Self-pay | Admitting: Family Medicine

## 2021-03-04 VITALS — BP 120/86 | HR 81 | Ht 70.0 in | Wt 190.0 lb

## 2021-03-04 DIAGNOSIS — M9904 Segmental and somatic dysfunction of sacral region: Secondary | ICD-10-CM

## 2021-03-04 DIAGNOSIS — G2589 Other specified extrapyramidal and movement disorders: Secondary | ICD-10-CM | POA: Diagnosis not present

## 2021-03-04 DIAGNOSIS — M9902 Segmental and somatic dysfunction of thoracic region: Secondary | ICD-10-CM

## 2021-03-04 DIAGNOSIS — M9908 Segmental and somatic dysfunction of rib cage: Secondary | ICD-10-CM

## 2021-03-04 DIAGNOSIS — M9903 Segmental and somatic dysfunction of lumbar region: Secondary | ICD-10-CM

## 2021-03-04 DIAGNOSIS — M9901 Segmental and somatic dysfunction of cervical region: Secondary | ICD-10-CM | POA: Diagnosis not present

## 2021-03-04 MED ORDER — MELOXICAM 15 MG PO TABS
ORAL_TABLET | Freq: Every day | ORAL | 0 refills | Status: DC
  Filled 2021-03-04: qty 30, 30d supply, fill #0

## 2021-03-04 NOTE — Assessment & Plan Note (Addendum)
Patient is showing signs and symptoms consistent with more of a scapular dyskinesis.  We discussed posture and ergonomics.  Does respond well to osteopathic manipulation.  Patient still has some mild tightness of the hip flexors bilaterally right greater than left.  Patient encouraged to continue to work on the core strengthening as well.  Follow-up with me again in 6 weeks refilled patient's meloxicam

## 2021-03-04 NOTE — Patient Instructions (Addendum)
Good to see you  I might of popped every level in your neck . I think it will help a lot Keep the shoulders down and back  Refill sent of the meloxicam  See me again in 6 weeks

## 2021-03-04 NOTE — Progress Notes (Signed)
[facility] [address] [place] [address] Phone: [phone] Subjective:   Kent Griffith, am serving as a scribe for Dr. Antoine Primas.  I'm seeing this patient by the request  of:  Ardith Dark, MD  CC: Back and neck pain follow-up  [id number]  Clarice Bonaventure is a 25 y.o. male coming in with complaint of back and neck pain. OMT 12/30/2020. Patient states that his back has been fine, but patient has started having headaches from his neck being really stiff. Patient has not taken anything for the pain. Patient feels like neck needs to be popped.  Patient denies any radiation down the arms.  Does notice that it is getting more tightness in the scapular  Medications patient has been prescribed: Meloxicam   Taking:Meloxicam           Past medical history, social, surgical and family history all reviewed in electronic medical record.  No pertanent information unless stated regarding to the chief complaint.   Past Medical History:  Diagnosis Date   Anxiety    Depression    GERD (gastroesophageal reflux disease)    Hypothyroidism    IBS (irritable bowel syndrome)    Joint pain    Lactose intolerance    Obesity    Sleep apnea     Allergies  Allergen Reactions   Penicillins Anaphylaxis, Hives, Itching and Rash   Vyvanse [Lisdexamfetamine Dimesylate] Other (See Comments)    Tics     Review of Systems:  No headache, visual changes, nausea, vomiting, diarrhea, constipation, dizziness, abdominal pain, skin rash, fevers, chills, night sweats, weight loss, swollen lymph nodes, body aches, joint swelling, chest pain, shortness of breath, mood changes. POSITIVE muscle aches  Objective  Blood pressure 120/86, pulse 81, height 5\' 10"  (1.778 m), weight 190 lb (86.2 kg), SpO2 99 %.   General: No apparent distress alert and oriented x3 mood and affect normal, dressed appropriately.  HEENT: Pupils equal, extraocular movements intact   Respiratory: Patient's speak in full sentences and does not appear short of breath  Cardiovascular: No lower extremity edema, non tender, no erythema  Neck exam shows the patient does have significant tightness noted.  Some tenderness to palpation in the parascapular region as well as right greater than left.  Negative Spurling's.  Patient does have some tightness of the lower back still in the hip flexor tightness still noted right greater than left.  Negative straight leg test.  Osteopathic findings  C2 flexed rotated and side bent right C7 flexed rotated and side bent left T5 extended rotated and side bent right inhaled rib T9 extended rotated and side bent left L2 flexed rotated and side bent right Sacrum right on right       Assessment and Plan:  Scapular dyskinesis Patient is showing signs and symptoms consistent with more of a scapular dyskinesis.  We discussed posture and ergonomics.  Does respond well to osteopathic manipulation.  Patient still has some mild tightness of the hip flexors bilaterally right greater than left.  Patient encouraged to continue to work on the core strengthening as well.  Follow-up with me again in 6 weeks refilled patient's meloxicam   Nonallopathic problems  Decision today to treat with OMT was based on Physical Exam  After verbal consent patient was treated with HVLA, ME, FPR techniques in cervical, rib, thoracic, lumbar, and sacral  areas  Patient tolerated the procedure well with improvement in symptoms  Patient given exercises, stretches and  lifestyle modifications  See medications in patient instructions if given  Patient will follow up in 4-8 weeks      The above documentation has been reviewed and is accurate and complete Judi Saa, DO        Note: This dictation was prepared with Dragon dictation along with smaller phrase technology. Any transcriptional errors that result from this process are unintentional.

## 2021-03-16 ENCOUNTER — Encounter (INDEPENDENT_AMBULATORY_CARE_PROVIDER_SITE_OTHER): Payer: Self-pay | Admitting: Family Medicine

## 2021-03-16 ENCOUNTER — Other Ambulatory Visit (HOSPITAL_COMMUNITY): Payer: Self-pay

## 2021-03-16 ENCOUNTER — Other Ambulatory Visit: Payer: Self-pay

## 2021-03-16 ENCOUNTER — Ambulatory Visit (INDEPENDENT_AMBULATORY_CARE_PROVIDER_SITE_OTHER): Payer: 59 | Admitting: Family Medicine

## 2021-03-16 VITALS — BP 123/61 | HR 80 | Temp 98.1°F | Ht 70.0 in | Wt 190.0 lb

## 2021-03-16 DIAGNOSIS — R632 Polyphagia: Secondary | ICD-10-CM | POA: Diagnosis not present

## 2021-03-16 DIAGNOSIS — Z79899 Other long term (current) drug therapy: Secondary | ICD-10-CM | POA: Diagnosis not present

## 2021-03-16 DIAGNOSIS — F411 Generalized anxiety disorder: Secondary | ICD-10-CM | POA: Diagnosis not present

## 2021-03-16 DIAGNOSIS — Z6838 Body mass index (BMI) 38.0-38.9, adult: Secondary | ICD-10-CM

## 2021-03-16 MED ORDER — SAXENDA 18 MG/3ML ~~LOC~~ SOPN
3.0000 mg | PEN_INJECTOR | Freq: Every day | SUBCUTANEOUS | 0 refills | Status: DC
  Filled 2021-03-16: qty 15, 30d supply, fill #0

## 2021-03-16 MED ORDER — ESCITALOPRAM OXALATE 10 MG PO TABS
10.0000 mg | ORAL_TABLET | Freq: Every day | ORAL | 0 refills | Status: DC
  Filled 2021-03-16: qty 30, 30d supply, fill #0

## 2021-03-16 MED ORDER — TROKENDI XR 50 MG PO CP24
1.0000 | ORAL_CAPSULE | Freq: Every day | ORAL | 1 refills | Status: DC
  Filled 2021-03-16 – 2021-03-20 (×2): qty 30, 30d supply, fill #0

## 2021-03-17 NOTE — Progress Notes (Signed)
Chief Complaint:   OBESITY Kent Griffith is here to discuss his progress with his obesity treatment plan along with follow-up of his obesity related diagnoses.   Today's visit was #: 27 Starting weight: 270 lbs Starting date: 04/27/2019 Today's weight: 190 lbs Today's date: 03/16/2021 Weight change since last visit: 1 lb Total lbs lost to date: 80 lbs Body mass index is 27.26 kg/m.  Total weight loss percentage to date: -29.63%  Current Meal Plan: practicing portion control and making smarter food choices, such as increasing vegetables and decreasing simple carbohydrates for 50% of the time.  Current Exercise Plan: None. Current Anti-Obesity Medications: Mounjaro 7.5 mg subcutaneously weekly. Side effects: None.  Interim History:  [name] had some mid back pain and used an Aspercreme patch, then developed a drug rash.  He is also questioning if Mobic is causing him to have headaches.    Assessment/Plan:   1. Medication management Discontinue meloxicam.  Discontinue Mounjaro.  Plan:  Restart Saxenda.  Refill Trokendi 50 mg daily.  In 1 month, consider decreasing Trokendi if doing well.  - Refill Topiramate ER (TROKENDI XR) 50 MG CP24; Take 1 capsule by mouth daily.  Dispense: 30 capsule; Refill: 1 - Restart Liraglutide -Weight Management (SAXENDA) 18 MG/3ML SOPN; Inject 3 mg into the skin daily.  Dispense: 15 mL; Refill: 0  2. Polyphagia Controlled. Current treatment: Mounjaro 7.5 mg subcutaneously weekly. He will continue to focus on protein-rich, low simple carbohydrate foods. We reviewed the importance of hydration, regular exercise for stress reduction, and restorative sleep.  3. GAD (generalized anxiety disorder) [name] takes Wellbutrin XL 300 mg daily and Lexapro 10 mg daily.  Plan:  Continue medications.  Will refill Lexapro today.  - Refill escitalopram (LEXAPRO) 10 MG tablet; Take 1 tablet (10 mg total) by mouth daily.  Dispense: 30 tablet; Refill: 0  4. Obesity, current BMI  27.3  Course: [name] is currently in the action stage of change. As such, his goal is to continue with weight loss efforts.   Nutrition goals: He has agreed to Maintenance with 1700-1800 calories and 125 grams of protein, 30 grams of fiber, and <30 grams of carbohydrates per meal.   Exercise goals: All adults should avoid inactivity. Some physical activity is better than none, and adults who participate in any amount of physical activity gain some health benefits.  Behavioral modification strategies: planning for success.  [name] has agreed to follow-up with our clinic in 4 weeks. He was informed of the importance of frequent follow-up visits to maximize his success with intensive lifestyle modifications for his multiple health conditions.   Objective:   Blood pressure 123/61, pulse 80, temperature 98.1 F (36.7 C), temperature source Oral, height 5\' 10"  (1.778 m), weight 190 lb (86.2 kg), SpO2 96 %. Body mass index is 27.26 kg/m.  General: Cooperative, alert, well developed, in no acute distress. HEENT: Conjunctivae and lids unremarkable. Cardiovascular: Regular rhythm.  Lungs: Normal work of breathing. Neurologic: No focal deficits.   Lab Results  Component Value Date   CREATININE 1.16 01/27/2021   BUN 13 01/27/2021   NA 141 01/27/2021   K 4.0 01/27/2021   CL 102 01/27/2021   CO2 26 01/27/2021   Lab Results  Component Value Date   ALT 18 01/27/2021   AST 21 01/27/2021   ALKPHOS 68 01/27/2021   BILITOT 0.7 01/27/2021   Lab Results  Component Value Date   HGBA1C 4.8 05/19/2020   HGBA1C 4.9 05/07/2019   HGBA1C 5.0 07/12/2018  Lab Results  Component Value Date   INSULIN 2.5 (L) 01/27/2021   INSULIN 4.5 05/19/2020   INSULIN 12.3 05/07/2019   Lab Results  Component Value Date   TSH 4.150 12/11/2019   Lab Results  Component Value Date   CHOL 152 01/27/2021   HDL 56 01/27/2021   LDLCALC 79 01/27/2021   LDLDIRECT 99.0 07/12/2018   TRIG 94 01/27/2021   CHOLHDL 2.7  01/27/2021   Lab Results  Component Value Date   VD25OH 71.8 01/27/2021   VD25OH 82.8 05/19/2020   VD25OH 31.7 05/07/2019   Lab Results  Component Value Date   WBC 5.7 10/13/2020   HGB 13.3 10/13/2020   HCT 39.4 10/13/2020   MCV 85 10/13/2020   PLT 154 10/13/2020   Lab Results  Component Value Date   IRON 93 12/11/2019   TIBC 261 12/11/2019   FERRITIN 78 12/11/2019   Attestation Statements:   Reviewed by clinician on day of visit: allergies, medications, problem list, medical history, surgical history, family history, social history, and previous encounter notes.  I, Insurance claims handler, CMA, am acting as transcriptionist for Helane Rima, DO  I have reviewed the above documentation for accuracy and completeness, and I agree with the above. Helane Rima, DO

## 2021-03-20 ENCOUNTER — Other Ambulatory Visit (HOSPITAL_COMMUNITY): Payer: Self-pay

## 2021-03-22 ENCOUNTER — Other Ambulatory Visit (HOSPITAL_COMMUNITY): Payer: Self-pay

## 2021-04-12 ENCOUNTER — Other Ambulatory Visit: Payer: Self-pay

## 2021-04-12 ENCOUNTER — Ambulatory Visit (INDEPENDENT_AMBULATORY_CARE_PROVIDER_SITE_OTHER): Payer: 59 | Admitting: Family Medicine

## 2021-04-12 VITALS — BP 121/67 | HR 76 | Temp 98.0°F | Ht 70.0 in | Wt 201.0 lb

## 2021-04-12 DIAGNOSIS — F419 Anxiety disorder, unspecified: Secondary | ICD-10-CM

## 2021-04-12 DIAGNOSIS — Z6838 Body mass index (BMI) 38.0-38.9, adult: Secondary | ICD-10-CM

## 2021-04-12 DIAGNOSIS — Z9189 Other specified personal risk factors, not elsewhere classified: Secondary | ICD-10-CM

## 2021-04-12 DIAGNOSIS — R7301 Impaired fasting glucose: Secondary | ICD-10-CM | POA: Diagnosis not present

## 2021-04-12 DIAGNOSIS — F411 Generalized anxiety disorder: Secondary | ICD-10-CM

## 2021-04-12 DIAGNOSIS — K219 Gastro-esophageal reflux disease without esophagitis: Secondary | ICD-10-CM | POA: Diagnosis not present

## 2021-04-12 DIAGNOSIS — Z79899 Other long term (current) drug therapy: Secondary | ICD-10-CM

## 2021-04-13 ENCOUNTER — Ambulatory Visit (INDEPENDENT_AMBULATORY_CARE_PROVIDER_SITE_OTHER): Payer: 59 | Admitting: Family Medicine

## 2021-04-13 ENCOUNTER — Other Ambulatory Visit (HOSPITAL_COMMUNITY): Payer: Self-pay

## 2021-04-13 MED ORDER — ESCITALOPRAM OXALATE 10 MG PO TABS
10.0000 mg | ORAL_TABLET | Freq: Every day | ORAL | 3 refills | Status: DC
Start: 2021-04-13 — End: 2021-05-05
  Filled 2021-04-13: qty 90, 90d supply, fill #0

## 2021-04-13 MED ORDER — PHENTERMINE HCL 37.5 MG PO TABS
ORAL_TABLET | Freq: Every day | ORAL | 0 refills | Status: DC
  Filled 2021-04-13: qty 30, 30d supply, fill #0

## 2021-04-13 MED ORDER — OMEPRAZOLE 20 MG PO CPDR
20.0000 mg | DELAYED_RELEASE_CAPSULE | Freq: Every day | ORAL | 0 refills | Status: DC
Start: 2021-04-13 — End: 2021-06-29
  Filled 2021-04-13: qty 90, 90d supply, fill #0

## 2021-04-13 MED ORDER — BUPROPION HCL ER (XL) 300 MG PO TB24
300.0000 mg | ORAL_TABLET | Freq: Every day | ORAL | 0 refills | Status: DC
Start: 2021-04-13 — End: 2021-05-05
  Filled 2021-04-13: qty 90, 90d supply, fill #0

## 2021-04-13 MED ORDER — TIRZEPATIDE 5 MG/0.5ML ~~LOC~~ SOAJ
5.0000 mg | SUBCUTANEOUS | 0 refills | Status: DC
  Filled 2021-04-13: qty 6, 84d supply, fill #0

## 2021-04-13 NOTE — Progress Notes (Signed)
Chief Complaint:   OBESITY Kent Griffith is here to discuss his progress with his obesity treatment plan along with follow-up of his obesity related diagnoses.   Today's visit was #: 28 Starting weight: 270 lbs Starting date: 04/27/2019 Today's weight: 201 lbs Today's date: 04/12/2021 Weight change since last visit: +11 lbs Total lbs lost to date: 69 lbs Body mass index is 28.84 kg/m.  Total weight loss percentage to date: -25.56%  Current Meal Plan: keeping a food journal and adhering to recommended goals of 1700-1800 calories and 120 grams of protein, 30 grams of fiber, <30 grams of carbohydrates for 40% of the time.  Current Exercise Plan: None. Current Anti-Obesity Medications: Saxenda 3 mg daily . Side effects: None.  Interim History:  Kent Griffith endorses increased polyphagia with Saxenda.  He would like to try [facility].  Did say okay to use phentermine if needed (still has 37.5 mg tablets at home).  Assessment/Plan:   1. Impaired fasting glucose, with polyphagia Not at goal. Current treatment: Saxenda 3 mg daily. He will continue to focus on protein-rich, low simple carbohydrate foods. We reviewed the importance of hydration, regular exercise for stress reduction, and restorative sleep.  Plan:  Start Mounjaro 5 mg subcutaneously weekly, as per below.  - Start tirzepatide [facility]) 5 MG/0.5ML Pen; Inject 5 mg into the skin once a week.  Dispense: 6 mL; Refill: 0  2. Gastroesophageal reflux disease without esophagitis Kent Griffith is taking omeprazole 20 mg daily for GERD symptoms.  Non-pharmacologic management of GERD symptoms: including: caffeine reduction, dietary changes, elevate HOB, NPO after supper, reduction of alcohol intake, tobacco cessation, and weight loss.  - Refill omeprazole (PRILOSEC) 20 MG capsule; Take 1 capsule (20 mg total) by mouth daily.  Dispense: 90 capsule; Refill: 0  3. Anxiety, with emotional eating Medication: Wellbutrin XL 300 mg daily, Lexapro 10 mg daily,  phentermine 37.5 mg daily.  Plan:  Discussed cues and consequences, how thoughts affect eating, model of thoughts, feelings, and behaviors, and strategies for change by focusing on the cue. Discussed cognitive distortions, coping thoughts, and how to change your thoughts.  - Refill buPROPion (WELLBUTRIN XL) 300 MG 24 hr tablet; Take 1 tablet (300 mg total) by mouth daily.  Dispense: 90 tablet; Refill: 0 - Refill escitalopram (LEXAPRO) 10 MG tablet; Take 1 tablet (10 mg total) by mouth daily.  Dispense: 90 tablet; Refill: 3 - Refill phentermine (ADIPEX-P) 37.5 MG tablet; TAKE 1 TABLET (37.5 MG TOTAL) BY MOUTH DAILY BEFORE BREAKFAST.  Dispense: 30 tablet; Refill: 0  Having again reminded the patient of the "off label" use of Phentermine beyond three consecutive months, and again discussing the risks, benefits, contraindications, and limitations of it's use; given it's role in the successful treatment of obesity thus far and lack of adverse effect, patient has expressed desire and given informed verbal consent to continue use.   I have consulted the [place] Controlled Substances Registry for this patient, and feel the risk/benefit ratio today is favorable for proceeding with this prescription for a controlled substance. The patient understands monitoring parameters and red flags.   4. Obesity, current BMI 28.8  Course: Kent Griffith is currently in the action stage of change. As such, his goal is to continue with weight loss efforts.   Nutrition goals: He has agreed to keeping a food journal and adhering to recommended goals of 1700-1800 calories and 125 grams of protein, 30 grams of fiber, <30 grams of carbohydrates.   Exercise goals: For substantial health benefits, adults  should do at least 150 minutes (2 hours and 30 minutes) a week of moderate-intensity, or 75 minutes (1 hour and 15 minutes) a week of vigorous-intensity aerobic physical activity, or an equivalent combination of moderate- and vigorous-intensity  aerobic activity. Aerobic activity should be performed in episodes of at least 10 minutes, and preferably, it should be spread throughout the week.  Behavioral modification strategies: increasing lean protein intake, decreasing simple carbohydrates, increasing vegetables, and increasing water intake.  Kent Griffith has agreed to follow-up with our clinic in 4 weeks. He was informed of the importance of frequent follow-up visits to maximize his success with intensive lifestyle modifications for his multiple health conditions.   Objective:   Blood pressure 121/67, pulse 76, temperature 98 F (36.7 C), temperature source Oral, height 5\' 10"  (1.778 m), weight 201 lb (91.2 kg), SpO2 99 %. Body mass index is 28.84 kg/m.  General: Cooperative, alert, well developed, in no acute distress. HEENT: Conjunctivae and lids unremarkable. Cardiovascular: Regular rhythm.  Lungs: Normal work of breathing. Neurologic: No focal deficits.   Lab Results  Component Value Date   CREATININE 1.16 01/27/2021   BUN 13 01/27/2021   NA 141 01/27/2021   K 4.0 01/27/2021   CL 102 01/27/2021   CO2 26 01/27/2021   Lab Results  Component Value Date   ALT 18 01/27/2021   AST 21 01/27/2021   ALKPHOS 68 01/27/2021   BILITOT 0.7 01/27/2021   Lab Results  Component Value Date   HGBA1C 4.8 05/19/2020   HGBA1C 4.9 05/07/2019   HGBA1C 5.0 07/12/2018   Lab Results  Component Value Date   INSULIN 2.5 (L) 01/27/2021   INSULIN 4.5 05/19/2020   INSULIN 12.3 05/07/2019   Lab Results  Component Value Date   TSH 4.150 12/11/2019   Lab Results  Component Value Date   CHOL 152 01/27/2021   HDL 56 01/27/2021   LDLCALC 79 01/27/2021   LDLDIRECT 99.0 07/12/2018   TRIG 94 01/27/2021   CHOLHDL 2.7 01/27/2021   Lab Results  Component Value Date   VD25OH 71.8 01/27/2021   VD25OH 82.8 05/19/2020   VD25OH 31.7 05/07/2019   Lab Results  Component Value Date   WBC 5.7 10/13/2020   HGB 13.3 10/13/2020   HCT 39.4  10/13/2020   MCV 85 10/13/2020   PLT 154 10/13/2020   Lab Results  Component Value Date   IRON 93 12/11/2019   TIBC 261 12/11/2019   FERRITIN 78 12/11/2019   Attestation Statements:   Reviewed by clinician on day of visit: allergies, medications, problem list, medical history, surgical history, family history, social history, and previous encounter notes.  I, 02/10/2020, CMA, am acting as transcriptionist for Insurance claims handler, DO  I have reviewed the above documentation for accuracy and completeness, and I agree with the above. Helane Rima, DO

## 2021-04-15 NOTE — Progress Notes (Signed)
[facility] [address] [place] [address] Phone: [phone] Subjective:   INadine Counts, am serving as a scribe for Dr. Antoine Primas. This visit occurred during the SARS-CoV-2 public health emergency.  Safety protocols were in place, including screening questions prior to the visit, additional usage of staff PPE, and extensive cleaning of exam room while observing appropriate contact time as indicated for disinfecting solutions.   I'm seeing this patient by the request  of:  Ardith Dark, MD  CC: back and neck pain   [id number]  Andrell Bergeson is a 25 y.o. male coming in with complaint of back and neck pain. OMT 03/04/2021. Patient states neck is feeling better. Stopped taking meloxicam 5 weeks ago because provider said it could be the cause of his headaches.  Patient states is more of a tightness noted.  Nothing severe that is stopping him from activity but sometimes it does give him difficulty.  Has been doing the exercises occasionally.  Medications patient has been prescribed: None       Reviewed prior external information including notes and imaging from previsou exam, outside providers and external EMR if available.   As well as notes that were available from care everywhere and other healthcare systems.  Past medical history, social, surgical and family history all reviewed in electronic medical record.  No pertanent information unless stated regarding to the chief complaint.   Past Medical History:  Diagnosis Date   Anxiety    Depression    GERD (gastroesophageal reflux disease)    Hypothyroidism    IBS (irritable bowel syndrome)    Joint pain    Lactose intolerance    Obesity    Sleep apnea     Allergies  Allergen Reactions   Penicillins Anaphylaxis, Hives, Itching and Rash   Vyvanse [Lisdexamfetamine Dimesylate] Other (See Comments)    Tics     Review of Systems:  No headache, visual changes, nausea, vomiting,  diarrhea, constipation, dizziness, abdominal pain, skin rash, fevers, chills, night sweats, weight loss, swollen lymph nodes, body aches, joint swelling, chest pain, shortness of breath, mood changes. POSITIVE muscle aches  Objective  Blood pressure 106/68, pulse 77, height 5\' 10"  (1.778 m), weight 203 lb (92.1 kg), SpO2 98 %.   General: No apparent distress alert and oriented x3 mood and affect normal, dressed appropriately.  HEENT: Pupils equal, extraocular movements intact  Respiratory: Patient's speak in full sentences and does not appear short of breath  Cardiovascular: No lower extremity edema, non tender, no erythema  Patient does have some still scapular dyskinesis) left.  Patient does have some limited range of motion of the right shoulder compared to the contralateral side.  Very mild positive impingement noted.  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  Patient does not taking any significant medications at this time.  Discussed home exercises and icing regimen.  Increase activity slowly.  We will follow-up with me in 6 to 8 weeks.   Nonallopathic problems  Decision today to treat with OMT was based on Physical Exam  After verbal consent patient was treated with HVLA, ME, FPR techniques in cervical, rib, thoracic, lumbar, and sacral  areas  Patient  tolerated the procedure well with improvement in symptoms  Patient given exercises, stretches and lifestyle modifications  See medications in patient instructions if given  Patient will follow up in 4-8 weeks     The above documentation has been reviewed and is accurate and complete Judi Saa, DO        Note: This dictation was  prepared with Dragon dictation along with smaller phrase technology. Any transcriptional errors that result from this process are unintentional.

## 2021-04-16 ENCOUNTER — Other Ambulatory Visit (HOSPITAL_COMMUNITY): Payer: Self-pay

## 2021-04-16 ENCOUNTER — Ambulatory Visit: Payer: 59 | Admitting: Family Medicine

## 2021-04-16 ENCOUNTER — Other Ambulatory Visit: Payer: Self-pay

## 2021-04-16 VITALS — BP 106/68 | HR 77 | Ht 70.0 in | Wt 203.0 lb

## 2021-04-16 DIAGNOSIS — G2589 Other specified extrapyramidal and movement disorders: Secondary | ICD-10-CM | POA: Diagnosis not present

## 2021-04-16 DIAGNOSIS — M9903 Segmental and somatic dysfunction of lumbar region: Secondary | ICD-10-CM

## 2021-04-16 DIAGNOSIS — M9902 Segmental and somatic dysfunction of thoracic region: Secondary | ICD-10-CM | POA: Diagnosis not present

## 2021-04-16 DIAGNOSIS — M9904 Segmental and somatic dysfunction of sacral region: Secondary | ICD-10-CM

## 2021-04-16 DIAGNOSIS — M9908 Segmental and somatic dysfunction of rib cage: Secondary | ICD-10-CM

## 2021-04-16 DIAGNOSIS — M9901 Segmental and somatic dysfunction of cervical region: Secondary | ICD-10-CM | POA: Diagnosis not present

## 2021-04-16 NOTE — Patient Instructions (Signed)
Good to see you! Keep working on posture See you again in 6 weeks

## 2021-04-16 NOTE — Assessment & Plan Note (Signed)
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  Patient does not taking any significant medications at this time.  Discussed home exercises and icing regimen.  Increase activity slowly.  We will follow-up with me in 6 to 8 weeks.

## 2021-04-27 ENCOUNTER — Other Ambulatory Visit (INDEPENDENT_AMBULATORY_CARE_PROVIDER_SITE_OTHER): Payer: Self-pay

## 2021-04-27 ENCOUNTER — Other Ambulatory Visit (HOSPITAL_COMMUNITY): Payer: Self-pay

## 2021-04-27 MED ORDER — ALBUTEROL SULFATE HFA 108 (90 BASE) MCG/ACT IN AERS
2.0000 | INHALATION_SPRAY | Freq: Four times a day (QID) | RESPIRATORY_TRACT | 0 refills | Status: DC | PRN
  Filled 2021-04-27: qty 8.5, 25d supply, fill #0

## 2021-04-29 ENCOUNTER — Other Ambulatory Visit (INDEPENDENT_AMBULATORY_CARE_PROVIDER_SITE_OTHER): Payer: Self-pay | Admitting: Family Medicine

## 2021-04-29 ENCOUNTER — Other Ambulatory Visit (HOSPITAL_COMMUNITY): Payer: Self-pay

## 2021-04-29 MED ORDER — BUDESONIDE-FORMOTEROL FUMARATE 160-4.5 MCG/ACT IN AERO
2.0000 | INHALATION_SPRAY | Freq: Two times a day (BID) | RESPIRATORY_TRACT | 12 refills | Status: DC
  Filled 2021-04-29: qty 10.2, 30d supply, fill #0

## 2021-04-30 ENCOUNTER — Other Ambulatory Visit (HOSPITAL_BASED_OUTPATIENT_CLINIC_OR_DEPARTMENT_OTHER): Payer: Self-pay

## 2021-04-30 MED ORDER — INFLUENZA VAC SPLIT QUAD 0.5 ML IM SUSY
PREFILLED_SYRINGE | INTRAMUSCULAR | 0 refills | Status: DC
Start: 2021-04-30 — End: 2021-05-05
  Filled 2021-04-30: qty 0.5, 1d supply, fill #0

## 2021-05-02 ENCOUNTER — Encounter (INDEPENDENT_AMBULATORY_CARE_PROVIDER_SITE_OTHER): Payer: Self-pay | Admitting: Family Medicine

## 2021-05-05 ENCOUNTER — Other Ambulatory Visit: Payer: Self-pay

## 2021-05-05 ENCOUNTER — Ambulatory Visit (INDEPENDENT_AMBULATORY_CARE_PROVIDER_SITE_OTHER): Payer: 59 | Admitting: Family Medicine

## 2021-05-05 VITALS — BP 105/67 | HR 73 | Temp 98.4°F | Ht 70.0 in | Wt 190.0 lb

## 2021-05-05 DIAGNOSIS — F419 Anxiety disorder, unspecified: Secondary | ICD-10-CM | POA: Diagnosis not present

## 2021-05-05 DIAGNOSIS — F5081 Binge eating disorder: Secondary | ICD-10-CM | POA: Diagnosis not present

## 2021-05-05 DIAGNOSIS — R632 Polyphagia: Secondary | ICD-10-CM | POA: Diagnosis not present

## 2021-05-05 DIAGNOSIS — Z6838 Body mass index (BMI) 38.0-38.9, adult: Secondary | ICD-10-CM

## 2021-05-10 ENCOUNTER — Other Ambulatory Visit (HOSPITAL_COMMUNITY): Payer: Self-pay

## 2021-05-10 MED ORDER — BUPROPION HCL ER (XL) 300 MG PO TB24
300.0000 mg | ORAL_TABLET | Freq: Every day | ORAL | 0 refills | Status: DC
Start: 2021-05-10 — End: 2021-06-29
  Filled 2021-05-10: qty 90, 90d supply, fill #0

## 2021-05-10 MED ORDER — VYVANSE 20 MG PO CHEW
20.0000 mg | CHEWABLE_TABLET | Freq: Every morning | ORAL | 0 refills | Status: DC
Start: 2021-05-10 — End: 2021-05-24
  Filled 2021-05-10: qty 30, 30d supply, fill #0

## 2021-05-10 MED ORDER — PHENTERMINE HCL 37.5 MG PO TABS
ORAL_TABLET | Freq: Every day | ORAL | 0 refills | Status: DC
Start: 2021-05-10 — End: 2021-05-10
  Filled 2021-05-10: qty 30, fill #0

## 2021-05-10 MED ORDER — ESCITALOPRAM OXALATE 10 MG PO TABS
10.0000 mg | ORAL_TABLET | Freq: Every day | ORAL | 3 refills | Status: DC
Start: 2021-05-10 — End: 2021-10-06
  Filled 2021-05-10 – 2021-07-18 (×2): qty 90, 90d supply, fill #0

## 2021-05-10 NOTE — Progress Notes (Signed)
Chief Complaint:   OBESITY Jagar is here to discuss his progress with his obesity treatment plan along with follow-up of his obesity related diagnoses. See Medical Weight Management Flowsheet for complete bioelectrical impedance results.  Today's visit was #: 29 Starting weight: 270 lbs Starting date: 04/27/2019 Weight change since last visit: 11 lbs Total lbs lost to date: 80 lbs Total weight loss percentage to date: -29.63%  Nutrition Plan: Practicing portion control and making smarter food choices, such as increasing vegetables and decreasing simple carbohydrates for 100% of the time. Activity: None. Anti-obesity medications: phentermine 37.5 mg daily. Reported side effects: None.  Interim History: Cayleb says that he has not had Allegra for 2 days, which has affected his breathing.  He has only used Symbicort once.  Assessment/Plan:   1. Polyphagia Controlled. Current treatment: phentermine 37.5 mg subcutaneously weekly.    Plan: He will continue to focus on protein-rich, low simple carbohydrate foods. We reviewed the importance of hydration, regular exercise for stress reduction, and restorative sleep.  2. Binge eating disorder Start Vyvanse 20 mg daily, as per below.  The goals for treatment of BED are to reduce eating binges and to achieve healthy eating habits. Because binge eating can correlate with negative emotions, treatment may also address any other mental-health issues, such as depression.  People who binge eat feel as if they don't have control over how much they eat and have feelings of guilt or self-loathing after a binge eating episode.  The FDA has approved Vyvanse as a treatment option for binge eating disorder. Vyvanse targets the brain's reward center by increasing the levels of dopamine and norepinephrine, the chemicals of the brain responsible for feelings of pleasure.  Mindful eating is the recommended nutritional approach to treating BED.   - Start  Lisdexamfetamine Dimesylate (VYVANSE) 20 MG CHEW; Chew 1 tablet (20 mg) by mouth every morning.  Dispense: 30 tablet; Refill: 0  I have consulted the [place] Controlled Substances Registry for this patient, and feel the risk/benefit ratio today is favorable for proceeding with this prescription for a controlled substance. The patient understands monitoring parameters and red flags.   3. Anxiety, with emotional eating Controlled. Medication: Lexapro 10 mg daily and Wellbutrin XL 300 mg daily.   Plan:  Continue Lexapro and Wellbutrin. Discussed cues and consequences, how thoughts affect eating, model of thoughts, feelings, and behaviors, and strategies for change by focusing on the cue. Discussed cognitive distortions, coping thoughts, and how to change your thoughts.  - Refill escitalopram (LEXAPRO) 10 MG tablet; Take 1 tablet (10 mg total) by mouth daily.  Dispense: 90 tablet; Refill: 3 - Refill buPROPion (WELLBUTRIN XL) 300 MG 24 hr tablet; Take 1 tablet (300 mg total) by mouth daily.  Dispense: 90 tablet; Refill: 0  4. Obesity, current BMI 27.3  Course: Darelle is currently in the action stage of change. As such, his goal is to continue with weight loss efforts.   Nutrition goals: He has agreed to practicing portion control and making smarter food choices, such as increasing vegetables and decreasing simple carbohydrates.   Exercise goals: For substantial health benefits, adults should do at least 150 minutes (2 hours and 30 minutes) a week of moderate-intensity, or 75 minutes (1 hour and 15 minutes) a week of vigorous-intensity aerobic physical activity, or an equivalent combination of moderate- and vigorous-intensity aerobic activity. Aerobic activity should be performed in episodes of at least 10 minutes, and preferably, it should be spread throughout the week.  Behavioral modification strategies: increasing lean protein intake, decreasing simple carbohydrates, increasing vegetables, increasing  water intake, and emotional eating strategies.  Zahid has agreed to follow-up with our clinic in 4 weeks. He was informed of the importance of frequent follow-up visits to maximize his success with intensive lifestyle modifications for his multiple health conditions.   Objective:   Blood pressure 105/67, pulse 73, temperature 98.4 F (36.9 C), temperature source Oral, height 5\' 10"  (1.778 m), weight 190 lb (86.2 kg), SpO2 100 %. Body mass index is 27.26 kg/m.  General: Cooperative, alert, well developed, in no acute distress. HEENT: Conjunctivae and lids unremarkable. Cardiovascular: Regular rhythm.  Lungs: Normal work of breathing. Neurologic: No focal deficits.   Lab Results  Component Value Date   CREATININE 1.16 01/27/2021   BUN 13 01/27/2021   NA 141 01/27/2021   K 4.0 01/27/2021   CL 102 01/27/2021   CO2 26 01/27/2021   Lab Results  Component Value Date   ALT 18 01/27/2021   AST 21 01/27/2021   ALKPHOS 68 01/27/2021   BILITOT 0.7 01/27/2021   Lab Results  Component Value Date   HGBA1C 4.8 05/19/2020   HGBA1C 4.9 05/07/2019   HGBA1C 5.0 07/12/2018   Lab Results  Component Value Date   INSULIN 2.5 (L) 01/27/2021   INSULIN 4.5 05/19/2020   INSULIN 12.3 05/07/2019   Lab Results  Component Value Date   TSH 4.150 12/11/2019   Lab Results  Component Value Date   CHOL 152 01/27/2021   HDL 56 01/27/2021   LDLCALC 79 01/27/2021   LDLDIRECT 99.0 07/12/2018   TRIG 94 01/27/2021   CHOLHDL 2.7 01/27/2021   Lab Results  Component Value Date   VD25OH 71.8 01/27/2021   VD25OH 82.8 05/19/2020   VD25OH 31.7 05/07/2019   Lab Results  Component Value Date   WBC 5.7 10/13/2020   HGB 13.3 10/13/2020   HCT 39.4 10/13/2020   MCV 85 10/13/2020   PLT 154 10/13/2020   Lab Results  Component Value Date   IRON 93 12/11/2019   TIBC 261 12/11/2019   FERRITIN 78 12/11/2019   Attestation Statements:   Reviewed by clinician on day of visit: allergies, medications,  problem list, medical history, surgical history, family history, social history, and previous encounter notes.  Time spent on visit including pre-visit chart review and post-visit care and documentation was 36 minutes.   I, 02/10/2020, CMA, am acting as transcriptionist for Insurance claims handler, DO  I have reviewed the above documentation for accuracy and completeness, and I agree with the above. -  Helane Rima, DO, MS, FAAFP, DABOM - Family and Bariatric Medicine.

## 2021-05-24 ENCOUNTER — Other Ambulatory Visit (HOSPITAL_COMMUNITY): Payer: Self-pay

## 2021-05-24 ENCOUNTER — Other Ambulatory Visit (INDEPENDENT_AMBULATORY_CARE_PROVIDER_SITE_OTHER): Payer: Self-pay | Admitting: Family Medicine

## 2021-05-24 MED ORDER — TIRZEPATIDE 7.5 MG/0.5ML ~~LOC~~ SOAJ
7.5000 mg | SUBCUTANEOUS | 1 refills | Status: DC
  Filled 2021-05-24: qty 6, 84d supply, fill #0

## 2021-05-24 MED ORDER — LISDEXAMFETAMINE DIMESYLATE 40 MG PO CAPS
40.0000 mg | ORAL_CAPSULE | ORAL | 0 refills | Status: DC
  Filled 2021-05-24: qty 30, 30d supply, fill #0

## 2021-05-28 ENCOUNTER — Ambulatory Visit: Payer: 59 | Admitting: Family Medicine

## 2021-05-28 ENCOUNTER — Encounter (INDEPENDENT_AMBULATORY_CARE_PROVIDER_SITE_OTHER): Payer: Self-pay | Admitting: Family Medicine

## 2021-06-07 ENCOUNTER — Other Ambulatory Visit: Payer: Self-pay

## 2021-06-07 ENCOUNTER — Ambulatory Visit (INDEPENDENT_AMBULATORY_CARE_PROVIDER_SITE_OTHER): Payer: 59 | Admitting: Family Medicine

## 2021-06-07 ENCOUNTER — Encounter (INDEPENDENT_AMBULATORY_CARE_PROVIDER_SITE_OTHER): Payer: Self-pay | Admitting: Family Medicine

## 2021-06-07 ENCOUNTER — Other Ambulatory Visit (HOSPITAL_COMMUNITY): Payer: Self-pay

## 2021-06-07 VITALS — BP 110/70 | HR 90 | Temp 97.8°F | Ht 70.0 in | Wt 191.0 lb

## 2021-06-07 DIAGNOSIS — Z6838 Body mass index (BMI) 38.0-38.9, adult: Secondary | ICD-10-CM | POA: Diagnosis not present

## 2021-06-07 DIAGNOSIS — F5081 Binge eating disorder: Secondary | ICD-10-CM | POA: Diagnosis not present

## 2021-06-07 DIAGNOSIS — K5903 Drug induced constipation: Secondary | ICD-10-CM

## 2021-06-07 DIAGNOSIS — R632 Polyphagia: Secondary | ICD-10-CM

## 2021-06-07 MED ORDER — VYVANSE 60 MG PO CHEW
60.0000 mg | CHEWABLE_TABLET | Freq: Every morning | ORAL | 0 refills | Status: DC
  Filled 2021-06-07: qty 30, 30d supply, fill #0

## 2021-06-08 NOTE — Progress Notes (Addendum)
Chief Complaint:   OBESITY Kent Griffith is here to discuss his progress with his obesity treatment plan along with follow-up of his obesity related diagnoses. See Medical Weight Management Flowsheet for complete bioelectrical impedance results.  Today's visit was #: 30 Starting weight: 270 lbs Starting date: 04/27/2019 Weight change since last visit: +1 lb Total lbs lost to date: 79 lbs Total weight loss percentage to date: -29.26%  Nutrition Plan: Practicing portion control and making smarter food choices, such as increasing vegetables and decreasing simple carbohydrates for 60% of the time.  Activity: None. Anti-obesity medications: Mounjaro 7.5 mg subcutaneously weekly. Reported side effects: None.  Interim History: Kent Griffith says he is doing well.  He says he is still having sweets cravings.  Assessment/Plan:   1. Polyphagia Controlled. Current treatment: None.    Plan: He will continue to focus on protein-rich, low simple carbohydrate foods. We reviewed the importance of hydration, regular exercise for stress reduction, and restorative sleep.  2. Drug-induced constipation This problem is controlled with Linzess 145 mcg daily. The current medical regimen is effective;  continue present plan and medications.  3. Binge eating disorder Kent Griffith is taking Vyvanse 40 mg daily for BED.  He endorses cravings for sweets.  Plan:  Increase Vyvanse to 60 mg daily.  Will send in new prescription today, as per below.  The goals for treatment of BED are to reduce eating binges and to achieve healthy eating habits. Because binge eating can correlate with negative emotions, treatment may also address any other mental-health issues, such as depression. People who binge eat feel as if they don't have control over how much they eat and have feelings of guilt or self-loathing after a binge eating episode.  The FDA has approved Vyvanse as a treatment option for binge eating disorder. Vyvanse targets the  brain's reward center by increasing the levels of dopamine and norepinephrine, the chemicals of the brain responsible for feelings of pleasure.  Mindful eating is the recommended nutritional approach to treating BED.   - Refill Lisdexamfetamine Dimesylate (VYVANSE) 60 MG CHEW; Chew 1 tablet (60 mg) by mouth every morning.  Dispense: 30 tablet; Refill: 0  I have consulted the [place] Controlled Substances Registry for this patient, and feel the risk/benefit ratio today is favorable for proceeding with this prescription for a controlled substance. The patient understands monitoring parameters and red flags.   4. Obesity BMI today is 27  Course: Kent Griffith is currently in the action stage of change. As such, his goal is to continue with weight loss efforts.   Nutrition goals: He has agreed to practicing portion control and making smarter food choices, such as increasing vegetables and decreasing simple carbohydrates.   Exercise goals: For substantial health benefits, adults should do at least 150 minutes (2 hours and 30 minutes) a week of moderate-intensity, or 75 minutes (1 hour and 15 minutes) a week of vigorous-intensity aerobic physical activity, or an equivalent combination of moderate- and vigorous-intensity aerobic activity. Aerobic activity should be performed in episodes of at least 10 minutes, and preferably, it should be spread throughout the week.  Behavioral modification strategies: increasing lean protein intake, decreasing simple carbohydrates, and increasing vegetables.  Kent Griffith has agreed to follow-up with our clinic in 4 weeks. He was informed of the importance of frequent follow-up visits to maximize his success with intensive lifestyle modifications for his multiple health conditions.   Objective:   Blood pressure 110/70, pulse 90, temperature 97.8 F (36.6 C), temperature source Oral, height  5\' 10"  (1.778 m), weight 191 lb (86.6 kg), SpO2 100 %. Body mass index is 27.41 kg/m.  General:  Cooperative, alert, well developed, in no acute distress. HEENT: Conjunctivae and lids unremarkable. Cardiovascular: Regular rhythm.  Lungs: Normal work of breathing. Neurologic: No focal deficits.   Lab Results  Component Value Date   CREATININE 1.16 01/27/2021   BUN 13 01/27/2021   NA 141 01/27/2021   K 4.0 01/27/2021   CL 102 01/27/2021   CO2 26 01/27/2021   Lab Results  Component Value Date   ALT 18 01/27/2021   AST 21 01/27/2021   ALKPHOS 68 01/27/2021   BILITOT 0.7 01/27/2021   Lab Results  Component Value Date   HGBA1C 4.8 05/19/2020   HGBA1C 4.9 05/07/2019   HGBA1C 5.0 07/12/2018   Lab Results  Component Value Date   INSULIN 2.5 (L) 01/27/2021   INSULIN 4.5 05/19/2020   INSULIN 12.3 05/07/2019   Lab Results  Component Value Date   TSH 4.150 12/11/2019   Lab Results  Component Value Date   CHOL 152 01/27/2021   HDL 56 01/27/2021   LDLCALC 79 01/27/2021   LDLDIRECT 99.0 07/12/2018   TRIG 94 01/27/2021   CHOLHDL 2.7 01/27/2021   Lab Results  Component Value Date   VD25OH 71.8 01/27/2021   VD25OH 82.8 05/19/2020   VD25OH 31.7 05/07/2019   Lab Results  Component Value Date   WBC 5.7 10/13/2020   HGB 13.3 10/13/2020   HCT 39.4 10/13/2020   MCV 85 10/13/2020   PLT 154 10/13/2020   Lab Results  Component Value Date   IRON 93 12/11/2019   TIBC 261 12/11/2019   FERRITIN 78 12/11/2019   Attestation Statements:   Reviewed by clinician on day of visit: allergies, medications, problem list, medical history, surgical history, family history, social history, and previous encounter notes.  I, 02/10/2020, CMA, am acting as transcriptionist for Insurance claims handler, DO  I have reviewed the above documentation for accuracy and completeness, and I agree with the above. -  Helane Rima, DO, MS, FAAFP, DABOM - Family and Bariatric Medicine.

## 2021-06-10 NOTE — Progress Notes (Signed)
Aleen Sells D.[facility] [address] [place] [address] Phone: [phone]   Assessment and Plan:     1. Chronic bilateral thoracic back pain 2. Somatic dysfunction of cervical region 3. Somatic dysfunction of thoracic region 4. Somatic dysfunction of lumbar region 5. Somatic dysfunction of pelvic region 6. Somatic dysfunction of rib region -Chronic with exacerbation, subsequent visit - Recurrence of multiple musculoskeletal complaints with most prominent being in mid thoracic spine and low back - No red flag symptoms so no additional imaging taken today - Patient has received significant relief with OMT in the past.  Elects for repeat OMT today.  Tolerated well per note below. - Decision today to treat with OMT was based on Physical Exam   After verbal consent patient was treated with HVLA (high velocity low amplitude), ME (muscle energy), FPR (flex positional release), ST (soft tissue), PC/PD (Pelvic Compression/ Pelvic Decompression) techniques in cervical, rib, thoracic, lumbar, and pelvic areas. Patient tolerated the procedure well with improvement in symptoms.  Patient educated on potential side effects of soreness and recommended to rest, hydrate, and use Tylenol as needed for pain control.   Pertinent previous records reviewed include none   Follow Up: 4 to 6 weeks for repeat OMT   Subjective:   I, Debbe Odea, am serving as a scribe for Dr. Richardean Sale  Chief Complaint: Neck and back pain  HPI:    06/11/21 Patient is a 25 year old male presenting with neck and back pain. Patient was last seen by Dr. Katrinka Blazing on 04/16/21 for this reason and had OMT. Today patient states  that lower back and shoulder blade in the middle are bugging him today   Relevant Historical Information: GERD  Additional pertinent review of systems negative.  Current Outpatient Medications  Medication Sig Dispense Refill   albuterol (VENTOLIN HFA) 108 (90  Base) MCG/ACT inhaler Inhale 2 puffs into the lungs every 6 (six) hours as needed for wheezing or shortness of breath. 8.5 g 0   budesonide-formoterol (SYMBICORT) 160-4.5 MCG/ACT inhaler Inhale 2 puffs into the lungs 2 (two) times daily. 10.2 g 12   buPROPion (WELLBUTRIN XL) 300 MG 24 hr tablet Take 1 tablet (300 mg total) by mouth daily. 90 tablet 0   escitalopram (LEXAPRO) 10 MG tablet Take 1 tablet (10 mg total) by mouth daily. 90 tablet 3   linaclotide (LINZESS) 145 MCG CAPS capsule Take 1 capsule (145 mcg total) by mouth daily as needed. 30 capsule 0   lisdexamfetamine (VYVANSE) 40 MG capsule Take 1 capsule (40 mg total) by mouth every morning. 30 capsule 0   Lisdexamfetamine Dimesylate (VYVANSE) 60 MG CHEW Chew 1 tablet (60 mg) by mouth every morning. 30 tablet 0   omeprazole (PRILOSEC) 20 MG capsule Take 1 capsule (20 mg total) by mouth daily. 90 capsule 0   tirzepatide (MOUNJARO) 7.5 MG/0.5ML Pen Inject 7.5 mg into the skin once a week. 6 mL 1   No current facility-administered medications for this visit.      Objective:     Vitals:   06/11/21 0803  BP: 110/80  Pulse: 73  SpO2: 96%  Weight: 195 lb (88.5 kg)  Height: 5\' 10"  (1.778 m)      Body mass index is 27.98 kg/m.    Physical Exam:     General: Well-appearing, cooperative, sitting comfortably in no acute distress.   OMT Physical Exam:  ASIS Compression Test: Positive Right Cervical: TTP paraspinal, C3 RRSL, C6 RLSR  Rib: Bilateral elevated first rib with TTP, worse on left Thoracic: TTP paraspinal, T3-5 RLSR, T7-11 RRSL Lumbar: TTP paraspinal, L1-3 RLSR Pelvis: Right posterior innominate  Electronically signed by:  Aleen Sells D.[facility] 8:22 AM 06/11/21

## 2021-06-11 ENCOUNTER — Ambulatory Visit (INDEPENDENT_AMBULATORY_CARE_PROVIDER_SITE_OTHER)
Admission: RE | Admit: 2021-06-11 | Discharge: 2021-06-11 | Disposition: A | Discharge status: Home / Self Care | Payer: 59 | Source: Ambulatory Visit | Attending: Family Medicine | Admitting: Family Medicine

## 2021-06-11 ENCOUNTER — Ambulatory Visit: Payer: 59 | Admitting: Family Medicine

## 2021-06-11 ENCOUNTER — Other Ambulatory Visit: Payer: Self-pay

## 2021-06-11 ENCOUNTER — Ambulatory Visit (INDEPENDENT_AMBULATORY_CARE_PROVIDER_SITE_OTHER): Payer: 59 | Admitting: Sports Medicine

## 2021-06-11 ENCOUNTER — Encounter: Payer: Self-pay | Admitting: Family Medicine

## 2021-06-11 VITALS — BP 120/70 | HR 77 | Temp 98.0°F | Ht 70.0 in | Wt 196.1 lb

## 2021-06-11 VITALS — BP 110/80 | HR 73 | Ht 70.0 in | Wt 195.0 lb

## 2021-06-11 DIAGNOSIS — M9905 Segmental and somatic dysfunction of pelvic region: Secondary | ICD-10-CM

## 2021-06-11 DIAGNOSIS — M9903 Segmental and somatic dysfunction of lumbar region: Secondary | ICD-10-CM

## 2021-06-11 DIAGNOSIS — M9901 Segmental and somatic dysfunction of cervical region: Secondary | ICD-10-CM | POA: Diagnosis not present

## 2021-06-11 DIAGNOSIS — R002 Palpitations: Secondary | ICD-10-CM

## 2021-06-11 DIAGNOSIS — G8929 Other chronic pain: Secondary | ICD-10-CM | POA: Diagnosis not present

## 2021-06-11 DIAGNOSIS — M546 Pain in thoracic spine: Secondary | ICD-10-CM | POA: Diagnosis not present

## 2021-06-11 DIAGNOSIS — R0602 Shortness of breath: Secondary | ICD-10-CM

## 2021-06-11 DIAGNOSIS — M9908 Segmental and somatic dysfunction of rib cage: Secondary | ICD-10-CM

## 2021-06-11 DIAGNOSIS — M9902 Segmental and somatic dysfunction of thoracic region: Secondary | ICD-10-CM | POA: Diagnosis not present

## 2021-06-11 LAB — COMPREHENSIVE METABOLIC PANEL
ALT: 16 U/L (ref 0–53)
AST: 17 U/L (ref 0–37)
Albumin: 4.8 g/dL (ref 3.5–5.2)
Alkaline Phosphatase: 77 U/L (ref 39–117)
BUN: 14 mg/dL (ref 6–23)
CO2: 29 mEq/L (ref 19–32)
Calcium: 9.5 mg/dL (ref 8.4–10.5)
Chloride: 102 mEq/L (ref 96–112)
Creatinine, Ser: 1.07 mg/dL (ref 0.40–1.50)
GFR: 95.34 mL/min (ref >60.00)
Glucose, Bld: 80 mg/dL (ref 70–99)
Potassium: 4 mEq/L (ref 3.5–5.1)
Sodium: 137 mEq/L (ref 135–145)
Total Bilirubin: 0.5 mg/dL (ref 0.2–1.2)
Total Protein: 7.4 g/dL (ref 6.0–8.3)

## 2021-06-11 LAB — CBC WITH DIFFERENTIAL/PLATELET
Basophils Absolute: 0 10*3/uL (ref 0.0–0.1)
Basophils Relative: 0.8 % (ref 0.0–3.0)
Eosinophils Absolute: 0.1 10*3/uL (ref 0.0–0.7)
Eosinophils Relative: 1 % (ref 0.0–5.0)
HCT: 43.6 % (ref 39.0–52.0)
Hemoglobin: 14.9 g/dL (ref 13.0–17.0)
Lymphocytes Relative: 26.9 % (ref 12.0–46.0)
Lymphs Abs: 1.4 10*3/uL (ref 0.7–4.0)
MCHC: 34.1 g/dL (ref 30.0–36.0)
MCV: 81.5 fl (ref 78.0–100.0)
Monocytes Absolute: 0.4 10*3/uL (ref 0.1–1.0)
Monocytes Relative: 7.1 % (ref 3.0–12.0)
Neutro Abs: 3.4 10*3/uL (ref 1.4–7.7)
Neutrophils Relative %: 64.2 % (ref 43.0–77.0)
Platelets: 173 10*3/uL (ref 150.0–400.0)
RBC: 5.36 Mil/uL (ref 4.22–5.81)
RDW: 12.9 % (ref 11.5–15.5)
WBC: 5.3 10*3/uL (ref 4.0–10.5)

## 2021-06-11 LAB — TSH: TSH: 5.53 u[IU]/mL — ABNORMAL HIGH (ref 0.35–5.50)

## 2021-06-11 NOTE — Progress Notes (Signed)
Phone [phone] In person visit   Subjective:   Kent Griffith is a 25 y.o. year old very pleasant male patient who presents for/with See problem oriented charting Chief Complaint  Patient presents with   Shortness of Breath    Started 1 month ago, feels like he is not getting enough air with breathing     This visit occurred during the SARS-CoV-2 public health emergency.  Safety protocols were in place, including screening questions prior to the visit, additional usage of staff PPE, and extensive cleaning of exam room while observing appropriate contact time as indicated for disinfecting solutions.   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 8.5 g 0   budesonide-formoterol (SYMBICORT) 160-4.5 MCG/ACT inhaler Inhale 2 puffs into the lungs 2 (two) times daily. 10.2 g 12   buPROPion (WELLBUTRIN XL) 300 MG 24 hr tablet Take 1 tablet (300 mg total) by mouth daily. 90 tablet 0   escitalopram (LEXAPRO) 10 MG tablet Take 1  tablet (10 mg total) by mouth daily. 90 tablet 3   linaclotide (LINZESS) 145 MCG CAPS capsule Take 1 capsule (145 mcg total) by mouth daily as needed. 30 capsule 0   lisdexamfetamine (VYVANSE) 40 MG capsule Take 1 capsule (40 mg total) by mouth every morning. 30 capsule 0   Lisdexamfetamine Dimesylate (VYVANSE) 60 MG CHEW Chew 1 tablet (60 mg) by mouth every morning. 30 tablet 0   omeprazole (PRILOSEC) 20 MG capsule Take 1 capsule (20 mg total) by mouth daily. 90 capsule 0   tirzepatide (MOUNJARO) 7.5 MG/0.5ML Pen Inject 7.5 mg into the skin once a week. 6 mL 1   No current facility-administered medications for this visit.     Objective:  BP 120/70   Pulse 77   Temp 98 F (36.7 C) (Temporal)   Ht 5\' 10"  (1.778 m)   Wt 196 lb 2 oz (89 kg)   SpO2 99%   BMI 28.14 kg/m  Gen: NAD, resting comfortably Pharynx with mild erythema and signs of drainage, nasal turbinates mildly erythematous and mildly swollen CV: RRR no murmurs rubs or gallops Lungs: CTAB no crackles, wheeze, rhonchi Abdomen: soft/nontender/nondistended/normal bowel sounds. No rebound or guarding.  Ext: no edema Skin: warm, dry  EKG: sinus rhythm with rate 75, normal axis, normal intervals, no hypertrophy, no st or t wave changes     Assessment and Plan   # Shortness of breath  S:Patient reports symptoms started at least a month ago- he actually states 3-4 weeks prior to being prescribed inhalers which were given mid October- sounds  like at least 2 months at this point.   Gets a sensation in central chest when he inhales feels like he cannot get good air- if he really pushes himself may feel like can get a good breath but most of the time cannot. Also will get some fluttering sensation- wonders if even anxiety related.   Some sinus irritation and drainage over last 3 days (covid test negative at home) - SOB is not worse with this.   Has tried an inhaler prescribed by Dr. Earlene Plater of healthy weight and wellness-albuterol  as needed.  Has also been tried on Symbicort since October.  No known history of asthma - mom with COPD in her 72s but was long term smoker.  No recent chest x-ray. Is doing symbicort consistently for last few weeks with as needed albuterol but not noting a difference.   Even right now has a vague sensation of chest tightness or muscular tightness.   No cough or fever reported. Overall feels pretty healthy with weight loss journey.   Varies heavily- sometimes associated with activity but for instance this morning was moving up to 50 lbs boxes and had no issues.  A/P: 25 year old male who has been working aggressively on weight loss with healthy weight and wellness who has had a sensation of not able to get a full deep breath/shortness of breath that is not necessarily exertional without history of asthma or COPD-we opted to update baseline blood work (CBC,CMP, TSH) as well as a chest x-ray to further evaluate.  Furthermore he has had some palpitations and chest pressure and we will get an EKG.  -if no findings to point towards cause on workup consider follow up with Dr. Jimmey Ralph vs pulmonology consult - prilosec, wellbutrin, and lexapro are only meds that predate symptoms but they far predate symptoms- not clearly a medication side effect then -if symbicort or albuterol not helpful in next week- im ok with him discontinuing - no calf tenderness or leg swelling- no family history dvt/PE- doubt PE - plans to retest for covid in next day or two but symptoms have been going on longer than acute symptoms  Recommended follow up: Return for as needed for new, worsening, persistent symptoms. Future Appointments  Date Time Provider Department Center  06/29/2021  3:40 PM Helane Rima, DO [facility] None  07/16/2021  8:00 AM Richardean Sale, DO [facility] None    Lab/Order associations:   ICD-10-CM   1. Shortness of breath  R06.02 DG Chest 2 View    CBC with Differential/Platelet    Comprehensive metabolic  panel    TSH    2. Palpitations  R00.2 EKG 12-Lead     Time Spent: 30 minutes of total time (11:17 AM- 11:47 AM) was spent on the date of the encounter performing the following actions: chart review prior to seeing the patient, obtaining history, performing a medically necessary exam, counseling on the treatment plan, placing orders, and documenting in our EHR.  - time on EKG was above and beyond the above listed time  I,Jada Bradford,acting as a scribe for Tana Conch, MD.,have documented all relevant documentation on the behalf of Tana Conch, MD,as directed by  Tana Conch, MD while in the presence of Tana Conch, MD.   I, Tana Conch, MD, have reviewed all documentation for this visit. The documentation on 06/11/21 for the exam, diagnosis, procedures, and orders are all accurate and complete.   Return precautions advised.  Tana Conch, MD

## 2021-06-11 NOTE — Patient Instructions (Addendum)
Team ekg before he leaves- already ordered  Please stop by lab before you go If you have mychart- we will send your results within 3 business days of [place] receiving them.  If you do not have mychart- we will call you about results within 5 business days of [place] receiving them.  *please also note that you will see labs on mychart as soon as they post. I will later go in and write notes on them- will say "notes from Dr. Durene Cal"  Please go to [facility]  [facility] (updated 09/05/2019) - located [address] across the street from [place] - in the basement - Hours: 8:30-5:00 PM M-F (with lunch from 12:30- 1 PM). You do NOT need an appointment.  - Please ensure you are covid symptom free before going in(No fever, chills, cough, congestion, runny nose, shortness of breath, fatigue, body aches, sore throat, headache, nausea, vomiting, diarrhea, or new loss of taste or smell. No known contacts with covid 19 or someone being tested for covid 19)- in your case with negative covid test reasonable to still go  If workup unrevealing- lets have you see Dr. Jimmey Ralph back for next steps vs. Do pulmonology consult  Recommended follow up: Return for as needed for new, worsening, persistent symptoms.

## 2021-06-11 NOTE — Patient Instructions (Addendum)
Good to see you  Follow up in 4-6 weeks 

## 2021-06-13 ENCOUNTER — Other Ambulatory Visit (INDEPENDENT_AMBULATORY_CARE_PROVIDER_SITE_OTHER): Payer: Self-pay | Admitting: Family Medicine

## 2021-06-13 ENCOUNTER — Encounter (INDEPENDENT_AMBULATORY_CARE_PROVIDER_SITE_OTHER): Payer: Self-pay | Admitting: Family Medicine

## 2021-06-13 MED ORDER — OSELTAMIVIR PHOSPHATE 75 MG PO CAPS
75.0000 mg | ORAL_CAPSULE | Freq: Two times a day (BID) | ORAL | 0 refills | Status: AC
  Filled 2021-06-13: qty 10, 5d supply, fill #0

## 2021-06-14 ENCOUNTER — Other Ambulatory Visit (HOSPITAL_COMMUNITY): Payer: Self-pay

## 2021-06-15 ENCOUNTER — Ambulatory Visit: Payer: 59 | Admitting: Family Medicine

## 2021-06-29 ENCOUNTER — Other Ambulatory Visit: Payer: Self-pay

## 2021-06-29 ENCOUNTER — Encounter (INDEPENDENT_AMBULATORY_CARE_PROVIDER_SITE_OTHER): Payer: Self-pay | Admitting: Family Medicine

## 2021-06-29 ENCOUNTER — Other Ambulatory Visit (HOSPITAL_COMMUNITY): Payer: Self-pay

## 2021-06-29 ENCOUNTER — Ambulatory Visit (INDEPENDENT_AMBULATORY_CARE_PROVIDER_SITE_OTHER): Payer: 59 | Admitting: Family Medicine

## 2021-06-29 VITALS — BP 106/59 | HR 71 | Temp 98.2°F | Ht 70.0 in | Wt 189.0 lb

## 2021-06-29 DIAGNOSIS — Z6838 Body mass index (BMI) 38.0-38.9, adult: Secondary | ICD-10-CM | POA: Diagnosis not present

## 2021-06-29 DIAGNOSIS — K219 Gastro-esophageal reflux disease without esophagitis: Secondary | ICD-10-CM

## 2021-06-29 DIAGNOSIS — F419 Anxiety disorder, unspecified: Secondary | ICD-10-CM | POA: Diagnosis not present

## 2021-06-29 DIAGNOSIS — F5081 Binge eating disorder: Secondary | ICD-10-CM

## 2021-06-30 ENCOUNTER — Other Ambulatory Visit (HOSPITAL_COMMUNITY): Payer: Self-pay

## 2021-06-30 MED ORDER — VYVANSE 60 MG PO CHEW
60.0000 mg | CHEWABLE_TABLET | Freq: Every morning | ORAL | 0 refills | Status: DC
  Filled 2021-06-30 – 2021-07-28 (×3): qty 30, 30d supply, fill #0

## 2021-06-30 MED ORDER — OMEPRAZOLE 20 MG PO CPDR
20.0000 mg | DELAYED_RELEASE_CAPSULE | Freq: Every day | ORAL | 0 refills | Status: DC
  Filled 2021-06-30 – 2021-07-28 (×2): qty 90, 90d supply, fill #0

## 2021-06-30 MED ORDER — BUPROPION HCL ER (XL) 300 MG PO TB24
300.0000 mg | ORAL_TABLET | Freq: Every day | ORAL | 0 refills | Status: DC
  Filled 2021-06-30: qty 90, 90d supply, fill #0

## 2021-06-30 NOTE — Progress Notes (Signed)
Chief Complaint:   OBESITY Kent Griffith is here to discuss his progress with his obesity treatment plan along with follow-up of his obesity related diagnoses. See Medical Weight Management Flowsheet for complete bioelectrical impedance results.  Today's visit was #: 31 Starting weight: 270 lbs Starting date: 04/27/2019 Weight change since last visit: 2 lbs Total lbs lost to date: 81 lbs Total weight loss percentage to date: -30.00%  Nutrition Plan: Practicing portion control and making smarter food choices, such as increasing vegetables and decreasing simple carbohydrates for 40% of the time. Activity: None. Anti-obesity medications: Mounjaro 7.5 mg subcutaneously weekly. Reported side effects: None.  Interim History: Kent Griffith has been under more stress recently.  He closed on the house; currently in a small apartment.  There is construction happening now that may take another month to complete.  Assessment/Plan:   1. Gastroesophageal reflux disease without esophagitis Javoni is taking omeprazole 20 mg daily for GERD.  Will refill this today.  - Refill omeprazole (PRILOSEC) 20 MG capsule; Take 1 capsule (20 mg total) by mouth daily.  Dispense: 90 capsule; Refill: 0  2. Binge eating disorder Bertha is taking Vyvanse 60 mg daily for BED.     Plan:  Continue Vyvanse to 60 mg daily.  Will send in refill today, as per below. The goals for treatment of BED are to reduce eating binges and to achieve healthy eating habits. Because binge eating can correlate with negative emotions, treatment may also address any other mental-health issues, such as depression.  People who binge eat feel as if they don't have control over how much they eat and have feelings of guilt or self-loathing after a binge eating episode.  The FDA has approved Vyvanse as a treatment option for binge eating disorder. Vyvanse targets the brain's reward center by increasing the levels of dopamine and norepinephrine, the chemicals of  the brain responsible for feelings of pleasure.  Mindful eating is the recommended nutritional approach to treating BED.   - Refill Lisdexamfetamine Dimesylate (VYVANSE) 60 MG CHEW; Chew 1 tablet (60 mg) by mouth every morning.  Dispense: 30 tablet; Refill: 0  I have consulted the [place] Controlled Substances Registry for this patient, and feel the risk/benefit ratio today is favorable for proceeding with this prescription for a controlled substance. The patient understands monitoring parameters and red flags.   3. Anxiety, with emotional eating Increased stress at this time. Medication: Lexapro 10 mg daily and Wellbutrin XL 300 mg daily.    Plan:  Continue Lexapro and Wellbutrin. Discussed cues and consequences, how thoughts affect eating, model of thoughts, feelings, and behaviors, and strategies for change by focusing on the cue. Discussed cognitive distortions, coping thoughts, and how to change your thoughts.  - Refill buPROPion (WELLBUTRIN XL) 300 MG 24 hr tablet; Take 1 tablet (300 mg total) by mouth daily.  Dispense: 90 tablet; Refill: 0  4. Obesity BMI today is 27.1  Course: Kent Griffith is currently in the action stage of change. As such, his goal is to continue with weight loss efforts.   Nutrition goals: He has agreed to practicing portion control and making smarter food choices, such as increasing vegetables and decreasing simple carbohydrates.   Exercise goals: For substantial health benefits, adults should do at least 150 minutes (2 hours and 30 minutes) a week of moderate-intensity, or 75 minutes (1 hour and 15 minutes) a week of vigorous-intensity aerobic physical activity, or an equivalent combination of moderate- and vigorous-intensity aerobic activity. Aerobic activity should be  performed in episodes of at least 10 minutes, and preferably, it should be spread throughout the week.  Behavioral modification strategies: increasing lean protein intake, decreasing simple carbohydrates,  increasing vegetables, increasing water intake, and decreasing liquid calories.  Kent Griffith has agreed to follow-up with our clinic in 4 weeks. He was informed of the importance of frequent follow-up visits to maximize his success with intensive lifestyle modifications for his multiple health conditions.   Objective:   Blood pressure (!) 106/59, pulse 71, temperature 98.2 F (36.8 C), temperature source Oral, height 5\' 10"  (1.778 m), weight 189 lb (85.7 kg), SpO2 98 %. Body mass index is 27.12 kg/m.  General: Cooperative, alert, well developed, in no acute distress. HEENT: Conjunctivae and lids unremarkable. Cardiovascular: Regular rhythm.  Lungs: Normal work of breathing. Neurologic: No focal deficits.   Lab Results  Component Value Date   CREATININE 1.07 06/11/2021   BUN 14 06/11/2021   NA 137 06/11/2021   K 4.0 06/11/2021   CL 102 06/11/2021   CO2 29 06/11/2021   Lab Results  Component Value Date   ALT 16 06/11/2021   AST 17 06/11/2021   ALKPHOS 77 06/11/2021   BILITOT 0.5 06/11/2021   Lab Results  Component Value Date   HGBA1C 4.8 05/19/2020   HGBA1C 4.9 05/07/2019   HGBA1C 5.0 07/12/2018   Lab Results  Component Value Date   INSULIN 2.5 (L) 01/27/2021   INSULIN 4.5 05/19/2020   INSULIN 12.3 05/07/2019   Lab Results  Component Value Date   TSH 5.53 (H) 06/11/2021   Lab Results  Component Value Date   CHOL 152 01/27/2021   HDL 56 01/27/2021   LDLCALC 79 01/27/2021   LDLDIRECT 99.0 07/12/2018   TRIG 94 01/27/2021   CHOLHDL 2.7 01/27/2021   Lab Results  Component Value Date   VD25OH 71.8 01/27/2021   VD25OH 82.8 05/19/2020   VD25OH 31.7 05/07/2019   Lab Results  Component Value Date   WBC 5.3 06/11/2021   HGB 14.9 06/11/2021   HCT 43.6 06/11/2021   MCV 81.5 06/11/2021   PLT 173.0 06/11/2021   Lab Results  Component Value Date   IRON 93 12/11/2019   TIBC 261 12/11/2019   FERRITIN 78 12/11/2019   Attestation Statements:   Reviewed by clinician  on day of visit: allergies, medications, problem list, medical history, surgical history, family history, social history, and previous encounter notes.  I, 02/10/2020, CMA, am acting as transcriptionist for Insurance claims handler, DO  I have reviewed the above documentation for accuracy and completeness, and I agree with the above. -  Helane Rima, DO, MS, FAAFP, DABOM - Family and Bariatric Medicine.

## 2021-07-12 ENCOUNTER — Other Ambulatory Visit (HOSPITAL_COMMUNITY): Payer: Self-pay

## 2021-07-16 ENCOUNTER — Ambulatory Visit: Payer: 59 | Admitting: Sports Medicine

## 2021-07-19 ENCOUNTER — Other Ambulatory Visit (HOSPITAL_COMMUNITY): Payer: Self-pay

## 2021-07-21 ENCOUNTER — Other Ambulatory Visit (HOSPITAL_COMMUNITY): Payer: Self-pay

## 2021-07-22 NOTE — Progress Notes (Signed)
Kent Griffith Phone: [phone]   Assessment and Plan:     1. Chronic bilateral thoracic back pain 2. Somatic dysfunction of cervical region 3. Somatic dysfunction of thoracic region 4. Somatic dysfunction of lumbar region 5. Somatic dysfunction of pelvic region 6. Somatic dysfunction of rib region -Chronic with exacerbation, subsequent visit - Recurrence of multiple musculoskeletal complaints with most prominent being in mid back at level of right lower scapula as well as neck pain - Patient has received significant relief with OMT in the past.  Elects for repeat OMT today.  Tolerated well per note below.  Of note, T7 RRSR was especially restricted and unable to articulate on exam.  Treated areas around this with successful articulation.  We will follow-up in 2 weeks for repeat OMT and can further address this area - Decision today to treat with OMT was based on Physical Exam   After verbal consent patient was treated with HVLA (high velocity low amplitude), ME (muscle energy), FPR (flex positional release), ST (soft tissue), PC/PD (Pelvic Compression/ Pelvic Decompression) techniques in cervical, rib, thoracic, lumbar, and pelvic areas. Patient tolerated the procedure well with improvement in symptoms.  Patient educated on potential side effects of soreness and recommended to rest, hydrate, and use Tylenol as needed for pain control.   Pertinent previous records reviewed include none   Follow Up: Of note, T7 RRSR was especially restricted and unable to articulate on exam.  Treated areas around this with successful articulation.  We will follow-up in 2 weeks for repeat OMT and can further address this area.  We will also discuss if tightness with inhalation has resolved after OMT treatment.  Could consider dedicated thoracic x-ray if no improvement   Subjective:   I, Kent Griffith, am serving as a Education administrator for  Doctor Glennon Mac  Chief Complaint: chronic low back pain   HPI:   06/11/21 Patient is a 26 year old male presenting with neck and back pain. Patient was last seen by Dr. Tamala Julian on 04/16/21 for this reason and had OMT. Today patient states  that lower back and shoulder blade in the middle are bugging him today    07/23/2021 Patient states that he's going good , neck has been hurting the last 3 days has been getting headaches in the afternoon, has been having mild shortness of breath feels like he cant get a deep breath pain in the shoulders and in the chest feels muscular    Relevant Historical Information: GERD     Additional pertinent review of systems negative.  Current Outpatient Medications  Medication Sig Dispense Refill   albuterol (VENTOLIN HFA) 108 (90 Base) MCG/ACT inhaler Inhale 2 puffs into the lungs every 6 (six) hours as needed for wheezing or shortness of breath. 8.5 g 0   budesonide-formoterol (SYMBICORT) 160-4.5 MCG/ACT inhaler Inhale 2 puffs into the lungs 2 (two) times daily. 10.2 g 12   buPROPion (WELLBUTRIN XL) 300 MG 24 hr tablet Take 1 tablet (300 mg total) by mouth daily. 90 tablet 0   escitalopram (LEXAPRO) 10 MG tablet Take 1 tablet (10 mg total) by mouth daily. 90 tablet 3   linaclotide (LINZESS) 145 MCG CAPS capsule Take 1 capsule (145 mcg total) by mouth daily as needed. 30 capsule 0   Lisdexamfetamine Dimesylate (VYVANSE) 60 MG CHEW Chew 1 tablet (60 mg) by mouth every morning. 30 tablet 0   omeprazole (PRILOSEC) 20 MG capsule Take  1 capsule (20 mg total) by mouth daily. 90 capsule 0   tirzepatide (MOUNJARO) 7.5 MG/0.5ML Pen Inject 7.5 mg into the skin once a week. 6 mL 1   No current facility-administered medications for this visit.      Objective:     Vitals:   07/23/21 0812  BP: 120/80  Pulse: (!) 59  SpO2: 98%  Weight: 194 lb (88 kg)  Height: 5\' 10"  (1.778 m)      Body mass index is 27.84 kg/m.    Physical Exam:     General:  Well-appearing, cooperative, sitting comfortably in no acute distress.   OMT Physical Exam:  ASIS Compression Test: Positive Right Cervical: TTP paraspinal, C6 RLSR, C3 RRSL Rib: Bilateral elevated first rib with TTP, worse on left Thoracic: TTP paraspinal, T7 RRSR Lumbar: TTP paraspinal, L1-3 RLSR, L5 rotated right Pelvis: Right anterior innominate  Electronically signed by:  Kent Griffith D.[facility] 8:37 AM 07/23/21

## 2021-07-23 ENCOUNTER — Ambulatory Visit (INDEPENDENT_AMBULATORY_CARE_PROVIDER_SITE_OTHER): Payer: 59 | Admitting: Sports Medicine

## 2021-07-23 ENCOUNTER — Other Ambulatory Visit: Payer: Self-pay

## 2021-07-23 VITALS — BP 120/80 | HR 59 | Ht 70.0 in | Wt 194.0 lb

## 2021-07-23 DIAGNOSIS — M9908 Segmental and somatic dysfunction of rib cage: Secondary | ICD-10-CM

## 2021-07-23 DIAGNOSIS — M9901 Segmental and somatic dysfunction of cervical region: Secondary | ICD-10-CM

## 2021-07-23 DIAGNOSIS — M9902 Segmental and somatic dysfunction of thoracic region: Secondary | ICD-10-CM | POA: Diagnosis not present

## 2021-07-23 DIAGNOSIS — G8929 Other chronic pain: Secondary | ICD-10-CM

## 2021-07-23 DIAGNOSIS — M9905 Segmental and somatic dysfunction of pelvic region: Secondary | ICD-10-CM | POA: Diagnosis not present

## 2021-07-23 DIAGNOSIS — M546 Pain in thoracic spine: Secondary | ICD-10-CM | POA: Diagnosis not present

## 2021-07-23 DIAGNOSIS — M9903 Segmental and somatic dysfunction of lumbar region: Secondary | ICD-10-CM

## 2021-07-23 NOTE — Patient Instructions (Addendum)
Good to see you  Thoracic HEP 2 week follow up

## 2021-07-28 ENCOUNTER — Other Ambulatory Visit (HOSPITAL_COMMUNITY): Payer: Self-pay

## 2021-08-05 NOTE — Progress Notes (Signed)
Kent Griffith D.[facility] [address] [place] [address] Phone: [phone]   Assessment and Plan:     1. Chronic bilateral thoracic back pain 2. Somatic dysfunction of cervical region 3. Somatic dysfunction of thoracic region 4. Somatic dysfunction of lumbar region 5. Somatic dysfunction of pelvic region 6. Somatic dysfunction of rib region -Chronic with exacerbation - Recurrence of multiple musculoskeletal complaints with most prominent being in neck and low back today. - Patient has received significant relief with OMT in the past.  Elects for repeat OMT today.  Tolerated well per note below. - Decision today to treat with OMT was based on Physical Exam   After verbal consent patient was treated with HVLA (high velocity low amplitude), ME (muscle energy), FPR (flex positional release), ST (soft tissue), PC/PD (Pelvic Compression/ Pelvic Decompression) techniques in cervical, rib, thoracic, lumbar, and pelvic areas. Patient tolerated the procedure well with improvement in symptoms.  Patient educated on potential side effects of soreness and recommended to rest, hydrate, and use Tylenol as needed for pain control.   Pertinent previous records reviewed include none   Follow Up: 2 weeks for reevaluation and repeat OMT.  Could consider imaging if any particular areas of discomfort or pain are not resolved   Subjective:   I, Kent Griffith, am serving as a Neurosurgeon for Doctor Richardean Sale  Chief Complaint: thoracic back pain   HPI:  06/11/21 Patient is a 26 year old male presenting with neck and back pain. Patient was last seen by Dr. Katrinka Blazing on 04/16/21 for this reason and had OMT. Today patient states  that lower back and shoulder blade in the middle are bugging him today    07/23/2021 Patient states that he's going good , neck has been hurting the last 3 days has been getting headaches in the afternoon, has been having mild shortness of breath feels  like he cant get a deep breath pain in the shoulders and in the chest feels muscular     08/06/2021 Patient states that right knee started hurting last night ,low back is hurting, and neck is super tight    Relevant Historical Information: GERD    Additional pertinent review of systems negative.  Current Outpatient Medications  Medication Sig Dispense Refill   albuterol (VENTOLIN HFA) 108 (90 Base) MCG/ACT inhaler Inhale 2 puffs into the lungs every 6 (six) hours as needed for wheezing or shortness of breath. 8.5 g 0   budesonide-formoterol (SYMBICORT) 160-4.5 MCG/ACT inhaler Inhale 2 puffs into the lungs 2 (two) times daily. 10.2 g 12   buPROPion (WELLBUTRIN XL) 300 MG 24 hr tablet Take 1 tablet (300 mg total) by mouth daily. 90 tablet 0   escitalopram (LEXAPRO) 10 MG tablet Take 1 tablet (10 mg total) by mouth daily. 90 tablet 3   linaclotide (LINZESS) 145 MCG CAPS capsule Take 1 capsule (145 mcg total) by mouth daily as needed. 30 capsule 0   Lisdexamfetamine Dimesylate (VYVANSE) 60 MG CHEW Chew 1 tablet (60 mg) by mouth every morning. 30 tablet 0   omeprazole (PRILOSEC) 20 MG capsule Take 1 capsule (20 mg total) by mouth daily. 90 capsule 0   tirzepatide (MOUNJARO) 7.5 MG/0.5ML Pen Inject 7.5 mg into the skin once a week. 6 mL 1   No current facility-administered medications for this visit.      Objective:     Vitals:   08/06/21 0754  BP: 118/62  Pulse: 80  SpO2: 95%  Weight: 194  lb (88 kg)  Height: 5\' 10"  (1.778 m)      Body mass index is 27.84 kg/m.    Physical Exam:     General: Well-appearing, cooperative, sitting comfortably in no acute distress.   OMT Physical Exam:  ASIS Compression Test: Positive Right Cervical: TTP paraspinal, C3 RRSL Rib: Bilateral elevated first rib with TTP, worse on left Thoracic: TTP paraspinal, T2-4 RLSR, T7 RRSR and unable to reduce with HVLA Lumbar: TTP paraspinal, L2 RRSR Pelvis: Right anterior innominate  Electronically signed  by:  D.[facility] 8:44 AM 08/06/21

## 2021-08-06 ENCOUNTER — Other Ambulatory Visit: Payer: Self-pay

## 2021-08-06 ENCOUNTER — Ambulatory Visit (INDEPENDENT_AMBULATORY_CARE_PROVIDER_SITE_OTHER): Payer: 59 | Admitting: Sports Medicine

## 2021-08-06 VITALS — BP 118/62 | HR 80 | Ht 70.0 in | Wt 194.0 lb

## 2021-08-06 DIAGNOSIS — M546 Pain in thoracic spine: Secondary | ICD-10-CM

## 2021-08-06 DIAGNOSIS — M9903 Segmental and somatic dysfunction of lumbar region: Secondary | ICD-10-CM | POA: Diagnosis not present

## 2021-08-06 DIAGNOSIS — M9905 Segmental and somatic dysfunction of pelvic region: Secondary | ICD-10-CM

## 2021-08-06 DIAGNOSIS — M9902 Segmental and somatic dysfunction of thoracic region: Secondary | ICD-10-CM

## 2021-08-06 DIAGNOSIS — M9901 Segmental and somatic dysfunction of cervical region: Secondary | ICD-10-CM

## 2021-08-06 DIAGNOSIS — G8929 Other chronic pain: Secondary | ICD-10-CM

## 2021-08-06 DIAGNOSIS — M9908 Segmental and somatic dysfunction of rib cage: Secondary | ICD-10-CM

## 2021-08-06 NOTE — Patient Instructions (Addendum)
Good to see you  °2 week follow up for repeat OMT °

## 2021-08-17 ENCOUNTER — Other Ambulatory Visit (HOSPITAL_COMMUNITY): Payer: Self-pay

## 2021-08-17 ENCOUNTER — Other Ambulatory Visit (INDEPENDENT_AMBULATORY_CARE_PROVIDER_SITE_OTHER): Payer: Self-pay

## 2021-08-17 DIAGNOSIS — F419 Anxiety disorder, unspecified: Secondary | ICD-10-CM

## 2021-08-17 DIAGNOSIS — R7301 Impaired fasting glucose: Secondary | ICD-10-CM

## 2021-08-17 MED ORDER — TIRZEPATIDE 7.5 MG/0.5ML ~~LOC~~ SOAJ
7.5000 mg | SUBCUTANEOUS | 1 refills | Status: DC
  Filled 2021-08-17: qty 2, 28d supply, fill #0

## 2021-08-17 MED ORDER — BUPROPION HCL ER (XL) 300 MG PO TB24
300.0000 mg | ORAL_TABLET | Freq: Every day | ORAL | 0 refills | Status: DC
  Filled 2021-08-17: qty 90, 90d supply, fill #0

## 2021-08-23 ENCOUNTER — Other Ambulatory Visit (HOSPITAL_COMMUNITY): Payer: Self-pay

## 2021-08-23 ENCOUNTER — Other Ambulatory Visit (INDEPENDENT_AMBULATORY_CARE_PROVIDER_SITE_OTHER): Payer: Self-pay

## 2021-08-23 DIAGNOSIS — F5081 Binge eating disorder: Secondary | ICD-10-CM

## 2021-08-23 MED ORDER — VYVANSE 60 MG PO CHEW
60.0000 mg | CHEWABLE_TABLET | Freq: Every morning | ORAL | 0 refills | Status: DC
  Filled 2021-08-23 – 2021-08-26 (×2): qty 30, 30d supply, fill #0

## 2021-08-24 ENCOUNTER — Other Ambulatory Visit: Payer: Self-pay

## 2021-08-24 ENCOUNTER — Ambulatory Visit (INDEPENDENT_AMBULATORY_CARE_PROVIDER_SITE_OTHER): Payer: 59 | Admitting: Sports Medicine

## 2021-08-24 VITALS — BP 110/80 | HR 70 | Ht 70.0 in | Wt 194.0 lb

## 2021-08-24 DIAGNOSIS — M9901 Segmental and somatic dysfunction of cervical region: Secondary | ICD-10-CM | POA: Diagnosis not present

## 2021-08-24 DIAGNOSIS — M9905 Segmental and somatic dysfunction of pelvic region: Secondary | ICD-10-CM

## 2021-08-24 DIAGNOSIS — M9902 Segmental and somatic dysfunction of thoracic region: Secondary | ICD-10-CM

## 2021-08-24 DIAGNOSIS — M546 Pain in thoracic spine: Secondary | ICD-10-CM | POA: Diagnosis not present

## 2021-08-24 DIAGNOSIS — M9908 Segmental and somatic dysfunction of rib cage: Secondary | ICD-10-CM | POA: Diagnosis not present

## 2021-08-24 DIAGNOSIS — G8929 Other chronic pain: Secondary | ICD-10-CM

## 2021-08-24 DIAGNOSIS — M9903 Segmental and somatic dysfunction of lumbar region: Secondary | ICD-10-CM | POA: Diagnosis not present

## 2021-08-24 NOTE — Patient Instructions (Addendum)
Good to see you  Scapula HEP 3-4 week follow up

## 2021-08-24 NOTE — Progress Notes (Signed)
Kent Griffith Kent Griffith Phone: [phone]   Assessment and Plan:     1. Chronic bilateral thoracic back pain 2. Somatic dysfunction of cervical region 3. Somatic dysfunction of thoracic region 4. Somatic dysfunction of lumbar region 5. Somatic dysfunction of pelvic region 6. Somatic dysfunction of rib region -Chronic with exacerbation, subsequent visit - Recurrence of multiple musculoskeletal complaints with most prominent being in mid back and shoulder blades - Patient has received significant relief with OMT in the past.  Elects for repeat OMT today.  Tolerated well per note below. - Of note, this was the first visit where we were able to get articulation of thoracic spine - Decision today to treat with OMT was based on Physical Exam   After verbal consent patient was treated with HVLA (high velocity low amplitude), ME (muscle energy), FPR (flex positional release), ST (soft tissue), PC/PD (Pelvic Compression/ Pelvic Decompression) techniques in cervical, rib, thoracic, lumbar, and pelvic areas. Patient tolerated the procedure well with improvement in symptoms.  Patient educated on potential side effects of soreness and recommended to rest, hydrate, and use Tylenol as needed for pain control.   Pertinent previous records reviewed include none   Follow Up: 3 to 4 weeks for repeat OMT.  Could consider imaging versus PT if no improvement or worsening of symptoms   Subjective:   I, Kent Griffith, am serving as a Education administrator for Doctor Kent Griffith  Chief Complaint: thoracic back pain   HPI:  06/11/21 Patient is a 26 year old male presenting with neck and back pain. Patient was last seen by Dr. Tamala Griffith on 04/16/21 for this reason and had OMT. Today patient states  that lower back and shoulder blade in the middle are bugging him today    07/23/2021 Patient states that he's going good , neck has been hurting the last 3  days has been getting headaches in the afternoon, has been having mild shortness of breath feels like he cant get a deep breath pain in the shoulders and in the chest feels muscular     08/06/2021 Patient states that right knee started hurting last night ,low back is hurting, and neck is super tight    08/24/2021 Patient states that he's doing okay , back feels better now that he is in his own bed, still shoulder blade issue and occasional lower back , occasional neck stiffness but nothing significant     Relevant Historical Information: GERD  Additional pertinent review of systems negative.  Current Outpatient Medications  Medication Sig Dispense Refill   albuterol (VENTOLIN HFA) 108 (90 Base) MCG/ACT inhaler Inhale 2 puffs into the lungs every 6 (six) hours as needed for wheezing or shortness of breath. 8.5 g 0   budesonide-formoterol (SYMBICORT) 160-4.5 MCG/ACT inhaler Inhale 2 puffs into the lungs 2 (two) times daily. 10.2 g 12   buPROPion (WELLBUTRIN XL) 300 MG 24 hr tablet Take 1 tablet (300 mg total) by mouth daily. 90 tablet 0   escitalopram (LEXAPRO) 10 MG tablet Take 1 tablet (10 mg total) by mouth daily. 90 tablet 3   linaclotide (LINZESS) 145 MCG CAPS capsule Take 1 capsule (145 mcg total) by mouth daily as needed. 30 capsule 0   Lisdexamfetamine Dimesylate (VYVANSE) 60 MG CHEW Chew 1 tablet by mouth every morning. 30 tablet 0   omeprazole (PRILOSEC) 20 MG capsule Take 1 capsule (20 mg total) by mouth daily. 90 capsule 0   tirzepatide (  MOUNJARO) 7.5 MG/0.5ML Pen Inject 7.5 mg (1 pen) into the skin once a week. 6 mL 1   No current facility-administered medications for this visit.      Objective:     Vitals:   08/24/21 1419  BP: 110/80  Pulse: 70  SpO2: 90%  Weight: 194 lb (88 kg)  Height: 5\' 10"  (1.778 m)      Body mass index is 27.84 kg/m.    Physical Exam:     General: Well-appearing, cooperative, sitting comfortably in no acute distress.   OMT Physical  Exam:  ASIS Compression Test: Positive Right Cervical: TTP paraspinal, C2-4 RR SR Rib: Bilateral elevated first rib with TTP on left  Thoracic: TTP paraspinal,T4-7 RRSL Lumbar: TTP paraspinal, L2 RR SR Pelvis: Right anterior innominate  Electronically signed by:  Kent Griffith D.[facility] 2:46 PM 08/24/21

## 2021-08-25 ENCOUNTER — Other Ambulatory Visit (HOSPITAL_COMMUNITY): Payer: Self-pay

## 2021-08-25 ENCOUNTER — Other Ambulatory Visit (INDEPENDENT_AMBULATORY_CARE_PROVIDER_SITE_OTHER): Payer: Self-pay | Admitting: Family Medicine

## 2021-08-25 MED ORDER — TIRZEPATIDE 10 MG/0.5ML ~~LOC~~ SOAJ
10.0000 mg | SUBCUTANEOUS | 1 refills | Status: DC
  Filled 2021-08-25: qty 2, 28d supply, fill #0
  Filled 2021-09-16: qty 6, 84d supply, fill #1

## 2021-08-26 ENCOUNTER — Other Ambulatory Visit (HOSPITAL_COMMUNITY): Payer: Self-pay

## 2021-08-27 ENCOUNTER — Ambulatory Visit: Payer: 59 | Admitting: Sports Medicine

## 2021-09-01 ENCOUNTER — Other Ambulatory Visit (INDEPENDENT_AMBULATORY_CARE_PROVIDER_SITE_OTHER): Payer: Self-pay | Admitting: Family Medicine

## 2021-09-01 DIAGNOSIS — R5383 Other fatigue: Secondary | ICD-10-CM

## 2021-09-01 DIAGNOSIS — R7301 Impaired fasting glucose: Secondary | ICD-10-CM

## 2021-09-01 DIAGNOSIS — E559 Vitamin D deficiency, unspecified: Secondary | ICD-10-CM

## 2021-09-01 DIAGNOSIS — R5381 Other malaise: Secondary | ICD-10-CM

## 2021-09-02 LAB — CBC WITH DIFFERENTIAL/PLATELET
Basophils Absolute: 0.1 10*3/uL (ref 0.0–0.2)
Basos: 1 %
EOS (ABSOLUTE): 0.1 10*3/uL (ref 0.0–0.4)
Eos: 1 %
Hematocrit: 46.1 % (ref 37.5–51.0)
Hemoglobin: 15.1 g/dL (ref 13.0–17.7)
Immature Grans (Abs): 0 10*3/uL (ref 0.0–0.1)
Immature Granulocytes: 0 %
Lymphocytes Absolute: 1.2 10*3/uL (ref 0.7–3.1)
Lymphs: 24 %
MCH: 27.5 pg (ref 26.6–33.0)
MCHC: 32.8 g/dL (ref 31.5–35.7)
MCV: 84 fL (ref 79–97)
Monocytes Absolute: 0.3 10*3/uL (ref 0.1–0.9)
Monocytes: 6 %
Neutrophils Absolute: 3.5 10*3/uL (ref 1.4–7.0)
Neutrophils: 68 %
Platelets: 201 10*3/uL (ref 150–450)
RBC: 5.5 x10E6/uL (ref 4.14–5.80)
RDW: 13.1 % (ref 11.6–15.4)
WBC: 5.1 10*3/uL (ref 3.4–10.8)

## 2021-09-02 LAB — COMPREHENSIVE METABOLIC PANEL
ALT: 18 IU/L (ref 0–44)
AST: 18 IU/L (ref 0–40)
Albumin/Globulin Ratio: 2 (ref 1.2–2.2)
Albumin: 4.9 g/dL (ref 4.1–5.2)
Alkaline Phosphatase: 99 IU/L (ref 44–121)
BUN/Creatinine Ratio: 13 (ref 9–20)
BUN: 12 mg/dL (ref 6–20)
Bilirubin Total: 0.4 mg/dL (ref 0.0–1.2)
CO2: 25 mmol/L (ref 20–29)
Calcium: 9.5 mg/dL (ref 8.7–10.2)
Chloride: 101 mmol/L (ref 96–106)
Creatinine, Ser: 0.9 mg/dL (ref 0.76–1.27)
Globulin, Total: 2.5 g/dL (ref 1.5–4.5)
Glucose: 76 mg/dL (ref 70–99)
Potassium: 4.1 mmol/L (ref 3.5–5.2)
Sodium: 142 mmol/L (ref 134–144)
Total Protein: 7.4 g/dL (ref 6.0–8.5)
eGFR: 120 mL/min/{1.73_m2} (ref >59)

## 2021-09-02 LAB — TSH: TSH: 4.62 u[IU]/mL — ABNORMAL HIGH (ref 0.450–4.500)

## 2021-09-02 LAB — T4, FREE: Free T4: 1.4 ng/dL (ref 0.82–1.77)

## 2021-09-02 LAB — VITAMIN D 25 HYDROXY (VIT D DEFICIENCY, FRACTURES): Vit D, 25-Hydroxy: 40.7 ng/mL (ref 30.0–100.0)

## 2021-09-02 LAB — HEMOGLOBIN A1C
Est. average glucose Bld gHb Est-mCnc: 91 mg/dL
Hgb A1c MFr Bld: 4.8 % (ref 4.8–5.6)

## 2021-09-02 LAB — VITAMIN B12: Vitamin B-12: 342 pg/mL (ref 232–1245)

## 2021-09-16 ENCOUNTER — Other Ambulatory Visit (HOSPITAL_COMMUNITY): Payer: Self-pay

## 2021-09-16 NOTE — Progress Notes (Signed)
? Benito Mccreedy D.Merril Abbe ?[facility] ?[place] ?Phone: [phone] ?  ?Assessment and Plan:   ?  ?1. Chronic bilateral thoracic back pain ?2. Neck pain ?3. Somatic dysfunction of cervical region ?4. Somatic dysfunction of thoracic region ?5. Somatic dysfunction of lumbar region ?6. Somatic dysfunction of pelvic region ?7. Somatic dysfunction of rib region ?8. Somatic dysfunction of sacral region ?-Chronic with exacerbation, subsequent visit ?- Recurrence of multiple musculoskeletal complaints with most prominent being in thoracic region and neck ?- Patient has received significant relief with OMT in the past.  Elects for repeat OMT today.  Tolerated well per note below. ?- Decision today to treat with OMT was based on Physical Exam ?  ?After verbal consent patient was treated with HVLA (high velocity low amplitude), ME (muscle energy), FPR (flex positional release), ST (soft tissue), PC/PD (Pelvic Compression/ Pelvic Decompression) techniques in cervical, rib, thoracic, lumbar, and pelvic areas. Patient tolerated the procedure well with improvement in symptoms.  Patient educated on potential side effects of soreness and recommended to rest, hydrate, and use Tylenol as needed for pain control. ?  ?Pertinent previous records reviewed include none ?  ?Follow Up: 4 weeks for repeat OMT ?  ?Subjective:   ?I, Pincus Badder, am serving as a Education administrator for Doctor [employer] ? ?Chief Complaint: thoracic back pain  ? ?HPI:  ?06/11/21 ?Patient is a 26 year old male presenting with neck and back pain. Patient was last seen by Dr. Tamala Julian on 04/16/21 for this reason and had OMT. Today patient states  ?that lower back and shoulder blade in the middle are bugging him today  ?  ?07/23/2021 ?Patient states that he's going good , neck has been hurting the last 3 days has been getting headaches in the afternoon, has been having mild shortness of breath feels like he cant get a deep breath  pain in the shoulders and in the chest feels muscular  ?  ? 08/06/2021 ?Patient states that right knee started hurting last night ,low back is hurting, and neck is super tight  ?  ?08/24/2021 ?Patient states that he's doing okay , back feels better now that he is in his own bed, still shoulder blade issue and occasional lower back , occasional neck stiffness but nothing significant  ?  ?09/17/2021 ?Patient states that he overall well, still has tightness in between the shoulder blades and neck  ? ? ?  ?Relevant Historical Information: GERD ? ?Additional pertinent review of systems negative. ? ?Current Outpatient Medications  ?Medication Sig Dispense Refill  ? albuterol (VENTOLIN HFA) 108 (90 Base) MCG/ACT inhaler Inhale 2 puffs into the lungs every 6 (six) hours as needed for wheezing or shortness of breath. 8.5 g 0  ? budesonide-formoterol (SYMBICORT) 160-4.5 MCG/ACT inhaler Inhale 2 puffs into the lungs 2 (two) times daily. 10.2 g 12  ? buPROPion (WELLBUTRIN XL) 300 MG 24 hr tablet Take 1 tablet (300 mg total) by mouth daily. 90 tablet 0  ? escitalopram (LEXAPRO) 10 MG tablet Take 1 tablet (10 mg total) by mouth daily. 90 tablet 3  ? linaclotide (LINZESS) 145 MCG CAPS capsule Take 1 capsule (145 mcg total) by mouth daily as needed. 30 capsule 0  ? Lisdexamfetamine Dimesylate (VYVANSE) 60 MG CHEW Chew 1 tablet by mouth every morning. 30 tablet 0  ? omeprazole (PRILOSEC) 20 MG capsule Take 1 capsule (20 mg total) by mouth daily. 90 capsule 0  ? tirzepatide (MOUNJARO) 10 MG/0.5ML Pen  Inject 10 mg into the skin once a week. 6 mL 1  ? tirzepatide (MOUNJARO) 7.5 MG/0.5ML Pen Inject 7.5 mg (1 pen) into the skin once a week. 6 mL 1  ? ?No current facility-administered medications for this visit.  ?  ?  ?Objective:   ?  ?Vitals:  ? 09/17/21 1120  ?BP: 110/70  ?Pulse: 72  ?SpO2: 99%  ?Weight: 194 lb (88 kg)  ?Height: 5\' 10"  (1.778 m)  ?  ?  ?Body mass index is 27.84 kg/m?.  ?  ?Physical Exam:   ?  ?General: Well-appearing,  cooperative, sitting comfortably in no acute distress.  ? ?OMT Physical Exam: ? ?ASIS Compression Test: Positive Right ?Cervical: TTP paraspinal, C3 RRSL, C5 RLSR ?Rib: Bilateral elevated first rib with TTP on left ?Thoracic: TTP paraspinal and bilateral trapezius.  T4 RRSL ?Lumbar: TTP paraspinal, L5 rotated right ?Pelvis: Right anterior innominate with out flare ?Sacrum: Forward rotation with prominent coccyx.  NTTP sacral base bilaterally ? ?Electronically signed by:  ?Benito Mccreedy D.Merril Abbe ?[facility] ?12:15 PM 09/17/21 ?

## 2021-09-17 ENCOUNTER — Ambulatory Visit (INDEPENDENT_AMBULATORY_CARE_PROVIDER_SITE_OTHER): Payer: 59 | Admitting: Sports Medicine

## 2021-09-17 ENCOUNTER — Other Ambulatory Visit: Payer: Self-pay

## 2021-09-17 VITALS — BP 110/70 | HR 72 | Ht 70.0 in | Wt 194.0 lb

## 2021-09-17 DIAGNOSIS — M546 Pain in thoracic spine: Secondary | ICD-10-CM | POA: Diagnosis not present

## 2021-09-17 DIAGNOSIS — G8929 Other chronic pain: Secondary | ICD-10-CM

## 2021-09-17 DIAGNOSIS — M9905 Segmental and somatic dysfunction of pelvic region: Secondary | ICD-10-CM | POA: Diagnosis not present

## 2021-09-17 DIAGNOSIS — M542 Cervicalgia: Secondary | ICD-10-CM | POA: Diagnosis not present

## 2021-09-17 DIAGNOSIS — M9903 Segmental and somatic dysfunction of lumbar region: Secondary | ICD-10-CM | POA: Diagnosis not present

## 2021-09-17 DIAGNOSIS — M9904 Segmental and somatic dysfunction of sacral region: Secondary | ICD-10-CM | POA: Diagnosis not present

## 2021-09-17 DIAGNOSIS — M9901 Segmental and somatic dysfunction of cervical region: Secondary | ICD-10-CM | POA: Diagnosis not present

## 2021-09-17 DIAGNOSIS — M9908 Segmental and somatic dysfunction of rib cage: Secondary | ICD-10-CM | POA: Diagnosis not present

## 2021-09-17 DIAGNOSIS — M9902 Segmental and somatic dysfunction of thoracic region: Secondary | ICD-10-CM

## 2021-09-17 NOTE — Patient Instructions (Addendum)
Good to see you  4 week follow up   

## 2021-09-27 ENCOUNTER — Other Ambulatory Visit (HOSPITAL_COMMUNITY): Payer: Self-pay

## 2021-09-27 ENCOUNTER — Other Ambulatory Visit (INDEPENDENT_AMBULATORY_CARE_PROVIDER_SITE_OTHER): Payer: Self-pay

## 2021-09-27 DIAGNOSIS — F5081 Binge eating disorder: Secondary | ICD-10-CM

## 2021-09-27 MED ORDER — VYVANSE 60 MG PO CHEW
60.0000 mg | CHEWABLE_TABLET | Freq: Every morning | ORAL | 0 refills | Status: DC
  Filled 2021-09-27: qty 90, 90d supply, fill #0

## 2021-09-27 NOTE — Telephone Encounter (Signed)
Pt contacted office requesting refill on Vyvanse and would like a 90 day supply sent to pharmacy.  ?

## 2021-09-28 ENCOUNTER — Other Ambulatory Visit (HOSPITAL_COMMUNITY): Payer: Self-pay

## 2021-10-05 ENCOUNTER — Telehealth (INDEPENDENT_AMBULATORY_CARE_PROVIDER_SITE_OTHER): Payer: Self-pay

## 2021-10-05 DIAGNOSIS — K219 Gastro-esophageal reflux disease without esophagitis: Secondary | ICD-10-CM

## 2021-10-05 DIAGNOSIS — F419 Anxiety disorder, unspecified: Secondary | ICD-10-CM

## 2021-10-05 DIAGNOSIS — K5903 Drug induced constipation: Secondary | ICD-10-CM

## 2021-10-05 NOTE — Telephone Encounter (Signed)
Contacted office and is complaining of increased polyphagia. ? ?Please advise ?

## 2021-10-06 ENCOUNTER — Other Ambulatory Visit (HOSPITAL_COMMUNITY): Payer: Self-pay

## 2021-10-06 MED ORDER — LINACLOTIDE 145 MCG PO CAPS
145.0000 ug | ORAL_CAPSULE | Freq: Every day | ORAL | 11 refills | Status: DC | PRN
  Filled 2021-10-06: qty 30, 30d supply, fill #0

## 2021-10-06 MED ORDER — ESCITALOPRAM OXALATE 10 MG PO TABS
10.0000 mg | ORAL_TABLET | Freq: Every day | ORAL | 3 refills | Status: DC
  Filled 2021-10-06: qty 90, 90d supply, fill #0
  Filled 2022-01-12: qty 90, 90d supply, fill #1
  Filled 2022-04-18: qty 90, 90d supply, fill #2
  Filled 2022-07-21 – 2022-08-26 (×2): qty 90, 90d supply, fill #3

## 2021-10-06 MED ORDER — MOUNJARO 12.5 MG/0.5ML ~~LOC~~ SOAJ
12.5000 mg | SUBCUTANEOUS | 2 refills | Status: DC
  Filled 2021-10-06: qty 6, 84d supply, fill #0

## 2021-10-06 MED ORDER — BUPROPION HCL ER (XL) 300 MG PO TB24
300.0000 mg | ORAL_TABLET | Freq: Every day | ORAL | 3 refills | Status: DC
  Filled 2021-10-06 – 2021-11-29 (×2): qty 90, 90d supply, fill #0
  Filled 2022-02-25 – 2022-04-18 (×2): qty 90, 90d supply, fill #1
  Filled 2022-07-21 – 2022-08-26 (×2): qty 90, 90d supply, fill #2

## 2021-10-06 MED ORDER — OMEPRAZOLE 20 MG PO CPDR
20.0000 mg | DELAYED_RELEASE_CAPSULE | Freq: Every day | ORAL | 3 refills | Status: DC
  Filled 2021-10-06 – 2021-11-22 (×2): qty 90, 90d supply, fill #0
  Filled 2022-02-21: qty 90, 90d supply, fill #1
  Filled 2022-05-23 – 2022-06-07 (×2): qty 90, 90d supply, fill #2
  Filled 2022-09-07: qty 90, 90d supply, fill #3

## 2021-10-06 NOTE — Addendum Note (Signed)
Addended by: Helane Rima R on: 10/06/2021 01:48 PM   Modules accepted: Orders

## 2021-10-13 NOTE — Progress Notes (Signed)
? Kent Griffith D.Judd Gaudier ?[facility] ?[address] [place] [address] ?Phone: [phone] ?  ?Assessment and Plan:   ?  ?1. Chronic bilateral thoracic back pain ?2. Neck pain ?3. Somatic dysfunction of cervical region ?4. Somatic dysfunction of thoracic region ?5. Somatic dysfunction of lumbar region ?6. Somatic dysfunction of pelvic region ?7. Somatic dysfunction of rib region ?-Chronic with exacerbation, subsequent sports medicine visit ?- Recurrence of multiple musculoskeletal complaints with most prominent being in neck and upper thoracic region today.  Overall patient has had improvement with OMT especially in the middle to lower back pain ?- Patient has received significant relief with OMT in the past.  Elects for repeat OMT today.  Tolerated well per note below. ?- Decision today to treat with OMT was based on Physical Exam ?  ?After verbal consent patient was treated with HVLA (high velocity low amplitude), ME (muscle energy), FPR (flex positional release), ST (soft tissue), PC/PD (Pelvic Compression/ Pelvic Decompression) techniques in cervical, rib, thoracic, lumbar, and pelvic areas. Patient tolerated the procedure well with improvement in symptoms.  Patient educated on potential side effects of soreness and recommended to rest, hydrate, and use Tylenol as needed for pain control. ?  ?Pertinent previous records reviewed include none ?  ?Follow Up: Patient is transitioning jobs and will be transitioning insurances.  He will schedule a follow-up after his insurance effective date ?  ?Subjective:   ?I, Kent Griffith, am serving as a Neurosurgeon for Doctor [employer] ? ?Chief Complaint: OMT follow up  ? ?HPI:  ?06/11/21 ?Patient is a 26 year old male presenting with neck and back pain. Patient was last seen by Dr. Katrinka Blazing on 04/16/21 for this reason and had OMT. Today patient states  ?that lower back and shoulder blade in the middle are bugging him today  ?  ?07/23/2021 ?Patient states  that he's going good , neck has been hurting the last 3 days has been getting headaches in the afternoon, has been having mild shortness of breath feels like he cant get a deep breath pain in the shoulders and in the chest feels muscular  ?  ? 08/06/2021 ?Patient states that right knee started hurting last night ,low back is hurting, and neck is super tight  ?  ?08/24/2021 ?Patient states that he's doing okay , back feels better now that he is in his own bed, still shoulder blade issue and occasional lower back , occasional neck stiffness but nothing significant  ?  ?09/17/2021 ?Patient states that he overall well, still has tightness in between the shoulder blades and neck  ?  ?10/14/2021 ?Patient states that he is doing good back and shoulder are good neck has been tight and wants that middle back cracked  ?  ?Relevant Historical Information: GERD ? ?Additional pertinent review of systems negative. ? ?Current Outpatient Medications  ?Medication Sig Dispense Refill  ? buPROPion (WELLBUTRIN XL) 300 MG 24 hr tablet Take 1 tablet by mouth daily. 90 tablet 3  ? escitalopram (LEXAPRO) 10 MG tablet Take 1 tablet by mouth daily. 90 tablet 3  ? linaclotide (LINZESS) 145 MCG CAPS capsule Take 1 capsule by mouth daily as needed. 30 capsule 11  ? Lisdexamfetamine Dimesylate (VYVANSE) 60 MG CHEW Chew 1 tablet by mouth every morning. 90 tablet 0  ? omeprazole (PRILOSEC) 20 MG capsule Take 1 capsule by mouth daily. 90 capsule 3  ? tirzepatide (MOUNJARO) 12.5 MG/0.5ML Pen Inject 12.5 mg into the skin once a week. 6  mL 2  ? albuterol (VENTOLIN HFA) 108 (90 Base) MCG/ACT inhaler Inhale 2 puffs into the lungs every 6 (six) hours as needed for wheezing or shortness of breath. 8.5 g 0  ? budesonide-formoterol (SYMBICORT) 160-4.5 MCG/ACT inhaler Inhale 2 puffs into the lungs 2 (two) times daily. 10.2 g 12  ? tirzepatide (MOUNJARO) 10 MG/0.5ML Pen Inject 10 mg into the skin once a week. (Patient not taking: Reported on 10/14/2021) 6 mL 1  ?  tirzepatide (MOUNJARO) 7.5 MG/0.5ML Pen Inject 7.5 mg (1 pen) into the skin once a week. 6 mL 1  ? ?No current facility-administered medications for this visit.  ?  ?  ?Objective:   ?  ?Vitals:  ? 10/14/21 1555  ?BP: 110/80  ?Pulse: 77  ?SpO2: 99%  ?Weight: 193 lb (87.5 kg)  ?Height: 5\' 10"  (1.778 m)  ?  ?  ?Body mass index is 27.69 kg/m?.  ?  ?Physical Exam:   ?  ?General: Well-appearing, cooperative, sitting comfortably in no acute distress.  ? ?OMT Physical Exam: ? ?ASIS Compression Test: Positive Right ?Cervical: TTP paraspinal, C3 RRSL ?Rib: Bilateral elevated first rib with TTP, worse on left ?Thoracic: TTP paraspinal, T3-5 RRSL ?Lumbar: TTP paraspinal, L2 RLSL ?Pelvis: Right anterior innominate ? ?Electronically signed by:  ? D.Kent Griffith ? Sports Medicine ?4:17 PM 10/14/21 ?

## 2021-10-14 ENCOUNTER — Ambulatory Visit (INDEPENDENT_AMBULATORY_CARE_PROVIDER_SITE_OTHER): Payer: 59 | Admitting: Sports Medicine

## 2021-10-14 VITALS — BP 110/80 | HR 77 | Ht 70.0 in | Wt 193.0 lb

## 2021-10-14 DIAGNOSIS — M9901 Segmental and somatic dysfunction of cervical region: Secondary | ICD-10-CM | POA: Diagnosis not present

## 2021-10-14 DIAGNOSIS — M9902 Segmental and somatic dysfunction of thoracic region: Secondary | ICD-10-CM | POA: Diagnosis not present

## 2021-10-14 DIAGNOSIS — M542 Cervicalgia: Secondary | ICD-10-CM

## 2021-10-14 DIAGNOSIS — G8929 Other chronic pain: Secondary | ICD-10-CM | POA: Diagnosis not present

## 2021-10-14 DIAGNOSIS — M9908 Segmental and somatic dysfunction of rib cage: Secondary | ICD-10-CM

## 2021-10-14 DIAGNOSIS — M9903 Segmental and somatic dysfunction of lumbar region: Secondary | ICD-10-CM | POA: Diagnosis not present

## 2021-10-14 DIAGNOSIS — M546 Pain in thoracic spine: Secondary | ICD-10-CM | POA: Diagnosis not present

## 2021-10-14 DIAGNOSIS — M9905 Segmental and somatic dysfunction of pelvic region: Secondary | ICD-10-CM

## 2021-10-14 NOTE — Patient Instructions (Addendum)
Good to see you  ?As needed follow up  ?

## 2021-10-16 ENCOUNTER — Other Ambulatory Visit (HOSPITAL_COMMUNITY): Payer: Self-pay

## 2021-11-22 ENCOUNTER — Other Ambulatory Visit (HOSPITAL_COMMUNITY): Payer: Self-pay

## 2021-11-29 ENCOUNTER — Other Ambulatory Visit (HOSPITAL_COMMUNITY): Payer: Self-pay

## 2022-01-01 ENCOUNTER — Other Ambulatory Visit (HOSPITAL_COMMUNITY): Payer: Self-pay

## 2022-01-03 ENCOUNTER — Other Ambulatory Visit (HOSPITAL_COMMUNITY): Payer: Self-pay

## 2022-01-03 MED ORDER — VYVANSE 60 MG PO CHEW
CHEWABLE_TABLET | ORAL | 0 refills | Status: DC
  Filled 2022-01-03: qty 30, 30d supply, fill #0

## 2022-01-04 ENCOUNTER — Other Ambulatory Visit (HOSPITAL_COMMUNITY): Payer: Self-pay

## 2022-01-12 ENCOUNTER — Other Ambulatory Visit (HOSPITAL_COMMUNITY): Payer: Self-pay

## 2022-01-13 ENCOUNTER — Other Ambulatory Visit (HOSPITAL_COMMUNITY): Payer: Self-pay

## 2022-01-13 MED ORDER — CYANOCOBALAMIN 1000 MCG/ML IJ SOLN
INTRAMUSCULAR | 0 refills | Status: DC
  Filled 2022-01-13 – 2022-01-29 (×2): qty 2, 14d supply, fill #0

## 2022-01-13 MED ORDER — PROPRANOLOL HCL 20 MG PO TABS
ORAL_TABLET | ORAL | 0 refills | Status: DC
  Filled 2022-01-13: qty 30, 10d supply, fill #0

## 2022-01-25 ENCOUNTER — Other Ambulatory Visit (HOSPITAL_COMMUNITY): Payer: Self-pay

## 2022-01-29 ENCOUNTER — Other Ambulatory Visit (HOSPITAL_COMMUNITY): Payer: Self-pay

## 2022-02-02 ENCOUNTER — Other Ambulatory Visit (HOSPITAL_COMMUNITY): Payer: Self-pay

## 2022-02-02 MED ORDER — VYVANSE 60 MG PO CHEW
CHEWABLE_TABLET | ORAL | 0 refills | Status: DC
  Filled 2022-02-02: qty 30, 30d supply, fill #0

## 2022-02-11 ENCOUNTER — Encounter: Payer: 59 | Admitting: Family Medicine

## 2022-02-16 ENCOUNTER — Encounter (INDEPENDENT_AMBULATORY_CARE_PROVIDER_SITE_OTHER): Payer: Self-pay

## 2022-02-22 ENCOUNTER — Other Ambulatory Visit (HOSPITAL_COMMUNITY): Payer: Self-pay

## 2022-02-25 ENCOUNTER — Other Ambulatory Visit (HOSPITAL_COMMUNITY): Payer: Self-pay

## 2022-02-25 ENCOUNTER — Other Ambulatory Visit (INDEPENDENT_AMBULATORY_CARE_PROVIDER_SITE_OTHER): Payer: Self-pay

## 2022-02-25 ENCOUNTER — Ambulatory Visit: Payer: No Typology Code available for payment source | Admitting: Sports Medicine

## 2022-02-25 VITALS — Ht 70.0 in | Wt 190.0 lb

## 2022-02-25 DIAGNOSIS — M9903 Segmental and somatic dysfunction of lumbar region: Secondary | ICD-10-CM

## 2022-02-25 DIAGNOSIS — M545 Low back pain, unspecified: Secondary | ICD-10-CM

## 2022-02-25 DIAGNOSIS — M542 Cervicalgia: Secondary | ICD-10-CM | POA: Diagnosis not present

## 2022-02-25 DIAGNOSIS — G8929 Other chronic pain: Secondary | ICD-10-CM

## 2022-02-25 DIAGNOSIS — M9901 Segmental and somatic dysfunction of cervical region: Secondary | ICD-10-CM

## 2022-02-25 DIAGNOSIS — M9902 Segmental and somatic dysfunction of thoracic region: Secondary | ICD-10-CM | POA: Diagnosis not present

## 2022-02-25 DIAGNOSIS — M9905 Segmental and somatic dysfunction of pelvic region: Secondary | ICD-10-CM

## 2022-02-25 DIAGNOSIS — M9908 Segmental and somatic dysfunction of rib cage: Secondary | ICD-10-CM

## 2022-02-25 MED ORDER — PROPRANOLOL HCL 20 MG PO TABS
20.0000 mg | ORAL_TABLET | Freq: Three times a day (TID) | ORAL | 0 refills | Status: DC | PRN
  Filled 2022-02-25 – 2022-03-08 (×2): qty 30, 10d supply, fill #0

## 2022-02-25 NOTE — Patient Instructions (Addendum)
Good to see you  3-4 follow up for repeat MSK

## 2022-02-25 NOTE — Progress Notes (Signed)
Kent Griffith D.[facility] [address] [place] [address] Phone: [phone]   Assessment and Plan:     1. Neck pain 2. Chronic bilateral low back pain without sciatica 3. Somatic dysfunction of cervical region 4. Somatic dysfunction of thoracic region 5. Somatic dysfunction of lumbar region 6. Somatic dysfunction of pelvic region 7. Somatic dysfunction of rib region  -Chronic with exacerbation, subsequent sports medicine visit - Recurrence of multiple musculoskeletal complaints with most prominent being in bilateral trapezius and neck with patient changing jobs leading to increased stress - Continue HEP.  New handouts for trapezius, neck, hamstring provided today - Patient has received significant relief with OMT in the past.  Elects for repeat OMT today.  Tolerated well per note below. - Decision today to treat with OMT was based on Physical Exam   After verbal consent patient was treated with HVLA (high velocity low amplitude), ME (muscle energy), FPR (flex positional release), ST (soft tissue), PC/PD (Pelvic Compression/ Pelvic Decompression) techniques in cervical, rib, thoracic, lumbar, and pelvic areas. Patient tolerated the procedure well with improvement in symptoms.  Patient educated on potential side effects of soreness and recommended to rest, hydrate, and use Tylenol as needed for pain control.   Pertinent previous records reviewed include none   Follow Up: 4 weeks for reevaluation and repeat OMT if needed   Subjective:   I, Kent Griffith, am serving as a Neurosurgeon for Doctor Richardean Sale  Chief Complaint: OMT follow up    HPI:  06/11/21 Patient is a 26 year old male presenting with neck and back pain. Patient was last seen by Dr. Katrinka Blazing on 04/16/21 for this reason and had OMT. Today patient states  that lower back and shoulder blade in the middle are bugging him today    07/23/2021 Patient states that he's going good , neck has been  hurting the last 3 days has been getting headaches in the afternoon, has been having mild shortness of breath feels like he cant get a deep breath pain in the shoulders and in the chest feels muscular     08/06/2021 Patient states that right knee started hurting last night ,low back is hurting, and neck is super tight    08/24/2021 Patient states that he's doing okay , back feels better now that he is in his own bed, still shoulder blade issue and occasional lower back , occasional neck stiffness but nothing significant    09/17/2021 Patient states that he overall well, still has tightness in between the shoulder blades and neck    10/14/2021 Patient states that he is doing good back and shoulder are good neck has been tight and wants that middle back cracked   02/25/2022 Patient states neck is very stiff when he pops it no comfortable  and lowe back , would like some resources for general flexibility   Relevant Historical Information: GERD  Additional pertinent review of systems negative.  Current Outpatient Medications  Medication Sig Dispense Refill   albuterol (VENTOLIN HFA) 108 (90 Base) MCG/ACT inhaler Inhale 2 puffs into the lungs every 6 (six) hours as needed for wheezing or shortness of breath. 8.5 g 0   budesonide-formoterol (SYMBICORT) 160-4.5 MCG/ACT inhaler Inhale 2 puffs into the lungs 2 (two) times daily. 10.2 g 12   buPROPion (WELLBUTRIN XL) 300 MG 24 hr tablet Take 1 tablet by mouth daily. 90 tablet 3   cyanocobalamin (,VITAMIN B-12,) 1000 MCG/ML injection Inject 37ml into the skin once  a week. 2 mL 0   escitalopram (LEXAPRO) 10 MG tablet Take 1 tablet by mouth daily. 90 tablet 3   linaclotide (LINZESS) 145 MCG CAPS capsule Take 1 capsule by mouth daily as needed. 30 capsule 11   Lisdexamfetamine Dimesylate (VYVANSE) 60 MG CHEW Chew 1 tablet by mouth every morning. 90 tablet 0   Lisdexamfetamine Dimesylate (VYVANSE) 60 MG CHEW Chew 1/2 tablet by mouth twice daily in the  morning and at lunch. 30 tablet 0   omeprazole (PRILOSEC) 20 MG capsule Take 1 capsule by mouth daily. 90 capsule 3   propranolol (INDERAL) 20 MG tablet Take 1 tablet by mouth 3 times a day as needed 30 tablet 0   tirzepatide (MOUNJARO) 10 MG/0.5ML Pen Inject 10 mg into the skin once a week. (Patient not taking: Reported on 10/14/2021) 6 mL 1   tirzepatide (MOUNJARO) 12.5 MG/0.5ML Pen Inject 12.5 mg into the skin once a week. 6 mL 2   tirzepatide (MOUNJARO) 7.5 MG/0.5ML Pen Inject 7.5 mg (1 pen) into the skin once a week. 6 mL 1   No current facility-administered medications for this visit.      Objective:     Vitals:   02/25/22 0756  Weight: 190 lb (86.2 kg)  Height: 5\' 10"  (1.778 m)      Body mass index is 27.26 kg/m.    Physical Exam:     General: Well-appearing, cooperative, sitting comfortably in no acute distress.   OMT Physical Exam:  ASIS Compression Test: Positive Right Cervical: Mild TTP paraspinal, no significant rotation Rib: Bilateral elevated first rib with TTP Thoracic: TTP paraspinal, T6 RRSR Lumbar: TTP paraspinal, L1-3 RRSL Pelvis: Right anterior innominate  Electronically signed by:  D.Kent [facility] 8:33 AM 02/25/22

## 2022-02-28 ENCOUNTER — Other Ambulatory Visit (HOSPITAL_COMMUNITY): Payer: Self-pay

## 2022-02-28 MED ORDER — VYVANSE 60 MG PO CHEW
CHEWABLE_TABLET | ORAL | 0 refills | Status: DC
  Filled 2022-02-28 – 2022-04-18 (×2): qty 30, 30d supply, fill #0

## 2022-02-28 MED ORDER — VYVANSE 60 MG PO CHEW
CHEWABLE_TABLET | ORAL | 0 refills | Status: DC
  Filled 2022-02-28: qty 30, 30d supply, fill #0
  Filled 2022-03-02: qty 30, fill #0
  Filled 2022-03-08: qty 30, 30d supply, fill #0

## 2022-02-28 MED ORDER — VYVANSE 60 MG PO CHEW
CHEWABLE_TABLET | ORAL | 0 refills | Status: DC
  Filled 2022-06-07: qty 30, 30d supply, fill #0

## 2022-03-02 ENCOUNTER — Other Ambulatory Visit (HOSPITAL_COMMUNITY): Payer: Self-pay

## 2022-03-07 ENCOUNTER — Other Ambulatory Visit (HOSPITAL_COMMUNITY): Payer: Self-pay

## 2022-03-08 ENCOUNTER — Other Ambulatory Visit (HOSPITAL_COMMUNITY): Payer: Self-pay

## 2022-04-04 ENCOUNTER — Encounter: Payer: Self-pay | Admitting: *Deleted

## 2022-04-18 ENCOUNTER — Other Ambulatory Visit (HOSPITAL_COMMUNITY): Payer: Self-pay

## 2022-04-19 ENCOUNTER — Other Ambulatory Visit (HOSPITAL_COMMUNITY): Payer: Self-pay

## 2022-04-21 ENCOUNTER — Other Ambulatory Visit (HOSPITAL_COMMUNITY): Payer: Self-pay

## 2022-04-25 ENCOUNTER — Other Ambulatory Visit (HOSPITAL_COMMUNITY): Payer: Self-pay

## 2022-05-12 ENCOUNTER — Other Ambulatory Visit (HOSPITAL_COMMUNITY): Payer: Self-pay

## 2022-05-12 MED ORDER — BUPROPION HCL ER (XL) 150 MG PO TB24
150.0000 mg | ORAL_TABLET | Freq: Every day | ORAL | 3 refills | Status: DC
  Filled 2022-05-12: qty 90, 90d supply, fill #0

## 2022-05-17 ENCOUNTER — Other Ambulatory Visit (HOSPITAL_COMMUNITY): Payer: Self-pay

## 2022-05-17 MED ORDER — DOXYCYCLINE MONOHYDRATE 100 MG PO TABS
100.0000 mg | ORAL_TABLET | Freq: Two times a day (BID) | ORAL | 0 refills | Status: AC
  Filled 2022-05-17: qty 20, 10d supply, fill #0

## 2022-05-23 ENCOUNTER — Other Ambulatory Visit (HOSPITAL_COMMUNITY): Payer: Self-pay

## 2022-06-01 ENCOUNTER — Other Ambulatory Visit (HOSPITAL_COMMUNITY): Payer: Self-pay

## 2022-06-07 ENCOUNTER — Other Ambulatory Visit (HOSPITAL_COMMUNITY): Payer: Self-pay

## 2022-06-10 ENCOUNTER — Ambulatory Visit: Payer: No Typology Code available for payment source | Admitting: Sports Medicine

## 2022-06-10 ENCOUNTER — Other Ambulatory Visit (HOSPITAL_COMMUNITY): Payer: Self-pay

## 2022-06-10 VITALS — BP 102/68 | HR 59 | Ht 70.0 in | Wt 202.0 lb

## 2022-06-10 DIAGNOSIS — M9903 Segmental and somatic dysfunction of lumbar region: Secondary | ICD-10-CM

## 2022-06-10 DIAGNOSIS — M533 Sacrococcygeal disorders, not elsewhere classified: Secondary | ICD-10-CM

## 2022-06-10 DIAGNOSIS — G8929 Other chronic pain: Secondary | ICD-10-CM

## 2022-06-10 DIAGNOSIS — M9904 Segmental and somatic dysfunction of sacral region: Secondary | ICD-10-CM

## 2022-06-10 DIAGNOSIS — M9905 Segmental and somatic dysfunction of pelvic region: Secondary | ICD-10-CM

## 2022-06-10 DIAGNOSIS — M9901 Segmental and somatic dysfunction of cervical region: Secondary | ICD-10-CM

## 2022-06-10 DIAGNOSIS — M545 Low back pain, unspecified: Secondary | ICD-10-CM

## 2022-06-10 DIAGNOSIS — S76011A Strain of muscle, fascia and tendon of right hip, initial encounter: Secondary | ICD-10-CM | POA: Diagnosis not present

## 2022-06-10 DIAGNOSIS — M542 Cervicalgia: Secondary | ICD-10-CM

## 2022-06-10 DIAGNOSIS — M9902 Segmental and somatic dysfunction of thoracic region: Secondary | ICD-10-CM

## 2022-06-10 MED ORDER — MELOXICAM 15 MG PO TABS
15.0000 mg | ORAL_TABLET | Freq: Every day | ORAL | 0 refills | Status: DC
  Filled 2022-06-10: qty 30, 30d supply, fill #0

## 2022-06-10 NOTE — Patient Instructions (Addendum)
Good to see you  HEP  - Start meloxicam 15 mg daily x2 weeks.  If still having pain after 2 weeks, complete 3rd-week of meloxicam. May use remaining meloxicam as needed once daily for pain control.  Do not to use additional NSAIDs while taking meloxicam.  May use Tylenol [id number] mg 2 to 3 times a day for breakthrough pain. Pt referral  6-8 week follow up

## 2022-06-10 NOTE — Progress Notes (Signed)
Aleen Sells [facility] [address] [place] [address] Phone: [phone]   Assessment and Plan:     1. Neck pain 2. SI (sacroiliac) pain 3. Chronic bilateral low back pain without sciatica 4. Muscle strain of right gluteal region, initial encounter 5. Somatic dysfunction of cervical region 6. Somatic dysfunction of thoracic region 7. Somatic dysfunction of lumbar region 8. Somatic dysfunction of pelvic region 9. Somatic dysfunction of sacral region -Chronic with exacerbation, subsequent visit - Recurrence of multiple musculoskeletal complaints with most prominent being in neck and low back and right glutes - Patient has received benefit from OMT in the past, however he feels that dysfunction generally recurs about 2 months after and now has multiple musculoskeletal complaints including popping of right hip, right glued pain, left glued pain, back pain, neck pain, right shoulder pain without any new MOI - Encouraged to restart HEP.  New handouts provided - We will start physical therapy.  Referral sent - Start meloxicam 15 mg daily x2 weeks.  If still having pain after 2 weeks, complete 3rd-week of meloxicam. May use remaining meloxicam as needed once daily for pain control.  Do not to use additional NSAIDs while taking meloxicam.  May use Tylenol [phone] mg 2 to 3 times a day for breakthrough pain. - Ambulatory referral to Physical Therapy   - Patient has received significant relief with OMT in the past.  Elects for repeat OMT today.  Tolerated well per note below. - Decision today to treat with OMT was based on Physical Exam   After verbal consent patient was treated with HVLA (high velocity low amplitude), ME (muscle energy), FPR (flex positional release), ST (soft tissue), PC/PD (Pelvic Compression/ Pelvic Decompression) techniques in cervical, sacral, thoracic, lumbar, and pelvic areas. Patient tolerated the procedure well with improvement in  symptoms.  Patient educated on potential side effects of soreness and recommended to rest, hydrate, and use Tylenol as needed for pain control.   Pertinent previous records reviewed include none   Follow Up: 6 to 8 weeks for reevaluation.  Could consider repeat OMT versus imaging   Subjective:   I, [name], am serving as a Neurosurgeon for Doctor Richardean Sale  Chief Complaint: OMT follow up    HPI:  06/11/21 Patient is a 26 year old male presenting with neck and back pain. Patient was last seen by Dr. Katrinka Blazing on 04/16/21 for this reason and had OMT. Today patient states  that lower back and shoulder blade in the middle are bugging him today    07/23/2021 Patient states that he's going good , neck has been hurting the last 3 days has been getting headaches in the afternoon, has been having mild shortness of breath feels like he cant get a deep breath pain in the shoulders and in the chest feels muscular     08/06/2021 Patient states that right knee started hurting last night ,low back is hurting, and neck is super tight    08/24/2021 Patient states that he's doing okay , back feels better now that he is in his own bed, still shoulder blade issue and occasional lower back , occasional neck stiffness but nothing significant    09/17/2021 Patient states that he overall well, still has tightness in between the shoulder blades and neck    10/14/2021 Patient states that he is doing good back and shoulder are good neck has been tight and wants that middle back cracked    02/25/2022 Patient  states neck is very stiff when he pops it no comfortable  and lowe back , would like some resources for general flexibility   06/10/2022 Patient states neck feels bad he isnt able to pop it himself he is very stiff, he also has hip pain,  SI joint discomfort , when he carries his son he has pain    Relevant Historical Information: GERD  Additional pertinent review of systems negative.  Current  Outpatient Medications  Medication Sig Dispense Refill   buPROPion (WELLBUTRIN XL) 150 MG 24 hr tablet Take 1 tablet (150 mg total) by mouth daily. 90 tablet 3   escitalopram (LEXAPRO) 10 MG tablet Take 1 tablet by mouth daily. (Patient taking differently: Take 5 mg by mouth daily.) 90 tablet 3   linaclotide (LINZESS) 145 MCG CAPS capsule Take 1 capsule by mouth daily as needed. 30 capsule 11   Lisdexamfetamine Dimesylate (VYVANSE) 60 MG CHEW Chew 1 tablet by mouth every morning. 90 tablet 0   Lisdexamfetamine Dimesylate (VYVANSE) 60 MG CHEW Chew 1 tablet by mouth once daily 30 tablet 0   Lisdexamfetamine Dimesylate (VYVANSE) 60 MG CHEW Take 1 chewable by mouth twice daily  in the morning and at lunch. 30 days 30 tablet 0   Lisdexamfetamine Dimesylate (VYVANSE) 60 MG CHEW Take 1 chewable tablet by mouth Once a day 30 days 30 tablet 0   meloxicam (MOBIC) 15 MG tablet Take 1 tablet (15 mg total) by mouth daily. 30 tablet 0   omeprazole (PRILOSEC) 20 MG capsule Take 1 capsule by mouth daily. 90 capsule 3   propranolol (INDERAL) 20 MG tablet Take 1 tablet by mouth 3 times a day as needed 30 tablet 0   tirzepatide (MOUNJARO) 10 MG/0.5ML Pen Inject 10 mg into the skin once a week. 6 mL 1   albuterol (VENTOLIN HFA) 108 (90 Base) MCG/ACT inhaler Inhale 2 puffs into the lungs every 6 (six) hours as needed for wheezing or shortness of breath. 8.5 g 0   budesonide-formoterol (SYMBICORT) 160-4.5 MCG/ACT inhaler Inhale 2 puffs into the lungs 2 (two) times daily. 10.2 g 12   buPROPion (WELLBUTRIN XL) 300 MG 24 hr tablet Take 1 tablet by mouth daily. (Patient not taking: Reported on 06/10/2022) 90 tablet 3   cyanocobalamin (,VITAMIN B-12,) 1000 MCG/ML injection Inject 31ml into the skin once a week. 2 mL 0   tirzepatide (MOUNJARO) 12.5 MG/0.5ML Pen Inject 12.5 mg into the skin once a week. 6 mL 2   tirzepatide (MOUNJARO) 7.5 MG/0.5ML Pen Inject 7.5 mg (1 pen) into the skin once a week. 6 mL 1   No current  facility-administered medications for this visit.      Objective:     Vitals:   06/10/22 1056  BP: 102/68  Pulse: (!) 59  SpO2: 99%  Weight: 202 lb (91.6 kg)  Height: 5\' 10"  (1.778 m)      Body mass index is 28.98 kg/m.    Physical Exam:     General: Well-appearing, cooperative, sitting comfortably in no acute distress.   OMT Physical Exam:  ASIS Compression Test: Positive Right Cervical: TTP paraspinal, C3 RRSL Sacrum: NTTP bilateral sacral base.  Negative sphinx Thoracic: TTP paraspinal, T7-9 RRSL Lumbar: TTP paraspinal, L2 RLSL Pelvis: Right anterior innominate with out flare  Electronically signed by:  [facility] 11:28 AM 06/10/22

## 2022-06-11 ENCOUNTER — Other Ambulatory Visit (HOSPITAL_COMMUNITY): Payer: Self-pay

## 2022-06-23 ENCOUNTER — Encounter: Payer: Self-pay | Admitting: *Deleted

## 2022-07-05 IMAGING — DX DG HIP (WITH OR WITHOUT PELVIS) 2-3V*L*
3 series · 3 of 3 positions shown · non-contrast
Comparison: None.

CLINICAL DATA: Chronic left hip pain without known injury.

EXAM:
DG HIP (WITH OR WITHOUT PELVIS) 2-3V LEFT

[pelvis ap]
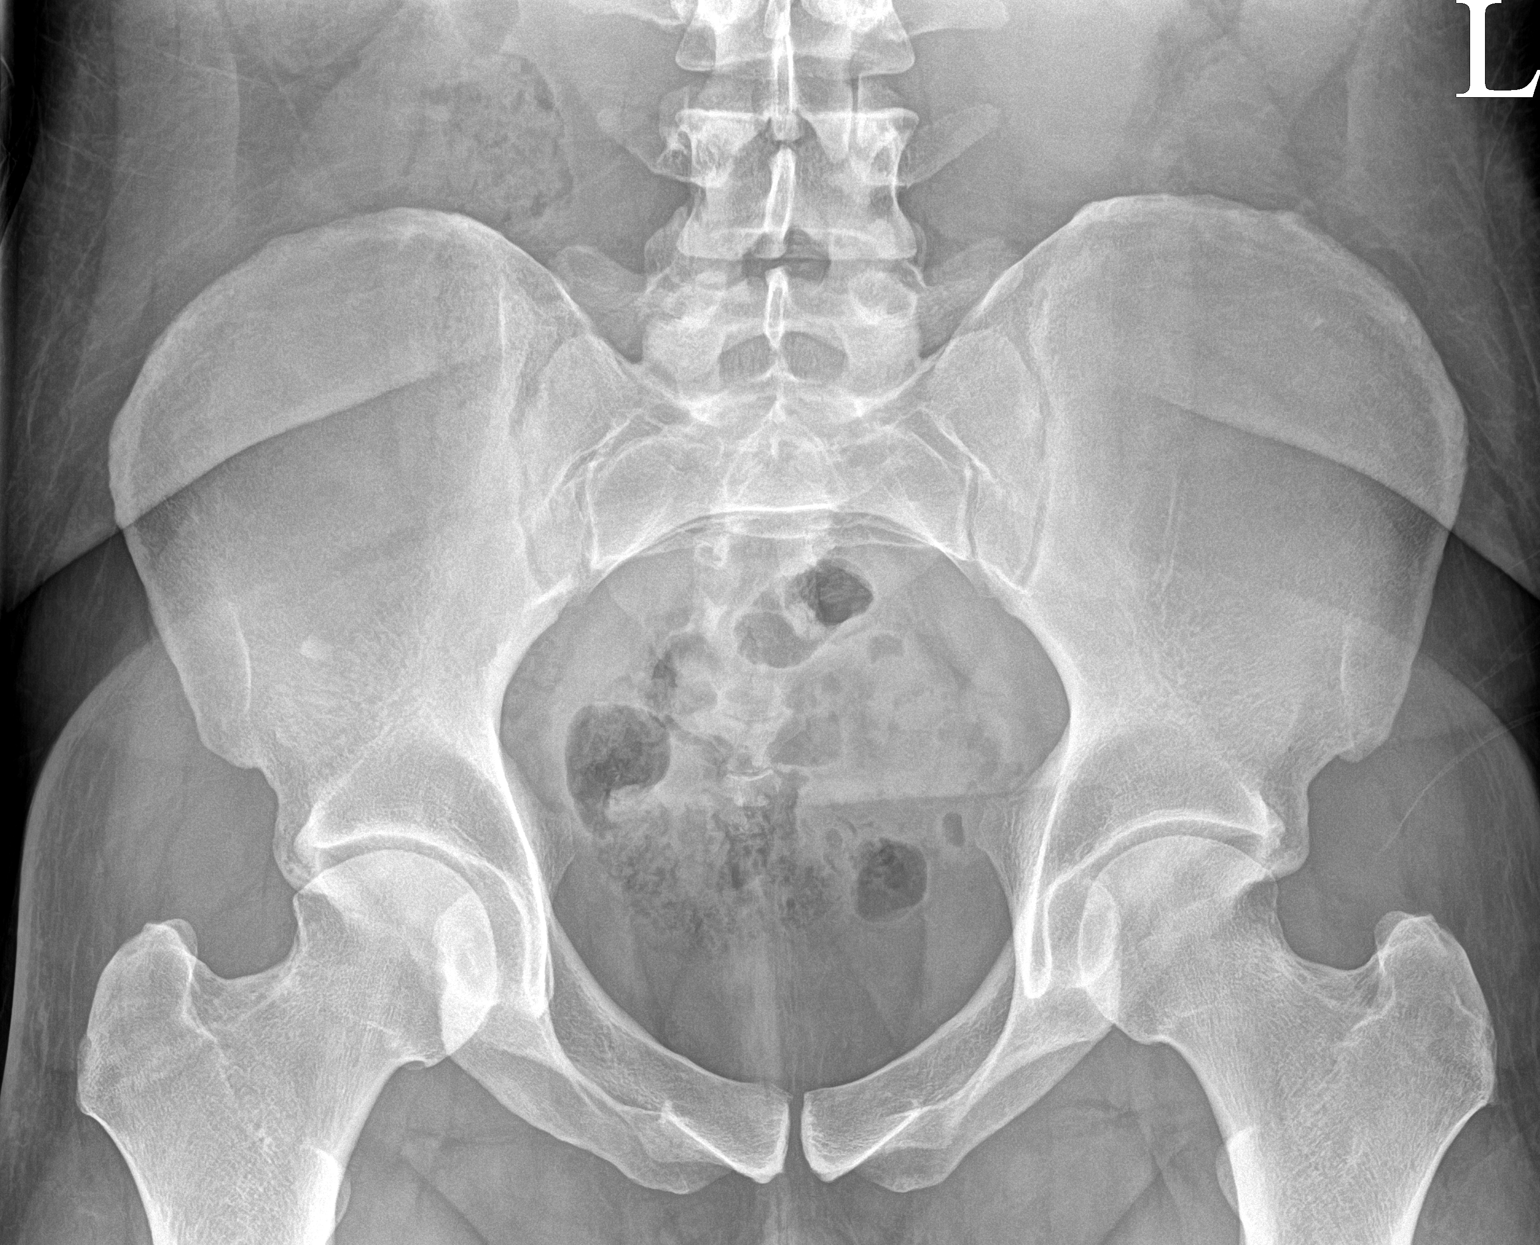

[hip ap]
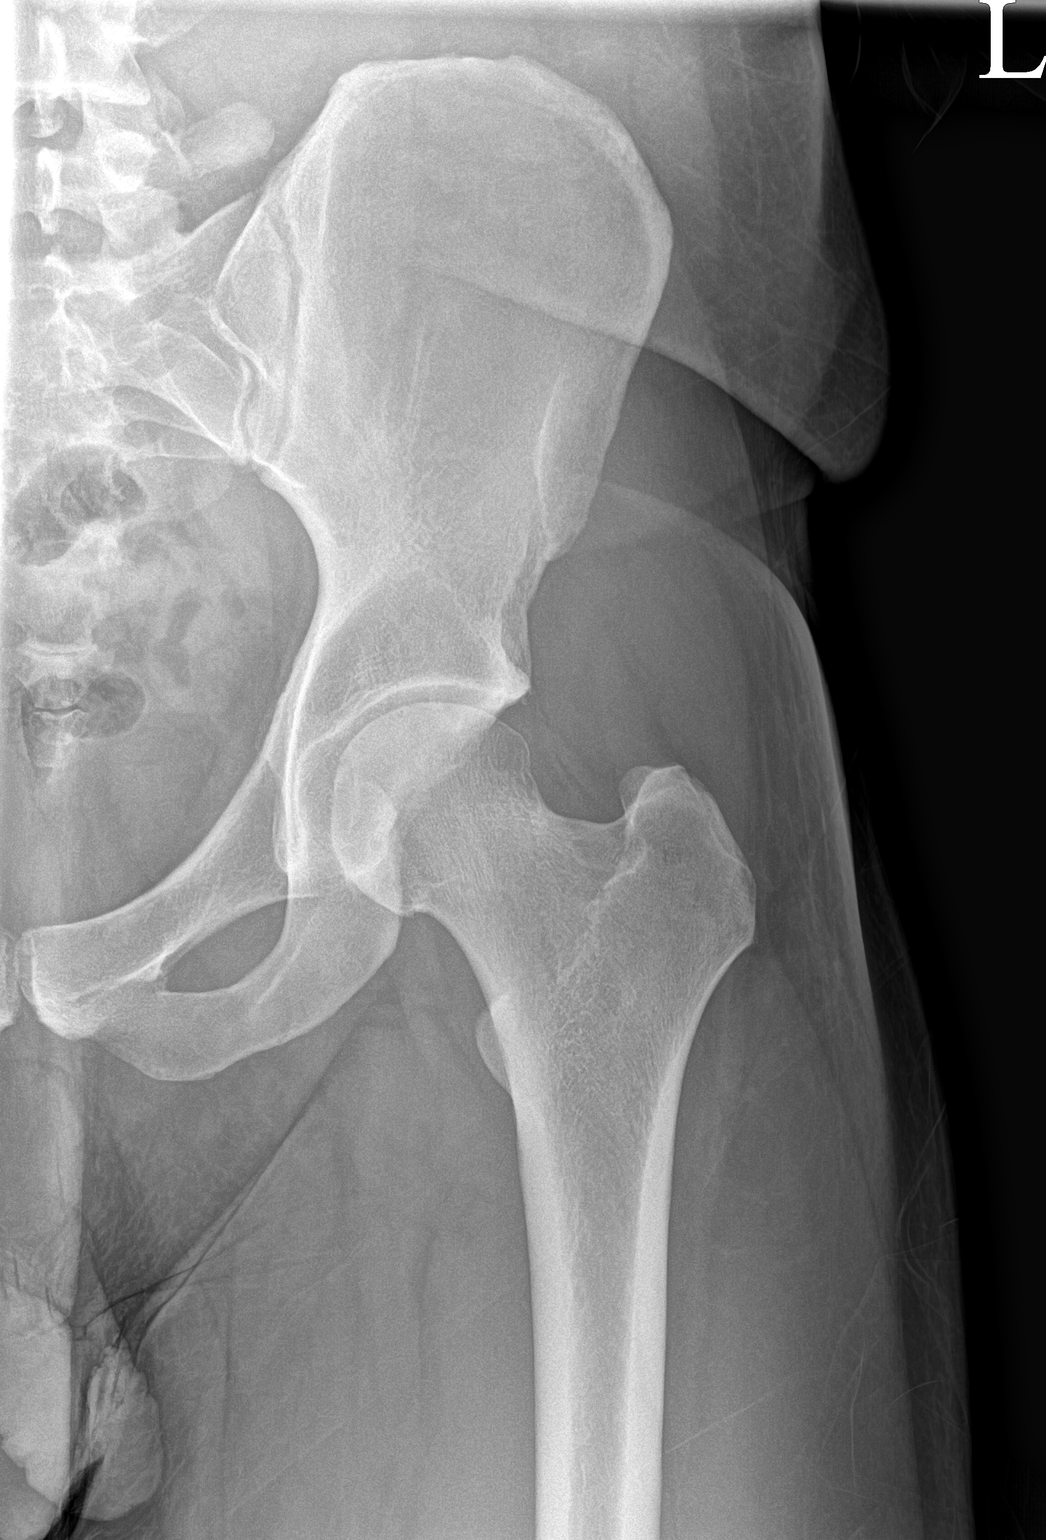

[hip frog leg]
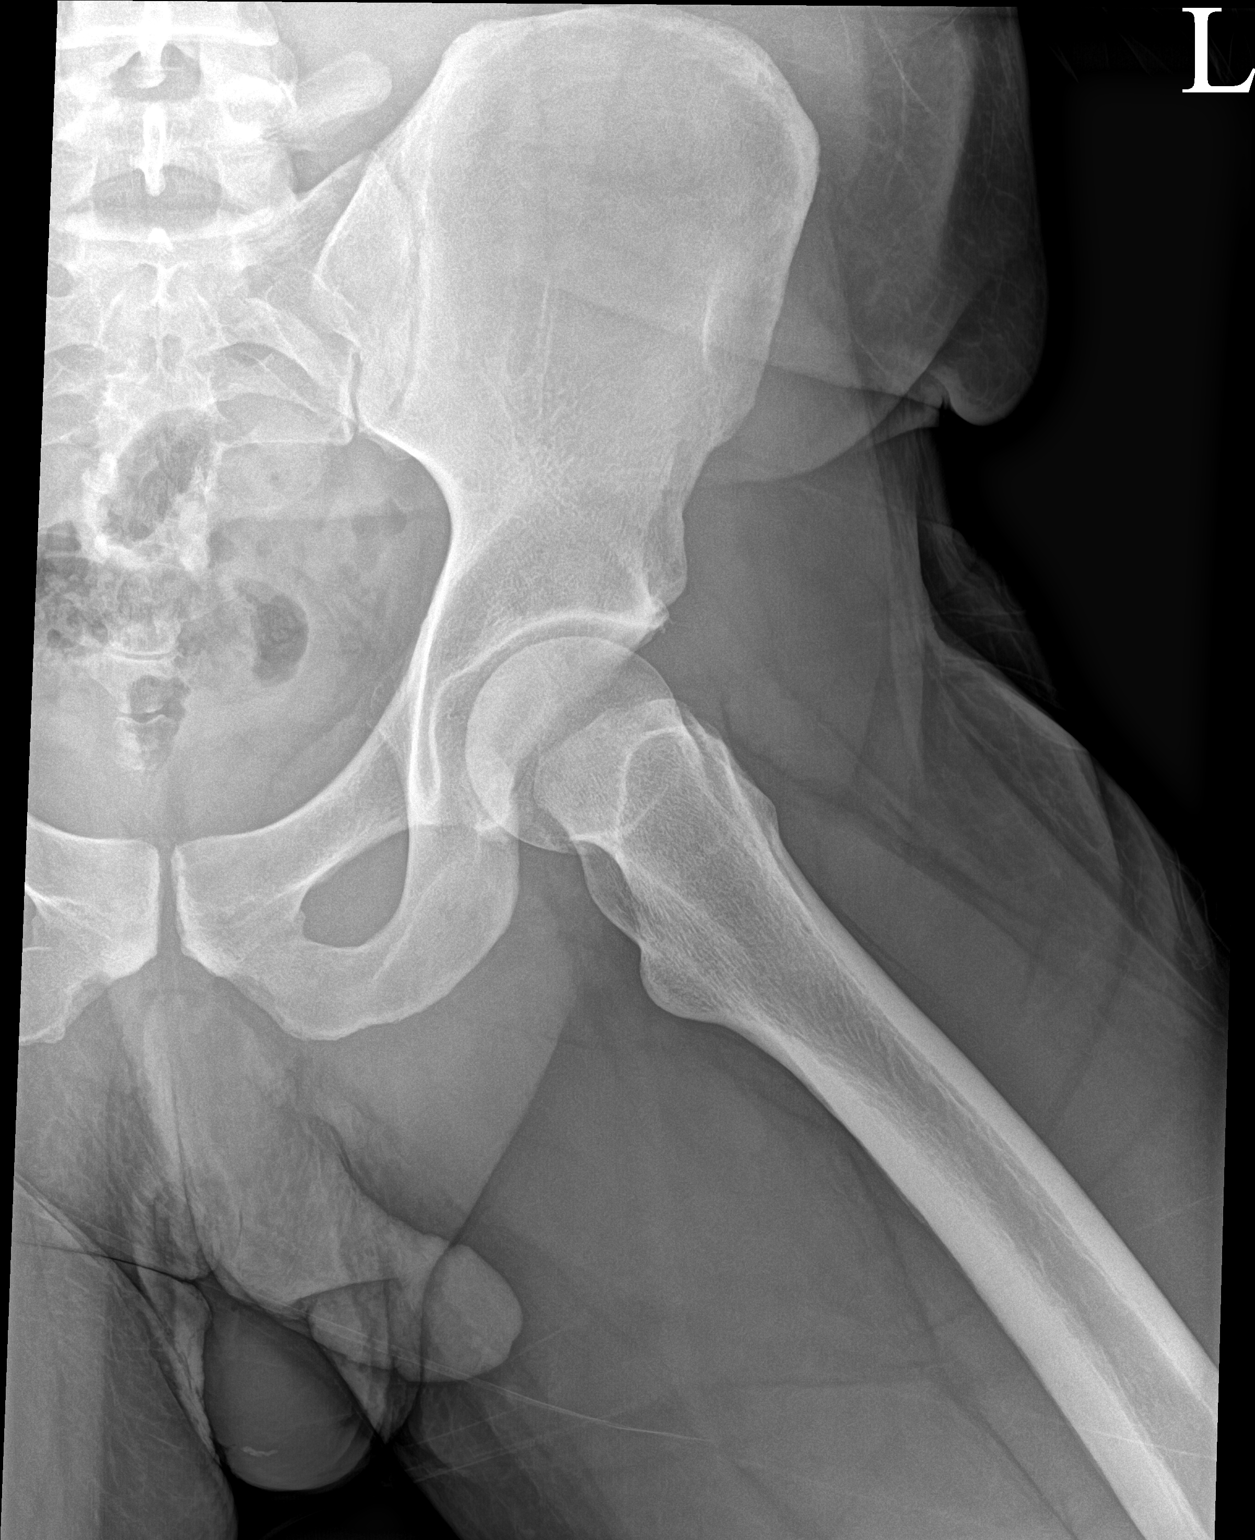

[3 of 3 positions shown; findings below may reference images not displayed]

FINDINGS: There is no evidence of hip fracture or dislocation. There is no
evidence of arthropathy or other focal bone abnormality.
IMPRESSION: Negative.

## 2022-07-07 ENCOUNTER — Other Ambulatory Visit (HOSPITAL_COMMUNITY): Payer: Self-pay

## 2022-07-07 MED ORDER — MOUNJARO 10 MG/0.5ML ~~LOC~~ SOAJ
10.0000 mg | SUBCUTANEOUS | 1 refills | Status: DC
  Filled 2022-07-07: qty 2, 28d supply, fill #0

## 2022-07-07 MED ORDER — ERGOCALCIFEROL 1.25 MG (50000 UT) PO CAPS
50000.0000 [IU] | ORAL_CAPSULE | ORAL | 0 refills | Status: AC
  Filled 2022-07-07 – 2022-07-19 (×2): qty 12, 84d supply, fill #0

## 2022-07-15 ENCOUNTER — Ambulatory Visit: Payer: No Typology Code available for payment source | Admitting: Sports Medicine

## 2022-07-15 NOTE — Therapy (Incomplete)
OUTPATIENT PHYSICAL THERAPY THORACOLUMBAR/ CERVICAL EVALUATION   Patient Name: Kent Griffith MRN: [id number] DOB:Jun 28, 1996, 27 y.o., male Today's Date: 07/15/2022  END OF SESSION:   Past Medical History:  Diagnosis Date   Anxiety    Depression    GERD (gastroesophageal reflux disease)    Hypothyroidism    IBS (irritable bowel syndrome)    Joint pain    Lactose intolerance    Obesity    Sleep apnea    Past Surgical History:  Procedure Laterality Date   KNEE ARTHROSCOPY, MEDIAL PATELLO FEMORAL LIGAMENT REPAIR Right    TONSILLECTOMY AND ADENOIDECTOMY     Patient Active Problem List   Diagnosis Date Noted   Scapular dyskinesis 03/04/2021   Hip flexor tightness 10/09/2020   Nonallopathic lesion of lumbar region 10/09/2020   Insomnia 09/04/2020   GAD (generalized anxiety disorder) 06/15/2020   Polyphagia, with IR 06/15/2020   Class 1 obesity due to excess calories with body mass index (BMI) of 31.0 to 31.9 in adult 02/05/2020   Subclinical hypothyroidism 09/04/2019   Arthritis of right knee 08/14/2019   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   Lactose intolerance in adult 08/14/2019   OSA (obstructive sleep apnea) 08/14/2019   GERD (gastroesophageal reflux disease) 11/10/2017   Depression, major, single episode, moderate ([place]) 11/10/2017   Anxiety 11/10/2017    PCP: Vivi Barrack, MD   REFERRING PROVIDER: Glennon Mac, DO   REFERRING DIAG:  M54.2 (ICD-10-CM) - Neck pain  M53.3 (ICD-10-CM) - SI (sacroiliac) pain  M54.50,G89.29 (ICD-10-CM) - Chronic bilateral low back pain without sciatica  S76.011A (ICD-10-CM) - Muscle strain of right gluteal region, initial encounter  M99.01 (ICD-10-CM) - Somatic dysfunction of cervical region  M99.02 (ICD-10-CM) - Somatic dysfunction of thoracic region  M99.03 (ICD-10-CM) - Somatic dysfunction of lumbar region  M99.05 (ICD-10-CM) - Somatic dysfunction of pelvic region  M99.04 (ICD-10-CM) - Somatic  dysfunction of sacral region    Rationale for Evaluation and Treatment: Rehabilitation  THERAPY DIAG:  No diagnosis found.  ONSET DATE: ***  SUBJECTIVE:                                                                                                                                                                                           SUBJECTIVE STATEMENT: ***  PERTINENT HISTORY:  ***  PAIN:  Are you having pain? Yes: NPRS scale: ***/10 Pain location: *** Pain description: *** Aggravating factors: *** Relieving factors: ***  PRECAUTIONS: {Therapy precautions:24002}  WEIGHT BEARING RESTRICTIONS: {Yes ***/No:24003}  FALLS:  Has patient fallen in last 6 months? {fallsyesno:27318}  LIVING ENVIRONMENT: Lives with: {[facility] lives with:25569::"lives with their family"}  Lives in: {Lives in:25570} Stairs: {opstairs:27293} Has following equipment at home: {Assistive devices:23999}  OCCUPATION: ***  PLOF: {PLOF:24004}  PATIENT GOALS: ***  NEXT MD VISIT:   OBJECTIVE:   DIAGNOSTIC FINDINGS:  N/A  PATIENT SURVEYS:  {rehab surveys:24030}  SCREENING FOR RED FLAGS: Bowel or bladder incontinence: {Yes/No:304960894} Cauda equina syndrome: {Yes/No:304960894}  COGNITION: Overall cognitive status: {cognition:24006}     SENSATION: {sensation:27233}  MUSCLE LENGTH: Hamstrings: Right *** deg; Left *** deg Thomas test: Right *** deg; Left *** deg  POSTURE: {posture:25561}  PALPATION: ***  PASSIVE ACCESSORIES: ***  LUMBAR ROM:   AROM eval  Flexion   Extension   Right lateral flexion   Left lateral flexion   Right rotation   Left rotation    (Blank rows = not tested)   LOWER EXTREMITY MMT:    MMT Right eval Left eval  Hip flexion    Hip extension    Hip abduction    Hip internal rotation    Hip external rotation    Knee flexion    Knee extension    Ankle dorsiflexion    Ankle plantarflexion     (Blank rows = not tested)   CERVICAL ROM:    AROM A/PROM (deg) 07/16/2022  Flexion   Extension   Right lateral flexion   Left lateral flexion   Right rotation   Left rotation    (Blank rows = not tested)   UE MMT:  MMT Right 07/16/2022 Left 07/16/2022  Shoulder flexion    Shoulder abduction    Shoulder adduction    Shoulder internal rotation    Shoulder external rotation    Middle trapezius    Lower trapezius    Latissimus dorsi     (Blank rows = not tested)    SPECIAL TESTS:  ***  FUNCTIONAL TESTS:  Forearm plank: DNF endurance test: Squat: 5xSTS:  GAIT: Distance walked: *** Assistive device utilized: {Assistive devices:23999} Level of assistance: {Levels of assistance:24026} Comments: ***  TODAY'S TREATMENT:                                                                                                                               [facility] Adult PT Treatment:                                                DATE: 07/15/2022 Therapeutic Exercise: *** Manual Therapy: *** Neuromuscular re-ed: *** Therapeutic Activity: *** Modalities: *** Self Care: ***    PATIENT EDUCATION:  Education details: Pt educated on potential underlying pathophysiology, POC, prognosis, FOTO, and HEP Person educated: Patient Education method: Programmer, multimedia, Demonstration, and Handouts Education comprehension: verbalized understanding and returned demonstration  HOME EXERCISE PROGRAM: ***  ASSESSMENT:  CLINICAL IMPRESSION: Patient is a *** y.o. *** who was seen today for physical therapy evaluation and treatment for ***.  OBJECTIVE IMPAIRMENTS: {opptimpairments:25111}.   ACTIVITY LIMITATIONS: {activitylimitations:27494}  PARTICIPATION LIMITATIONS: {participationrestrictions:25113}  PERSONAL FACTORS: {Personal factors:25162} are also affecting patient's functional outcome.   REHAB POTENTIAL: {rehabpotential:25112}  CLINICAL DECISION MAKING: {clinical decision making:25114}  EVALUATION COMPLEXITY: {Evaluation  complexity:25115}   GOALS: Goals reviewed with patient? Yes  SHORT TERM GOALS: Target date: 08/13/2022  Pt will report understanding and adherence to initial HEP in order to promote independence in the management of primary impairments. Baseline: HEP provided at eval Goal status: INITIAL  2.  *** Baseline:  Goal status: {GOALSTATUS:25110}  3.  *** Baseline:  Goal status: {GOALSTATUS:25110}   LONG TERM GOALS: Target date: 09/10/2022  Pt will achieve a FOTO score of *** in order to demonstrate improved functional ability as it relates to the pt's primary impairments. Baseline: *** Goal status: INITIAL  2.  *** Baseline:  Goal status: {GOALSTATUS:25110}  3.  *** Baseline:  Goal status: {GOALSTATUS:25110}  4.  *** Baseline:  Goal status: {GOALSTATUS:25110}  5.  *** Baseline:  Goal status: {GOALSTATUS:25110}  6.  *** Baseline:  Goal status: {GOALSTATUS:25110}  PLAN:  PT FREQUENCY: {rehab frequency:25116}  PT DURATION: {rehab duration:25117}  PLANNED INTERVENTIONS: {rehab planned interventions:25118::"Therapeutic exercises","Therapeutic activity","Neuromuscular re-education","Balance training","Gait training","Patient/Family education","Self Care","Joint mobilization"}.  PLAN FOR NEXT SESSION: Vanessa [place], PT, DPT 07/15/22 12:27 PM

## 2022-07-16 ENCOUNTER — Ambulatory Visit: Payer: No Typology Code available for payment source

## 2022-07-16 ENCOUNTER — Other Ambulatory Visit (HOSPITAL_COMMUNITY): Payer: Self-pay

## 2022-07-18 ENCOUNTER — Other Ambulatory Visit (HOSPITAL_COMMUNITY): Payer: Self-pay

## 2022-07-19 ENCOUNTER — Other Ambulatory Visit (HOSPITAL_COMMUNITY): Payer: Self-pay

## 2022-07-19 MED ORDER — NALTREXONE HCL 50 MG PO TABS
ORAL_TABLET | ORAL | 3 refills | Status: DC
  Filled 2022-07-19: qty 30, 30d supply, fill #0
  Filled 2022-08-26: qty 30, 30d supply, fill #1

## 2022-07-19 MED ORDER — LINZESS 290 MCG PO CAPS
ORAL_CAPSULE | ORAL | 3 refills | Status: DC
  Filled 2022-07-19: qty 30, 30d supply, fill #0
  Filled 2022-08-26: qty 30, 30d supply, fill #1

## 2022-07-21 ENCOUNTER — Other Ambulatory Visit (HOSPITAL_COMMUNITY): Payer: Self-pay

## 2022-07-22 ENCOUNTER — Other Ambulatory Visit: Payer: Self-pay

## 2022-07-22 ENCOUNTER — Ambulatory Visit: Payer: No Typology Code available for payment source | Attending: Sports Medicine

## 2022-07-22 ENCOUNTER — Encounter (HOSPITAL_COMMUNITY): Payer: Self-pay

## 2022-07-22 ENCOUNTER — Other Ambulatory Visit (HOSPITAL_COMMUNITY): Payer: Self-pay

## 2022-07-22 DIAGNOSIS — M9904 Segmental and somatic dysfunction of sacral region: Secondary | ICD-10-CM | POA: Diagnosis not present

## 2022-07-22 DIAGNOSIS — M9901 Segmental and somatic dysfunction of cervical region: Secondary | ICD-10-CM | POA: Diagnosis not present

## 2022-07-22 DIAGNOSIS — G8929 Other chronic pain: Secondary | ICD-10-CM | POA: Insufficient documentation

## 2022-07-22 DIAGNOSIS — M533 Sacrococcygeal disorders, not elsewhere classified: Secondary | ICD-10-CM | POA: Diagnosis not present

## 2022-07-22 DIAGNOSIS — M9902 Segmental and somatic dysfunction of thoracic region: Secondary | ICD-10-CM | POA: Insufficient documentation

## 2022-07-22 DIAGNOSIS — M9903 Segmental and somatic dysfunction of lumbar region: Secondary | ICD-10-CM | POA: Diagnosis not present

## 2022-07-22 DIAGNOSIS — M9905 Segmental and somatic dysfunction of pelvic region: Secondary | ICD-10-CM | POA: Diagnosis not present

## 2022-07-22 DIAGNOSIS — M542 Cervicalgia: Secondary | ICD-10-CM

## 2022-07-22 DIAGNOSIS — M6281 Muscle weakness (generalized): Secondary | ICD-10-CM | POA: Diagnosis present

## 2022-07-22 DIAGNOSIS — M545 Low back pain, unspecified: Secondary | ICD-10-CM | POA: Diagnosis present

## 2022-07-22 DIAGNOSIS — M546 Pain in thoracic spine: Secondary | ICD-10-CM | POA: Diagnosis present

## 2022-07-22 DIAGNOSIS — S76011A Strain of muscle, fascia and tendon of right hip, initial encounter: Secondary | ICD-10-CM | POA: Diagnosis not present

## 2022-07-22 NOTE — Therapy (Signed)
OUTPATIENT PHYSICAL THERAPY THORACOLUMBAR/ CERVICAL EVALUATION   Patient Name: Kent Griffith MRN: [id number] DOB:1996-04-08, 27 y.o., male Today's Date: 07/22/2022  END OF SESSION:  PT End of Session - 07/22/22 1145     Visit Number 1    Number of Visits 9    Date for PT Re-Evaluation 09/23/22    Authorization Type [employer]    Authorization Time Period FOTO v6, v10    PT Start Time 1126    PT Stop Time 1213    PT Time Calculation (min) 47 min    Activity Tolerance Patient tolerated treatment well    Behavior During Therapy WFL for tasks assessed/performed             Past Medical History:  Diagnosis Date   Anxiety    Depression    GERD (gastroesophageal reflux disease)    Hypothyroidism    IBS (irritable bowel syndrome)    Joint pain    Lactose intolerance    Obesity    Sleep apnea    Past Surgical History:  Procedure Laterality Date   KNEE ARTHROSCOPY, MEDIAL PATELLO FEMORAL LIGAMENT REPAIR Right    TONSILLECTOMY AND ADENOIDECTOMY     Patient Active Problem List   Diagnosis Date Noted   Scapular dyskinesis 03/04/2021   Hip flexor tightness 10/09/2020   Nonallopathic lesion of lumbar region 10/09/2020   Insomnia 09/04/2020   GAD (generalized anxiety disorder) 06/15/2020   Polyphagia, with IR 06/15/2020   Class 1 obesity due to excess calories with body mass index (BMI) of 31.0 to 31.9 in adult 02/05/2020   Subclinical hypothyroidism 09/04/2019   Arthritis of right knee 08/14/2019   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   Lactose intolerance in adult 08/14/2019   OSA (obstructive sleep apnea) 08/14/2019   GERD (gastroesophageal reflux disease) 11/10/2017   Depression, major, single episode, moderate ([place]) 11/10/2017   Anxiety 11/10/2017    PCP: Vivi Barrack, MD   REFERRING PROVIDER: Glennon Mac, DO   REFERRING DIAG:  M54.2 (ICD-10-CM) - Neck pain  M53.3 (ICD-10-CM) - SI (sacroiliac) pain  M54.50,G89.29 (ICD-10-CM) -  Chronic bilateral low back pain without sciatica  S76.011A (ICD-10-CM) - Muscle strain of right gluteal region, initial encounter  M99.01 (ICD-10-CM) - Somatic dysfunction of cervical region  M99.02 (ICD-10-CM) - Somatic dysfunction of thoracic region  M99.03 (ICD-10-CM) - Somatic dysfunction of lumbar region  M99.05 (ICD-10-CM) - Somatic dysfunction of pelvic region  M99.04 (ICD-10-CM) - Somatic dysfunction of sacral region    Rationale for Evaluation and Treatment: Rehabilitation  THERAPY DIAG:  Cervicalgia - Plan: PT plan of care cert/re-cert  Lumbar pain - Plan: PT plan of care cert/re-cert  Pain in thoracic spine - Plan: PT plan of care cert/re-cert  Muscle weakness (generalized) - Plan: PT plan of care cert/re-cert  ONSET DATE: 2-3 years ago  SUBJECTIVE:  SUBJECTIVE STATEMENT: Pt reports primary c/o neck and global back pain of insidious onset lasting a few years and worsening over the past few months. Pt reports he lost 140 pounds intentionally over 4 years and believes his pain may be related to not doing weight training/ exercise while losing weight. He also reports BIL Rt>Lt gluteal pain with prolonged sitting that can travel to the posterior mid thigh.  Pt denies any N/T in his UE or LE related to this problem. He also denies any hx of injury to his back or neck. Pt reports frontal to temporal headaches about twice per week that last a few hours at a time. Current pain is 0/10. Worst pain is 4/10. Aggravating factors include thoracic rotation, getting out of bed in the morning, turning head, sitting > 5-10 minutes (gluteal pain). Easing factors include positional change, walking, pain medication, heat. Pt denies any bowel/ bladder changes, saddle anesthesia, nausea/ vomiting, unexplained weight  change, dizziness, dysphagia, dysarthria, diplopia.   PERTINENT HISTORY:  Anxiety, depression, sleep apnea, prior Rt MPFL repair  PAIN:  Are you having pain? Yes: NPRS scale: 0/10 Pain location: Neck, mid-low back, Rt glute Pain description: Achy Aggravating factors: thoracic rotation, getting out of bed in the morning, turning head, sitting > 5-10 minutes (gluteal pain) Relieving factors: positional change, walking, pain medication  PRECAUTIONS: None  WEIGHT BEARING RESTRICTIONS: No  FALLS:  Has patient fallen in last 6 months? No  LIVING ENVIRONMENT: Lives with: lives with their family Lives in: House/apartment Stairs: Yes: Internal: 15 steps; on right going up, on left going up, and can reach both and External: 4 steps; on right going up, on left going up, and can reach both Has following equipment at home: None  OCCUPATION: Nurse  PLOF: Independent  PATIENT GOALS: Return to driving, ADLs with less limitation, exercising  NEXT MD VISIT:   Screening for Suicide  Answer the following questions with Yes or No and place an "x" beside the action taken.  1. Over the past two weeks, have you felt down, depressed, or hopeless?   No  2. Within the past two weeks, have you felt little interest or pleasure in life?  No  If YES to either #1 or #2, then ask #3  3. Have you had thoughts that that life is not worth living or that you might be       better off dead?     If answer is NO and suspicion is low, then end   4. Over this past week, have you had any thoughts about hurting or even killing yourself?    If NO, then end. Patient in no immediate danger   5. If so, do you believe that you intend to or will harm yourself?       If NO, then end. Patient in no immediate danger   6.  Do you have a plan as to how you would hurt yourself?     7.  Over this past week, have you actually done anything to hurt yourself?    IF YES answers to either #4, #5, #6 or #7, then  patient is AT RISK for suicide   Actions Taken  __X__  Screening negative; no further action required  ____  Screening positive; no immediate danger and patient already in treatment with a  mental health provider. Advise patient to speak to their mental health provider.  ____  Screening positive; no immediate danger. Patient advised to contact a mental  health provider for further assessment.   ____  Screening positive; in immediate danger as patient states intention of killing self,  has plan and a sense of imminence. Do not leave alone. Seek permission from  patient to contact a family member to inform them. Direct patient to go to ED.   OBJECTIVE:   DIAGNOSTIC FINDINGS:  N/A  PATIENT SURVEYS:  FOTO 68%, predicted 75% in 19 visits  SCREENING FOR RED FLAGS: Bowel or bladder incontinence: No Cauda equina syndrome: No  COGNITION: Overall cognitive status: Within functional limits for tasks assessed     SENSATION: Not tested  MUSCLE LENGTH: Thomas test: Moderate limitation BIL  POSTURE: rounded shoulders, forward head, and increased lumbar lordosis  PALPATION: TTP to Lt mid trap with palpable trigger points, TTP to BIL piriformis/ mid glute max  PASSIVE ACCESSORIES: Hypomobile cervicothoracic CPAs with pain at C3-C4, C6-T4; Lumbar passive accessories hypomobile with no pain  LUMBAR ROM:   AROM eval  Flexion WNL  Extension WNL, LBP  Right lateral flexion WNL  Left lateral flexion WNL  Right rotation WNL, hip and scapular pain  Left rotation WNL, hip and scapular pain   (Blank rows = not tested)   LOWER EXTREMITY MMT:    MMT Right eval Left eval  Hip flexion 5/5 5/5  Hip extension 5/5 5/5  Hip abduction 4/5 4/5   (Blank rows = not tested)   CERVICAL ROM:   AROM A/PROM (deg) 07/16/2022  Flexion 50  Extension 65, minor p!  Right lateral flexion 60  Left lateral flexion 60  Right rotation 80  Left rotation 80   (Blank rows = not tested)   UE  MMT:  MMT Right 07/16/2022 Left 07/16/2022  Shoulder flexion 5/5 5/5  Shoulder abduction 5/5 5/5  Shoulder adduction 5/5 5/5  Shoulder internal rotation 5/5 5/5  Shoulder external rotation 5/5 5/5  Elbow flexion 5/5 5/5  Elbow extension 5/5 5/5  Middle trapezius 4/5 5/5  Lower trapezius 3/5 3+/5  Latissimus dorsi 4/5 4+/5   (Blank rows = not tested)    SPECIAL TESTS:  Cervical quadrant test: PLE: (-) Piriformis test: (+) FABER: (+) on Lt for anterior hip pain FADDIR: (+) BIL for anterior hip pain Hip scour: (-) BIL  FUNCTIONAL TESTS:  Forearm plank: 70 seconds, PT terminated DNF endurance test: 45 seconds Squat: WNL, anterior hip pain   TODAY'S TREATMENT:                                                                                                                               [facility] Adult PT Treatment:                                                DATE: 07/15/2022 Demonstrated and issued HEP    PATIENT EDUCATION:  Education  details: Pt educated on potential underlying pathophysiology, POC, prognosis, FOTO, and HEP Person educated: Patient Education method: Explanation, Demonstration, and Handouts Education comprehension: verbalized understanding and returned demonstration  HOME EXERCISE PROGRAM: Access Code: [email or web address] URL: [email or web address] Date: 07/22/2022 Prepared by: Vanessa Edon  Exercises - Seated Cervical Retraction  - 1 x daily - 7 x weekly - 3 sets - 10 reps - 5-sec hold - Standing Shoulder Row with Anchored Resistance  - 1 x daily - 7 x weekly - 3 sets - 10 reps - 3 seconds hold - Oblique Bicycle Crunch  - 1 x daily - 7 x weekly - 3 sets - 20 reps - Squat  - 1 x daily - 7 x weekly - 3 sets - 15 reps - Full Superman on Table  - 1 x daily - 7 x weekly - 3 sets - 10 reps  ASSESSMENT:  CLINICAL IMPRESSION: Patient is a 27 y.o. M who was seen today for physical therapy evaluation and treatment for chronic neck and back pain.  Upon  assessment, his primary impairments include painful cervical extension AROM, painful trunk extension AROM, tight BIL hip flexors, painful and hypomobile cervicothoracic passive accessory mobility, and TTP to Lt mid trap and BIL piriformis/ mid glute max. Ruling up mechanical neck and back pain related to associated functional weakness. Cannot rule out piriformis syndrome due to report of radicular pain from buttock to posterior thigh with sitting, although negative piriformis test decreases this likelihood. Also cannot rule out hip pathology due to positive FABER and FADDIR, although negative hip scour decreases likelihood of intraarticular pathology. Pt will benefit from skilled PT to address his primary impairments and return to his prior level of function with less limitation.  OBJECTIVE IMPAIRMENTS: decreased endurance, decreased ROM, decreased strength, hypomobility, impaired flexibility, impaired UE functional use, improper body mechanics, postural dysfunction, and pain.   ACTIVITY LIMITATIONS: carrying, lifting, bending, sitting, standing, squatting, bed mobility, and dressing  PARTICIPATION LIMITATIONS: cleaning, laundry, driving, shopping, community activity, occupation, and yard work  PERSONAL FACTORS: 1-2 comorbidities: Anxiety, depression  are also affecting patient's functional outcome.   REHAB POTENTIAL: Good  CLINICAL DECISION MAKING: Stable/uncomplicated  EVALUATION COMPLEXITY: Low   GOALS: Goals reviewed with patient? Yes  SHORT TERM GOALS: Target date: 08/13/2022  Pt will report understanding and adherence to initial HEP in order to promote independence in the management of primary impairments. Baseline: HEP provided at eval Goal status: INITIAL   LONG TERM GOALS: Target date: 09/10/2022  Pt will achieve a FOTO score of 75% in order to demonstrate improved functional ability as it relates to the pt's primary impairments. Baseline: 68% Goal status: INITIAL  2.  Pt will  report ability to sit > 30 minutes with 0-1/10 pain in order to go on car trips with less limitation. Baseline: 4/10 pain with 5-10 minutes of sitting Goal status: INITIAL  3.  Pt will achieve WNL trunk extension AROM with 0-1/10 pain in order to get dressed with less limitation. Baseline: 25% ROM with 4/10 pain Goal status: INITIAL  4.  Pt will achieve DNF endurance test of 70 seconds in order to promote functional neck strength in the prophylaxis of future mechanical cervical pain. Baseline: 45 seconds Goal status: INITIAL   PLAN:  PT FREQUENCY: 1x/week  PT DURATION: 8 weeks  PLANNED INTERVENTIONS: Therapeutic exercises, Therapeutic activity, Neuromuscular re-education, Balance training, Gait training, Patient/Family education, Self Care, Joint mobilization, Joint manipulation, Vestibular training, Aquatic Therapy, Dry Needling, Electrical stimulation, Spinal manipulation, Spinal mobilization, Cryotherapy,  Moist heat, Taping, Vasopneumatic device, Traction, Biofeedback, Ionotophoresis 4mg /ml Dexamethasone, Manual therapy, and Re-evaluation.  PLAN FOR NEXT SESSION: Progress DNF/ parascapular/ core/ hip strengthening; OMT/ TDN PRN   , PT, DPT 07/22/22 12:33 PM

## 2022-07-25 ENCOUNTER — Other Ambulatory Visit (HOSPITAL_COMMUNITY): Payer: Self-pay

## 2022-07-25 MED ORDER — LISDEXAMFETAMINE DIMESYLATE 60 MG PO CHEW
1.0000 | CHEWABLE_TABLET | Freq: Every day | ORAL | 0 refills | Status: DC
  Filled 2022-07-25 – 2022-08-10 (×2): qty 30, 30d supply, fill #0

## 2022-07-27 ENCOUNTER — Other Ambulatory Visit: Payer: Self-pay

## 2022-07-28 NOTE — Progress Notes (Signed)
Benito Mccreedy D.Tift Frederika [place] Phone: [phone]   Assessment and Plan:     1. Chronic bilateral thoracic back pain 2. Somatic dysfunction of thoracic region 3. Somatic dysfunction of rib region 4. Somatic dysfunction of upper extremities  -Chronic with exacerbation, subsequent visit - Recurrence of multiple musculoskeletal complaints with most prominent being in middle back and between shoulder blades - Continue HEP and physical therapy - Patient completed 2-week course of meloxicam.  Discontinue meloxicam at this time - Patient has received relief with OMT in the past.  Elects for repeat OMT today.  Tolerated well per note below. - Decision today to treat with OMT was based on Physical Exam   After verbal consent patient was treated with HVLA (high velocity low amplitude), ME (muscle energy), FPR (flex positional release), ST (soft tissue), PC/PD (Pelvic Compression/ Pelvic Decompression) techniques in cervical, rib, thoracic, lumbar, and pelvic areas. Patient tolerated the procedure well with improvement in symptoms.  Patient educated on potential side effects of soreness and recommended to rest, hydrate, and use Tylenol as needed for pain control.   Pertinent previous records reviewed include none   Follow Up: 4 weeks for reevaluation.  If no improvement or worsening of symptoms, could consider x-ray imaging versus advanced imaging of painful areas.  Could consider repeat OMT   Subjective:   I, [name], am serving as a Education administrator for Doctor Glennon Mac  Chief Complaint: OMT follow up    HPI:  06/11/21 Patient is a 27 year old male presenting with neck and back pain. Patient was last seen by Dr. Tamala Julian on 04/16/21 for this reason and had OMT. Today patient states  that lower back and shoulder blade in the middle are bugging him today    07/23/2021 Patient states that he's going good , neck has been hurting the  last 3 days has been getting headaches in the afternoon, has been having mild shortness of breath feels like he cant get a deep breath pain in the shoulders and in the chest feels muscular     08/06/2021 Patient states that right knee started hurting last night ,low back is hurting, and neck is super tight    08/24/2021 Patient states that he's doing okay , back feels better now that he is in his own bed, still shoulder blade issue and occasional lower back , occasional neck stiffness but nothing significant    09/17/2021 Patient states that he overall well, still has tightness in between the shoulder blades and neck    10/14/2021 Patient states that he is doing good back and shoulder are good neck has been tight and wants that middle back cracked    02/25/2022 Patient states neck is very stiff when he pops it no comfortable  and lowe back , would like some resources for general flexibility    06/10/2022 Patient states neck feels bad he isnt able to pop it himself he is very stiff, he also has hip pain,  SI joint discomfort , when he carries his son he has pain    07/29/2022 Patient states fine , back pain a little , meloxicam helped took about a week to kick in , is going to start pt and getting back in the gym     Relevant Historical Information: GERD  Additional pertinent review of systems negative.  Current Outpatient Medications  Medication Sig Dispense Refill   buPROPion (WELLBUTRIN XL) 300 MG 24 hr tablet  Take 1 tablet by mouth daily. 90 tablet 3   ergocalciferol (VITAMIN D2) 1.25 MG (50000 UT) capsule Take 1 capsule by mouth once a week. 12 capsule 0   escitalopram (LEXAPRO) 10 MG tablet Take 1 tablet by mouth daily. (Patient taking differently: Take 5 mg by mouth daily.) 90 tablet 3   linaclotide (LINZESS) 290 MCG CAPS capsule Take 1 capsule once a day at least 30 minutes before the first meal of the day on an empty stomach. 30 capsule 3   Lisdexamfetamine Dimesylate (VYVANSE) 60  MG CHEW Chew 1 tablet by mouth every morning. 90 tablet 0   Lisdexamfetamine Dimesylate (VYVANSE) 60 MG CHEW Chew 1 tablet by mouth once daily 30 tablet 0   Lisdexamfetamine Dimesylate (VYVANSE) 60 MG CHEW Take 1 chewable by mouth twice daily  in the morning and at lunch. 30 days 30 tablet 0   Lisdexamfetamine Dimesylate (VYVANSE) 60 MG CHEW Chew 1 tablet by mouth daily. 30 tablet 0   naltrexone (DEPADE) 50 MG tablet Take 1 tablet by mouth once a day. 30 tablet 3   omeprazole (PRILOSEC) 20 MG capsule Take 1 capsule by mouth daily. 90 capsule 3   propranolol (INDERAL) 20 MG tablet Take 1 tablet by mouth 3 times a day as needed 30 tablet 0   No current facility-administered medications for this visit.      Objective:     Vitals:   07/29/22 1103  BP: 100/80  Pulse: 81  SpO2: 97%  Weight: 211 lb (95.7 kg)  Height: 5\' 10"  (1.778 m)      Body mass index is 30.28 kg/m.    Physical Exam:     General: Well-appearing, cooperative, sitting comfortably in no acute distress.   OMT Physical Exam:    Rib: Bilateral elevated first rib with TTP Thoracic: TTP paraspinal, T4-6 RLSR, T7-9 RRSL Rib: Ribs 7-9 stuck in inhalation on right Upper extremity: TTP bilateral rhomboids, levator.  Equivocal scapular motion  Electronically signed by:  Benito Mccreedy D.[facility] 11:25 AM 07/29/22

## 2022-07-29 ENCOUNTER — Other Ambulatory Visit (HOSPITAL_COMMUNITY): Payer: Self-pay

## 2022-07-29 ENCOUNTER — Ambulatory Visit: Payer: No Typology Code available for payment source | Admitting: Sports Medicine

## 2022-07-29 ENCOUNTER — Telehealth: Payer: Self-pay

## 2022-07-29 ENCOUNTER — Ambulatory Visit: Payer: No Typology Code available for payment source

## 2022-07-29 VITALS — BP 100/80 | HR 81 | Ht 70.0 in | Wt 211.0 lb

## 2022-07-29 DIAGNOSIS — G8929 Other chronic pain: Secondary | ICD-10-CM

## 2022-07-29 DIAGNOSIS — M9908 Segmental and somatic dysfunction of rib cage: Secondary | ICD-10-CM

## 2022-07-29 DIAGNOSIS — M546 Pain in thoracic spine: Secondary | ICD-10-CM

## 2022-07-29 DIAGNOSIS — M9902 Segmental and somatic dysfunction of thoracic region: Secondary | ICD-10-CM | POA: Diagnosis not present

## 2022-07-29 DIAGNOSIS — M9907 Segmental and somatic dysfunction of upper extremity: Secondary | ICD-10-CM

## 2022-07-29 NOTE — Therapy (Incomplete)
OUTPATIENT PHYSICAL THERAPY TREATMENT NOTE   Patient Name: Kent Griffith MRN: [id number] DOB:31-Jan-1996, 27 y.o., male Today's Date: 07/29/2022  PCP: Vivi Barrack, MD  REFERRING PROVIDER: Glennon Mac, DO   END OF SESSION:    Past Medical History:  Diagnosis Date   Anxiety    Depression    GERD (gastroesophageal reflux disease)    Hypothyroidism    IBS (irritable bowel syndrome)    Joint pain    Lactose intolerance    Obesity    Sleep apnea    Past Surgical History:  Procedure Laterality Date   KNEE ARTHROSCOPY, MEDIAL PATELLO FEMORAL LIGAMENT REPAIR Right    TONSILLECTOMY AND ADENOIDECTOMY     Patient Active Problem List   Diagnosis Date Noted   Scapular dyskinesis 03/04/2021   Hip flexor tightness 10/09/2020   Nonallopathic lesion of lumbar region 10/09/2020   Insomnia 09/04/2020   GAD (generalized anxiety disorder) 06/15/2020   Polyphagia, with IR 06/15/2020   Class 1 obesity due to excess calories with body mass index (BMI) of 31.0 to 31.9 in adult 02/05/2020   Subclinical hypothyroidism 09/04/2019   Arthritis of right knee 08/14/2019   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   Lactose intolerance in adult 08/14/2019   OSA (obstructive sleep apnea) 08/14/2019   GERD (gastroesophageal reflux disease) 11/10/2017   Depression, major, single episode, moderate ([place]) 11/10/2017   Anxiety 11/10/2017    REFERRING DIAG:  M54.2 (ICD-10-CM) - Neck pain  M53.3 (ICD-10-CM) - SI (sacroiliac) pain  M54.50,G89.29 (ICD-10-CM) - Chronic bilateral low back pain without sciatica  S76.011A (ICD-10-CM) - Muscle strain of right gluteal region, initial encounter  M99.01 (ICD-10-CM) - Somatic dysfunction of cervical region  M99.02 (ICD-10-CM) - Somatic dysfunction of thoracic region  M99.03 (ICD-10-CM) - Somatic dysfunction of lumbar region  M99.05 (ICD-10-CM) - Somatic dysfunction of pelvic region  M99.04 (ICD-10-CM) - Somatic dysfunction of sacral  region    THERAPY DIAG:  No diagnosis found.  Rationale for Evaluation and Treatment Rehabilitation  PERTINENT HISTORY: Anxiety, depression, sleep apnea, prior Rt MPFL repair    SUBJECTIVE:                                                                                                                                                                                      SUBJECTIVE STATEMENT:  ***   PAIN:  Are you having pain? Yes: NPRS scale: 0/10 Pain location: Neck, mid-low back, Rt glute Pain description: Achy Aggravating factors: thoracic rotation, getting out of bed in the morning, turning head, sitting > 5-10 minutes (gluteal pain) Relieving factors: positional change, walking, pain medication   OBJECTIVE: (objective measures completed at initial  evaluation unless otherwise dated)   DIAGNOSTIC FINDINGS:  N/A   PATIENT SURVEYS:  FOTO 68%, predicted 75% in 19 visits   SCREENING FOR RED FLAGS: Bowel or bladder incontinence: No Cauda equina syndrome: No   COGNITION: Overall cognitive status: Within functional limits for tasks assessed                          SENSATION: Not tested   MUSCLE LENGTH: Thomas test: Moderate limitation BIL   POSTURE: rounded shoulders, forward head, and increased lumbar lordosis   PALPATION: TTP to Lt mid trap with palpable trigger points, TTP to BIL piriformis/ mid glute max   PASSIVE ACCESSORIES: Hypomobile cervicothoracic CPAs with pain at C3-C4, C6-T4; Lumbar passive accessories hypomobile with no pain   LUMBAR ROM:    AROM eval  Flexion WNL  Extension WNL, LBP  Right lateral flexion WNL  Left lateral flexion WNL  Right rotation WNL, hip and scapular pain  Left rotation WNL, hip and scapular pain   (Blank rows = not tested)     LOWER EXTREMITY MMT:     MMT Right eval Left eval  Hip flexion 5/5 5/5  Hip extension 5/5 5/5  Hip abduction 4/5 4/5   (Blank rows = not tested)     CERVICAL ROM:    AROM A/PROM  (deg) 07/16/2022  Flexion 50  Extension 65, minor p!  Right lateral flexion 60  Left lateral flexion 60  Right rotation 80  Left rotation 80   (Blank rows = not tested)     UE MMT:   MMT Right 07/16/2022 Left 07/16/2022  Shoulder flexion 5/5 5/5  Shoulder abduction 5/5 5/5  Shoulder adduction 5/5 5/5  Shoulder internal rotation 5/5 5/5  Shoulder external rotation 5/5 5/5  Elbow flexion 5/5 5/5  Elbow extension 5/5 5/5  Middle trapezius 4/5 5/5  Lower trapezius 3/5 3+/5  Latissimus dorsi 4/5 4+/5   (Blank rows = not tested)       SPECIAL TESTS:  Cervical quadrant test: PLE: (-) Piriformis test: (+) FABER: (+) on Lt for anterior hip pain FADDIR: (+) BIL for anterior hip pain Hip scour: (-) BIL   FUNCTIONAL TESTS:  Forearm plank: 70 seconds, PT terminated DNF endurance test: 45 seconds Squat: WNL, anterior hip pain     TODAY'S TREATMENT:                                                                              [facility] Adult PT Treatment:                                                DATE: 07/29/2022 Therapeutic Exercise: *** Manual Therapy: *** Neuromuscular re-ed: *** Therapeutic Activity: *** Modalities: *** Self Care: ***      PATIENT EDUCATION:  Education details: Pt educated on potential underlying pathophysiology, POC, prognosis, FOTO, and HEP Person educated: Patient Education method: Explanation, Demonstration, and Handouts Education comprehension: verbalized understanding and returned demonstration   HOME EXERCISE PROGRAM: Access Code: [email or web address] URL: [email or web address] Date: 07/22/2022  Prepared by: Vanessa Peletier   Exercises - Seated Cervical Retraction  - 1 x daily - 7 x weekly - 3 sets - 10 reps - 5-sec hold - Standing Shoulder Row with Anchored Resistance  - 1 x daily - 7 x weekly - 3 sets - 10 reps - 3 seconds hold - Oblique Bicycle Crunch  - 1 x daily - 7 x weekly - 3 sets - 20 reps - Squat  - 1 x daily - 7 x weekly -  3 sets - 15 reps - Full Superman on Table  - 1 x daily - 7 x weekly - 3 sets - 10 reps   ASSESSMENT:   CLINICAL IMPRESSION: ***   OBJECTIVE IMPAIRMENTS: decreased endurance, decreased ROM, decreased strength, hypomobility, impaired flexibility, impaired UE functional use, improper body mechanics, postural dysfunction, and pain.    ACTIVITY LIMITATIONS: carrying, lifting, bending, sitting, standing, squatting, bed mobility, and dressing   PARTICIPATION LIMITATIONS: cleaning, laundry, driving, shopping, community activity, occupation, and yard work   PERSONAL FACTORS: 1-2 comorbidities: Anxiety, depression  are also affecting patient's functional outcome.      GOALS: Goals reviewed with patient? Yes   SHORT TERM GOALS: Target date: 08/13/2022   Pt will report understanding and adherence to initial HEP in order to promote independence in the management of primary impairments. Baseline: HEP provided at eval Goal status: INITIAL     LONG TERM GOALS: Target date: 09/10/2022   Pt will achieve a FOTO score of 75% in order to demonstrate improved functional ability as it relates to the pt's primary impairments. Baseline: 68% Goal status: INITIAL   2.  Pt will report ability to sit > 30 minutes with 0-1/10 pain in order to go on car trips with less limitation. Baseline: 4/10 pain with 5-10 minutes of sitting Goal status: INITIAL   3.  Pt will achieve WNL trunk extension AROM with 0-1/10 pain in order to get dressed with less limitation. Baseline: 25% ROM with 4/10 pain Goal status: INITIAL   4.  Pt will achieve DNF endurance test of 70 seconds in order to promote functional neck strength in the prophylaxis of future mechanical cervical pain. Baseline: 45 seconds Goal status: INITIAL     PLAN:   PT FREQUENCY: 1x/week   PT DURATION: 8 weeks   PLANNED INTERVENTIONS: Therapeutic exercises, Therapeutic activity, Neuromuscular re-education, Balance training, Gait training,  Patient/Family education, Self Care, Joint mobilization, Joint manipulation, Vestibular training, Aquatic Therapy, Dry Needling, Electrical stimulation, Spinal manipulation, Spinal mobilization, Cryotherapy, Moist heat, Taping, Vasopneumatic device, Traction, Biofeedback, Ionotophoresis 4mg /ml Dexamethasone, Manual therapy, and Re-evaluation.   PLAN FOR NEXT SESSION: Progress DNF/ parascapular/ core/ hip strengthening; OMT/ TDN PRN   Vanessa Holland, PT, DPT 07/29/22 12:37 PM

## 2022-07-29 NOTE — Telephone Encounter (Signed)
Left message for pt regarding first no show. Confirmed next scheduled appointment, discussed clinic attendance policy, and confirmed clinic phone number.

## 2022-08-02 ENCOUNTER — Other Ambulatory Visit (HOSPITAL_COMMUNITY): Payer: Self-pay

## 2022-08-05 ENCOUNTER — Ambulatory Visit: Payer: No Typology Code available for payment source

## 2022-08-10 ENCOUNTER — Other Ambulatory Visit (HOSPITAL_COMMUNITY): Payer: Self-pay

## 2022-08-13 ENCOUNTER — Ambulatory Visit: Payer: No Typology Code available for payment source

## 2022-08-19 ENCOUNTER — Ambulatory Visit: Payer: No Typology Code available for payment source | Admitting: Physical Therapy

## 2022-08-26 ENCOUNTER — Ambulatory Visit: Payer: Commercial Managed Care - PPO | Admitting: Sports Medicine

## 2022-08-26 ENCOUNTER — Other Ambulatory Visit (HOSPITAL_COMMUNITY): Payer: Self-pay

## 2022-08-26 ENCOUNTER — Other Ambulatory Visit: Payer: Self-pay

## 2022-08-27 ENCOUNTER — Other Ambulatory Visit (HOSPITAL_COMMUNITY): Payer: Self-pay

## 2022-08-27 MED ORDER — LINZESS 290 MCG PO CAPS
290.0000 ug | ORAL_CAPSULE | Freq: Every day | ORAL | 3 refills | Status: DC
  Filled 2022-08-27: qty 30, 30d supply, fill #0

## 2022-08-27 MED ORDER — LISDEXAMFETAMINE DIMESYLATE 60 MG PO CHEW
60.0000 mg | CHEWABLE_TABLET | Freq: Two times a day (BID) | ORAL | 0 refills | Status: DC
  Filled 2022-08-27: qty 30, 15d supply, fill #0

## 2022-08-29 ENCOUNTER — Other Ambulatory Visit (HOSPITAL_COMMUNITY): Payer: Self-pay

## 2022-08-30 ENCOUNTER — Other Ambulatory Visit (HOSPITAL_COMMUNITY): Payer: Self-pay

## 2022-09-06 ENCOUNTER — Other Ambulatory Visit (HOSPITAL_COMMUNITY): Payer: Self-pay

## 2022-09-06 MED ORDER — LISDEXAMFETAMINE DIMESYLATE 60 MG PO CHEW
60.0000 mg | CHEWABLE_TABLET | Freq: Every day | ORAL | 0 refills | Status: DC
  Filled 2022-09-06: qty 30, 30d supply, fill #0

## 2022-09-07 ENCOUNTER — Other Ambulatory Visit (HOSPITAL_COMMUNITY): Payer: Self-pay

## 2022-09-07 MED ORDER — PREDNISONE 10 MG PO TABS
30.0000 mg | ORAL_TABLET | Freq: Every day | ORAL | 1 refills | Status: DC
  Filled 2022-09-07: qty 30, 10d supply, fill #0

## 2022-09-11 ENCOUNTER — Other Ambulatory Visit (HOSPITAL_COMMUNITY): Payer: Self-pay

## 2022-09-28 ENCOUNTER — Other Ambulatory Visit (HOSPITAL_COMMUNITY): Payer: Self-pay

## 2022-09-29 ENCOUNTER — Other Ambulatory Visit (HOSPITAL_COMMUNITY): Payer: Self-pay

## 2022-09-29 MED ORDER — LISDEXAMFETAMINE DIMESYLATE 60 MG PO CHEW
60.0000 mg | CHEWABLE_TABLET | Freq: Every day | ORAL | 0 refills | Status: DC
  Filled 2022-10-10: qty 30, 30d supply, fill #0

## 2022-10-10 ENCOUNTER — Other Ambulatory Visit (HOSPITAL_COMMUNITY): Payer: Self-pay

## 2022-11-05 ENCOUNTER — Other Ambulatory Visit (HOSPITAL_COMMUNITY): Payer: Self-pay

## 2022-11-23 ENCOUNTER — Other Ambulatory Visit (HOSPITAL_BASED_OUTPATIENT_CLINIC_OR_DEPARTMENT_OTHER): Payer: Self-pay

## 2022-11-23 MED ORDER — LISDEXAMFETAMINE DIMESYLATE 60 MG PO CHEW
60.0000 mg | CHEWABLE_TABLET | Freq: Every day | ORAL | 0 refills | Status: DC
  Filled 2022-11-23: qty 30, 30d supply, fill #0

## 2022-12-01 ENCOUNTER — Other Ambulatory Visit (HOSPITAL_BASED_OUTPATIENT_CLINIC_OR_DEPARTMENT_OTHER): Payer: Self-pay

## 2023-01-02 ENCOUNTER — Other Ambulatory Visit (HOSPITAL_BASED_OUTPATIENT_CLINIC_OR_DEPARTMENT_OTHER): Payer: Self-pay

## 2023-01-09 ENCOUNTER — Other Ambulatory Visit (HOSPITAL_COMMUNITY): Payer: Self-pay

## 2023-02-09 ENCOUNTER — Other Ambulatory Visit (HOSPITAL_BASED_OUTPATIENT_CLINIC_OR_DEPARTMENT_OTHER): Payer: Self-pay

## 2023-02-09 MED ORDER — QULIPTA 60 MG PO TABS
60.0000 mg | ORAL_TABLET | Freq: Every day | ORAL | 1 refills | Status: DC
  Filled 2023-02-09: qty 90, 90d supply, fill #0

## 2023-02-09 MED ORDER — LISDEXAMFETAMINE DIMESYLATE 60 MG PO CHEW
60.0000 mg | CHEWABLE_TABLET | Freq: Every day | ORAL | 0 refills | Status: AC
  Filled 2023-02-09: qty 30, 30d supply, fill #0

## 2023-02-10 ENCOUNTER — Other Ambulatory Visit (HOSPITAL_BASED_OUTPATIENT_CLINIC_OR_DEPARTMENT_OTHER): Payer: Self-pay

## 2023-02-13 ENCOUNTER — Other Ambulatory Visit (HOSPITAL_BASED_OUTPATIENT_CLINIC_OR_DEPARTMENT_OTHER): Payer: Self-pay

## 2024-03-12 ENCOUNTER — Telehealth: Payer: Self-pay

## 2024-03-12 NOTE — Telephone Encounter (Signed)
 PT has appointment scheduled on 05/30/24 at 3:40 Avelina Finder, CMA

## 2024-05-30 ENCOUNTER — Ambulatory Visit: Admitting: Family Medicine

## 2024-06-03 ENCOUNTER — Ambulatory Visit: Admitting: Family Medicine

## 2024-06-03 ENCOUNTER — Encounter: Payer: Self-pay | Admitting: Family Medicine

## 2024-06-03 VITALS — BP 106/64 | HR 80 | Ht 70.0 in | Wt 186.0 lb

## 2024-06-03 DIAGNOSIS — E038 Other specified hypothyroidism: Secondary | ICD-10-CM

## 2024-06-03 DIAGNOSIS — F50819 Binge eating disorder, unspecified: Secondary | ICD-10-CM | POA: Insufficient documentation

## 2024-06-03 DIAGNOSIS — D531 Other megaloblastic anemias, not elsewhere classified: Secondary | ICD-10-CM | POA: Insufficient documentation

## 2024-06-03 DIAGNOSIS — E559 Vitamin D deficiency, unspecified: Secondary | ICD-10-CM | POA: Insufficient documentation

## 2024-06-03 DIAGNOSIS — E663 Overweight: Secondary | ICD-10-CM | POA: Insufficient documentation

## 2024-06-03 DIAGNOSIS — E739 Lactose intolerance, unspecified: Secondary | ICD-10-CM

## 2024-06-03 DIAGNOSIS — Z1589 Genetic susceptibility to other disease: Secondary | ICD-10-CM

## 2024-06-03 DIAGNOSIS — Z79899 Other long term (current) drug therapy: Secondary | ICD-10-CM

## 2024-06-03 DIAGNOSIS — K9041 Non-celiac gluten sensitivity: Secondary | ICD-10-CM

## 2024-06-03 DIAGNOSIS — Z8639 Personal history of other endocrine, nutritional and metabolic disease: Secondary | ICD-10-CM

## 2024-06-03 DIAGNOSIS — F902 Attention-deficit hyperactivity disorder, combined type: Secondary | ICD-10-CM | POA: Insufficient documentation

## 2024-06-03 DIAGNOSIS — F605 Obsessive-compulsive personality disorder: Secondary | ICD-10-CM | POA: Insufficient documentation

## 2024-06-03 DIAGNOSIS — K5903 Drug induced constipation: Secondary | ICD-10-CM | POA: Insufficient documentation

## 2024-06-04 LAB — COMPREHENSIVE METABOLIC PANEL WITH GFR
ALT: 17 U/L (ref 0–53)
AST: 22 U/L (ref 0–37)
Albumin: 4.8 g/dL (ref 3.5–5.2)
Alkaline Phosphatase: 64 U/L (ref 39–117)
BUN: 16 mg/dL (ref 6–23)
CO2: 30 meq/L (ref 19–32)
Calcium: 9.2 mg/dL (ref 8.4–10.5)
Chloride: 104 meq/L (ref 96–112)
Creatinine, Ser: 1.2 mg/dL (ref 0.40–1.50)
GFR: 81.36 mL/min (ref >60.00)
Glucose, Bld: 68 mg/dL — ABNORMAL LOW (ref 70–99)
Potassium: 4 meq/L (ref 3.5–5.1)
Sodium: 139 meq/L (ref 135–145)
Total Bilirubin: 0.8 mg/dL (ref 0.2–1.2)
Total Protein: 7 g/dL (ref 6.0–8.3)

## 2024-06-04 LAB — THYROID PANEL WITH TSH
Free Thyroxine Index: 2.1 (ref 1.4–3.8)
T3 Uptake: 29 % (ref 22–35)
T4, Total: 7.1 ug/dL (ref 4.9–10.5)
TSH: 2.32 m[IU]/L (ref 0.40–4.50)

## 2024-06-04 LAB — VITAMIN B12: Vitamin B-12: 1330 pg/mL — ABNORMAL HIGH (ref 211–911)

## 2024-06-04 NOTE — Progress Notes (Signed)
 Assessment and Plan Assessment & Plan Medication management Discussed tapering Pristiq , potential discontinuation of Qulipta . He is not currently taking Vyvanse  or Adderall but doesn't want to take those off the list yet. - Continue tapering Pristiq  as planned. - Consider discontinuing Qulipta  if headaches remain controlled. - Maintain Vyvanse  and Adderall prescriptions for potential future use.  Attention-deficit hyperactivity disorder, combined type Discussed ADHD management and potential discontinuation of Vyvanse  and Adderall.  - Maintain Vyvanse  and Adderall prescriptions for potential future use.  Binge eating disorder - Controlled at this time.  Drug-induced constipation Constipation potentially related to medication use.  - Consider Miralax for constipation management.  Vitamin B12 deficiency Vitamin B12 levels previously low, currently on methylated B12. Discussed MTHFR gene and potential need for further testing. - Ordered B12 level as part of lab work.  Obsessive-compulsive disorder OCD tendencies acknowledged.  Non-celiac gluten sensitivity and lactose intolerance Discussed potential gluten sensitivity and lactose intolerance. Symptoms include upset stomach with lactose-free milk, possibly related to casein sensitivity. - Monitor symptoms and consider dietary adjustments.  Subclinical hypothyroidism TSH levels slightly elevated but not clinically significant for treatment. - Ordered thyroid  panel including TSH as part of lab work.  Protein-calorie malabsorption Discussed potential malabsorption due to medication use and dietary factors. - Ordered lab work including CBC, CMP, and TIBC to assess for malabsorption.  Migraine prophylaxis Discussed potential discontinuation of Qulipta  due to lack of recent headaches. - Consider discontinuing Qulipta  if headaches remain controlled.  Gastroesophageal reflux disease GERD managed with omeprazole . Symptoms include  heartburn if medication is missed. - Continue omeprazole  as prescribed.  General Health Maintenance Discussed vitamin D  supplementation and exercise routine. - Continue vitamin D  supplementation once weekly. - Encouraged participation in gym activities.  Geni Shutter, DO, MS, FAAFP, Dipl. [facility] [address] [place], [address] Dept: [phone] Dept Fax: [phone]  Subjective:   Patient is well known to me from my previous clinic and is now establishing care with me as their primary care provider.  Discussed the use of AI scribe software for clinical note transcription with the patient, who gave verbal consent to proceed.  History of Present Illness Kent Griffith is a 28 year old male who presents for medication management and lab review.  Attention deficit hyperactivity disorder symptoms and stimulant use - ADHD with discontinuation of Vyvanse  and Adderall in early August - Interest in the effects of zinc and copper on ADHD symptoms  Vitamin b12 deficiency - Takes methylated B12 supplementation - Last B12 level between 300 and 400, previously over 1500 - No B12 level checked since resuming supplementation  Drug-induced constipation - Experiences constipation attributed to medication use - Uses Linzess  for management - Considering alternatives such as Miralax due to concerns about gut integrity  Antidepressant tapering and gastroesophageal reflux symptoms - Tapering off Pristiq , alternating between 75 mg and 50 mg every other day, with plans for further dose reduction - Takes omeprazole  daily - Experiences heartburn if omeprazole  dose is missed  Vitamin d  supplementation - On weekly vitamin D  supplementation - Has a six-month supply available  Thyroid  function abnormalities - Subclinical hypothyroidism with slightly elevated TSH, not meeting criteria for treatment - Last laboratory evaluation in July or August  showed abnormalities in albumin, TSH, and vitamin B12  All past medical history, surgical history, allergies, family history, immunizations andmedications were updated in the EMR today and reviewed under the history and medication portions of their EMR. Review of Systems: Negative, with  the exception of above mentioned in HPI.  Current Outpatient Medications:  .  Atogepant  (QULIPTA ) 60 MG TABS, Take 1 tablet (60 mg total) by mouth daily., Disp: 90 tablet, Rfl: 1 .  desvenlafaxine  (PRISTIQ ) 25 MG 24 hr tablet, Take 25 mg by mouth daily., Disp: , Rfl:  .  desvenlafaxine  (PRISTIQ ) 50 MG 24 hr tablet, Take 50 mg by mouth daily., Disp: , Rfl:  .  ergocalciferol  (VITAMIN D2) 1.25 MG (50000 UT) capsule, Take 1 capsule by mouth once a week., Disp: 12 capsule, Rfl: 0 .  omeprazole  (PRILOSEC) 20 MG capsule, Take 1 capsule by mouth daily., Disp: 90 capsule, Rfl: 3 .  Tirzepatide -Weight Management (ZEPBOUND ) 15 MG/0.5ML SOLN, Inject 15 mg into the skin once a week., Disp: , Rfl:  .  UNABLE TO FIND, Take 1 tablet by mouth daily. Neurobiologix Methylation Complete: Vit B6 (2mg ), Methylfolate (5440mcg DFE), & Methylcobolamin (7000mcg): Dissolve 1 tab sublingually once daily., Disp: , Rfl:  .  UNABLE TO FIND, Take 1 capsule by mouth daily. Med Name: Omega 3 (1250 mg total of fish oil & 1055mg  total of Omega 3), Disp: , Rfl:  .  UNABLE TO FIND, Take 1 capsule by mouth daily. Med Name: Pendulum Metabolic Daily, Disp: , Rfl:  .  UNABLE TO FIND, Take 2 capsules by mouth daily. Med Name: Polyphenol, Disp: , Rfl:  .  amphetamine-dextroamphetamine (ADDERALL XR) 25 MG 24 hr capsule, Take 25 mg by mouth daily. (Patient not taking: Reported on 06/03/2024), Disp: , Rfl:  .  Lisdexamfetamine Dimesylate  (VYVANSE ) 60 MG CHEW, Chew 1 tablet (60 mg total) by mouth daily. (Patient not taking: Reported on 06/03/2024), Disp: 30 tablet, Rfl: 0   Objective:   BP 106/64 (BP Location: Left Arm, Cuff Size: Normal)   Pulse 80   Ht 5'  10 (1.778 m)   Wt 186 lb (84.4 kg)   SpO2 98%   BMI 26.69 kg/m   Wt Readings from Last 3 Encounters:  06/03/24 186 lb (84.4 kg)  07/29/22 211 lb (95.7 kg)  06/10/22 202 lb (91.6 kg)   Physical Exam Vitals reviewed.  Constitutional:      General: He is not in acute distress.    Appearance: He is well-developed.  HENT:     Head: Normocephalic and atraumatic.     Right Ear: Tympanic membrane, ear canal and external ear normal.     Left Ear: Tympanic membrane, ear canal and external ear normal.  Eyes:     Conjunctiva/sclera: Conjunctivae normal.     Pupils: Pupils are equal, round, and reactive to light.  Cardiovascular:     Rate and Rhythm: Normal rate and regular rhythm.     Heart sounds: Normal heart sounds.  Pulmonary:     Effort: Pulmonary effort is normal.     Breath sounds: Normal breath sounds.  Abdominal:     General: Bowel sounds are normal.     Palpations: Abdomen is soft.  Skin:    General: Skin is warm and dry.  Neurological:     General: No focal deficit present.     Mental Status: He is alert.  Psychiatric:        Mood and Affect: Mood normal.        Behavior: Behavior normal.        Thought Content: Thought content normal.        Judgment: Judgment normal.    Lab Results  Component Value Date   CREATININE 0.90 09/01/2021   BUN  12 09/01/2021   NA 142 09/01/2021   K 4.1 09/01/2021   CL 101 09/01/2021   CO2 25 09/01/2021   Lab Results  Component Value Date   ALT 18 09/01/2021   AST 18 09/01/2021   ALKPHOS 99 09/01/2021   BILITOT 0.4 09/01/2021   Lab Results  Component Value Date   HGBA1C 4.8 09/01/2021   HGBA1C 4.8 05/19/2020   HGBA1C 4.9 05/07/2019   HGBA1C 5.0 07/12/2018   Lab Results  Component Value Date   INSULIN  2.5 (L) 01/27/2021   INSULIN  4.5 05/19/2020   INSULIN  12.3 05/07/2019   Lab Results  Component Value Date   TSH 2.32 06/03/2024   Lab Results  Component Value Date   CHOL 152 01/27/2021   HDL 56 01/27/2021    LDLCALC 79 01/27/2021   LDLDIRECT 99.0 07/12/2018   TRIG 94 01/27/2021   CHOLHDL 2.7 01/27/2021   Lab Results  Component Value Date   VD25OH 40.7 09/01/2021   VD25OH 71.8 01/27/2021   VD25OH 82.8 05/19/2020   Lab Results  Component Value Date   WBC 5.1 09/01/2021   HGB 15.1 09/01/2021   HCT 46.1 09/01/2021   MCV 84 09/01/2021   PLT 201 09/01/2021   Lab Results  Component Value Date   IRON 93 12/11/2019   TIBC 261 12/11/2019   FERRITIN 78 12/11/2019

## 2024-06-05 ENCOUNTER — Other Ambulatory Visit: Payer: Self-pay

## 2024-06-05 DIAGNOSIS — F321 Major depressive disorder, single episode, moderate: Secondary | ICD-10-CM

## 2024-06-05 DIAGNOSIS — K219 Gastro-esophageal reflux disease without esophagitis: Secondary | ICD-10-CM

## 2024-06-05 MED ORDER — DESVENLAFAXINE SUCCINATE ER 50 MG PO TB24: 50.0000 mg | ORAL_TABLET | Freq: Every day | ORAL | 1 refills | Status: AC

## 2024-06-05 MED ORDER — DESVENLAFAXINE SUCCINATE ER 25 MG PO TB24: 25.0000 mg | ORAL_TABLET | Freq: Every day | ORAL | 1 refills | Status: AC

## 2024-06-05 MED ORDER — OMEPRAZOLE 20 MG PO CPDR
20.0000 mg | DELAYED_RELEASE_CAPSULE | Freq: Every day | ORAL | 1 refills | Status: AC
Start: 2024-06-05 — End: ?

## 2024-06-05 NOTE — Telephone Encounter (Signed)
 Pt called in this afternoon requesting that both doses of his Pristiq , and his Omeprazole  20mg  be sent to the pharmacy. He forgot to request these be refilled at his appt this past Monday. All 3 prescriptions have been sent. Pt notified.

## 2024-06-10 ENCOUNTER — Other Ambulatory Visit: Payer: Self-pay

## 2024-06-10 ENCOUNTER — Encounter: Payer: Self-pay | Admitting: Family Medicine

## 2024-06-10 NOTE — Telephone Encounter (Signed)
 Opened encounter in error

## 2024-06-25 MED ORDER — AZITHROMYCIN 250 MG PO TABS: ORAL_TABLET | ORAL | 0 refills | Status: AC

## 2024-07-08 ENCOUNTER — Ambulatory Visit: Admitting: Family Medicine

## 2024-07-08 VITALS — BP 122/72 | HR 78 | Temp 98.2°F | Ht 70.0 in | Wt 188.8 lb

## 2024-07-08 DIAGNOSIS — F411 Generalized anxiety disorder: Secondary | ICD-10-CM

## 2024-07-08 DIAGNOSIS — R632 Polyphagia: Secondary | ICD-10-CM

## 2024-07-08 DIAGNOSIS — E559 Vitamin D deficiency, unspecified: Secondary | ICD-10-CM

## 2024-07-08 DIAGNOSIS — K5903 Drug induced constipation: Secondary | ICD-10-CM

## 2024-07-10 NOTE — Progress Notes (Signed)
 "   Patient Care Team: Prentiss Frieze, DO as PCP - General (Family Medicine)   Assessment & Plan:   1. Polyphagia, with IR (Primary) Not at goal. Currently taking Zepbound . Felt that Mounjaro  was more helpful. Previously on stimulant for ADHD as well. Encouraged protein at each meal, eating at regular intervals, stress reduction.  2. Vitamin D  deficiency Will obtain labs at next visit.  3. GAD (generalized anxiety disorder) Stable. Continue to SLOWLY wean Pristiq  as tolerated.   4. Drug-induced constipation Counseling: Getting to Good Bowel Health: Your goal is to have one soft bowel movement each day. Drink at least 8 glasses of water each day. Eat plenty of fiber (goal is over 30 grams each day). It is best to get most of your fiber from dietary sources which includes leafy green vegetables, fresh fruit, and whole grains. You may need to add fiber with the help of OTC fiber supplements. These include Metamucil, Citrucel, and Benefiber. If you are still having trouble, try adding an osmotic laxative such as Miralax. If all of these changes do not work, dietitian.   Frieze Prentiss, DO, MS, FAAFP, Dipl. [facility] [address] [place], [address] Dept: [phone] Dept Fax: [phone]  Subjective:   Review of Systems: Negative, with the exception of above mentioned in HPI.  History:   Reviewed by clinician on day of visit: allergies, medications, problem list, medical history, surgical history, family history, social history, and previous encounter notes.    Medications:   Show/hide medication list[1] Allergies[2]  Objective:   BP 122/72 (BP Location: Left Arm, Patient Position: Sitting, Cuff Size: Normal)   Pulse 78   Temp 98.2 F (36.8 C) (Oral)   Ht 5' 10 (1.778 m)   Wt 188 lb 12.8 oz (85.6 kg)   SpO2 99%   BMI 27.09 kg/m    Physical Exam Vitals and nursing note reviewed.  Constitutional:       General: He is not in acute distress.    Appearance: He is well-developed.  HENT:     Head: Normocephalic and atraumatic.     Right Ear: External ear normal.     Left Ear: External ear normal.     Nose: Nose normal.  Eyes:     Conjunctiva/sclera: Conjunctivae normal.     Pupils: Pupils are equal, round, and reactive to light.  Cardiovascular:     Rate and Rhythm: Normal rate and regular rhythm.  Pulmonary:     Effort: Pulmonary effort is normal.  Abdominal:     General: Bowel sounds are normal.     Palpations: Abdomen is soft.  Musculoskeletal:        General: Normal range of motion.     Cervical back: Neck supple.  Skin:    General: Skin is warm.  Neurological:     Mental Status: He is alert.  Psychiatric:        Behavior: Behavior normal.       Results for orders placed or performed in visit on 06/03/24  Vitamin B12   Collection Time: 06/03/24  4:07 PM  Result Value Ref Range   Vitamin B-12 1,330 (H) 211 - 911 pg/mL  Comp Met (CMET)   Collection Time: 06/03/24  4:19 PM  Result Value Ref Range   Sodium 139 135 - 145 mEq/L   Potassium 4.0 3.5 - 5.1 mEq/L   Chloride 104 96 - 112 mEq/L   CO2 30 19 - 32  mEq/L   Glucose, Bld 68 (L) 70 - 99 mg/dL   BUN 16 6 - 23 mg/dL   Creatinine, Ser 8.79 0.40 - 1.50 mg/dL   Total Bilirubin 0.8 0.2 - 1.2 mg/dL   Alkaline Phosphatase 64 39 - 117 U/L   AST 22 0 - 37 U/L   ALT 17 0 - 53 U/L   Total Protein 7.0 6.0 - 8.3 g/dL   Albumin 4.8 3.5 - 5.2 g/dL   GFR 18.63 >39.99 mL/min   Calcium 9.2 8.4 - 10.5 mg/dL  Thyroid  Panel With TSH   Collection Time: 06/03/24  4:19 PM  Result Value Ref Range   T3 Uptake 29 22 - 35 %   T4, Total 7.1 4.9 - 10.5 mcg/dL   Free Thyroxine Index 2.1 1.4 - 3.8   TSH 2.32 0.40 - 4.50 mIU/L    Attestations:   Reviewed by clinician on day of visit: allergies, medications, problem list, medical history, surgical history, family history, social history, and previous encounter notes.     [1]   Outpatient Medications Prior to Visit  Medication Sig Note   amphetamine-dextroamphetamine (ADDERALL XR) 25 MG 24 hr capsule Take 25 mg by mouth daily. (Patient taking differently: Take 25 mg by mouth as needed.) 07/08/2024: Takes when needed    Atogepant  (QULIPTA ) 60 MG TABS Take 1 tablet (60 mg total) by mouth daily.    desvenlafaxine  (PRISTIQ ) 25 MG 24 hr tablet Take 1 tablet (25 mg total) by mouth daily.    desvenlafaxine  (PRISTIQ ) 50 MG 24 hr tablet Take 1 tablet (50 mg total) by mouth daily.    ergocalciferol  (VITAMIN D2) 1.25 MG (50000 UT) capsule Take 1 capsule by mouth once a week.    Lisdexamfetamine  Dimesylate (VYVANSE ) 60 MG CHEW Chew 1 tablet (60 mg total) by mouth daily. (Patient taking differently: Chew 60 mg by mouth as needed.) 07/08/2024: Take when needed   omeprazole  (PRILOSEC) 20 MG capsule Take 1 capsule by mouth daily.    Tirzepatide -Weight Management (ZEPBOUND ) 15 MG/0.5ML SOLN Inject 15 mg into the skin once a week.    UNABLE TO FIND Take 1 tablet by mouth daily. Neurobiologix Methylation Complete: Vit B6 (2mg ), Methylfolate (5440mcg DFE), & Methylcobolamin (7000mcg): Dissolve 1 tab sublingually once daily.    UNABLE TO FIND Take 1 capsule by mouth daily. Med Name: Omega 3 (1250 mg total of fish oil & 1055mg  total of Omega 3)    UNABLE TO FIND Take 1 capsule by mouth daily. Med Name: Pendulum Metabolic Daily    VITAMIN K PO Take 1 capsule by mouth daily. 6000 mcg -90 mcg    ZINC PICOLINATE, ZINC, Take 15 mg by mouth daily at 6 (six) AM.    [DISCONTINUED] UNABLE TO FIND Take 2 capsules by mouth daily. Med Name: Polyphenol (Patient not taking: Reported on 07/08/2024)    No facility-administered medications prior to visit.  [2]  Allergies Allergen Reactions   Bee Venom Anaphylaxis   Penicillins Hives, Itching, Rash and Other (See Comments)    NO ANAPHYLAXIS    "

## 2024-07-15 ENCOUNTER — Telehealth: Payer: Self-pay

## 2024-07-15 DIAGNOSIS — Z3182 Encounter for Rh incompatibility status: Secondary | ICD-10-CM

## 2024-07-15 DIAGNOSIS — E559 Vitamin D deficiency, unspecified: Secondary | ICD-10-CM

## 2024-07-15 NOTE — Telephone Encounter (Signed)
 Copied from CRM [id number]. Topic: Clinical - Request for Lab/Test Order >> Jul 12, 2024  3:24 PM Maisie C wrote: Reason for CRM: pt called & stated that he was supposed to have repeat labs ordered to schedule an appt before his next f/u appt with pcp. Please call pt when orders are placed for scheduling.

## 2024-07-19 NOTE — Telephone Encounter (Signed)
 I have entered the order for Vitamin D  in future. Message sent to pt letting him know to call and schedule an appt.

## 2024-07-25 ENCOUNTER — Other Ambulatory Visit (INDEPENDENT_AMBULATORY_CARE_PROVIDER_SITE_OTHER)

## 2024-07-25 DIAGNOSIS — E559 Vitamin D deficiency, unspecified: Secondary | ICD-10-CM

## 2024-07-25 DIAGNOSIS — Z3182 Encounter for Rh incompatibility status: Secondary | ICD-10-CM

## 2024-07-25 NOTE — Addendum Note (Signed)
 Addended by: Taraneh Metheney on: 07/25/2024 01:54 PM   Modules accepted: Orders

## 2024-07-25 NOTE — Telephone Encounter (Signed)
 Pt is coming in today for lab appt at 340pm. Appt has been scheduled. Pt is needing to have his blood type checked. Okay per Dr Prentiss. Lab orders are in future.

## 2024-07-26 LAB — ABO/RH: Rh Factor: NEGATIVE

## 2024-07-26 LAB — VITAMIN D 25 HYDROXY (VIT D DEFICIENCY, FRACTURES): VITD: 88.62 ng/mL (ref 30.00–100.00)

## 2024-07-29 ENCOUNTER — Ambulatory Visit: Admitting: Family Medicine

## 2024-07-31 ENCOUNTER — Ambulatory Visit: Admitting: Family Medicine

## 2024-07-31 ENCOUNTER — Encounter: Payer: Self-pay | Admitting: Family Medicine

## 2024-07-31 VITALS — BP 112/66 | HR 71 | Ht 70.0 in | Wt 185.2 lb

## 2024-07-31 DIAGNOSIS — F605 Obsessive-compulsive personality disorder: Secondary | ICD-10-CM

## 2024-07-31 DIAGNOSIS — K5903 Drug induced constipation: Secondary | ICD-10-CM

## 2024-07-31 DIAGNOSIS — R632 Polyphagia: Secondary | ICD-10-CM

## 2024-08-09 ENCOUNTER — Encounter: Payer: Self-pay | Admitting: Family Medicine

## 2024-08-09 MED ORDER — QULIPTA 60 MG PO TABS: 60.0000 mg | ORAL_TABLET | Freq: Every day | ORAL | 1 refills | Status: AC

## 2024-09-05 ENCOUNTER — Ambulatory Visit: Admitting: Family Medicine
